# Patient Record
Sex: Male | Born: 1940
Health system: Southern US, Community
[De-identification: ages and names within clinical notes are randomized; demographics above are authoritative.]

## PROBLEM LIST (undated history)

## (undated) ENCOUNTER — Emergency Department (HOSPITAL_BASED_OUTPATIENT_CLINIC_OR_DEPARTMENT_OTHER): Admission: EM | Discharge status: Against Medical Advice | Payer: Self-pay

## (undated) DIAGNOSIS — F32A Depression, unspecified: Secondary | ICD-10-CM

## (undated) DIAGNOSIS — M5030 Other cervical disc degeneration, unspecified cervical region: Secondary | ICD-10-CM

## (undated) DIAGNOSIS — M1990 Unspecified osteoarthritis, unspecified site: Secondary | ICD-10-CM

## (undated) DIAGNOSIS — K219 Gastro-esophageal reflux disease without esophagitis: Secondary | ICD-10-CM

## (undated) DIAGNOSIS — Z9289 Personal history of other medical treatment: Secondary | ICD-10-CM

## (undated) DIAGNOSIS — I4892 Unspecified atrial flutter: Secondary | ICD-10-CM

## (undated) DIAGNOSIS — I10 Essential (primary) hypertension: Secondary | ICD-10-CM

## (undated) DIAGNOSIS — G4700 Insomnia, unspecified: Secondary | ICD-10-CM

## (undated) DIAGNOSIS — R5381 Other malaise: Secondary | ICD-10-CM

## (undated) DIAGNOSIS — J301 Allergic rhinitis due to pollen: Secondary | ICD-10-CM

## (undated) DIAGNOSIS — R9431 Abnormal electrocardiogram [ECG] [EKG]: Secondary | ICD-10-CM

## (undated) DIAGNOSIS — N200 Calculus of kidney: Secondary | ICD-10-CM

## (undated) DIAGNOSIS — K589 Irritable bowel syndrome without diarrhea: Secondary | ICD-10-CM

## (undated) DIAGNOSIS — R21 Rash and other nonspecific skin eruption: Secondary | ICD-10-CM

## (undated) DIAGNOSIS — M62838 Other muscle spasm: Secondary | ICD-10-CM

## (undated) DIAGNOSIS — F329 Major depressive disorder, single episode, unspecified: Secondary | ICD-10-CM

## (undated) DIAGNOSIS — E669 Obesity, unspecified: Secondary | ICD-10-CM

## (undated) DIAGNOSIS — R5383 Other fatigue: Secondary | ICD-10-CM

## (undated) DIAGNOSIS — A0472 Enterocolitis due to Clostridium difficile, not specified as recurrent: Secondary | ICD-10-CM

## (undated) DIAGNOSIS — IMO0002 Reserved for concepts with insufficient information to code with codable children: Secondary | ICD-10-CM

## (undated) DIAGNOSIS — M25579 Pain in unspecified ankle and joints of unspecified foot: Secondary | ICD-10-CM

## (undated) DIAGNOSIS — F411 Generalized anxiety disorder: Secondary | ICD-10-CM

## (undated) DIAGNOSIS — M542 Cervicalgia: Secondary | ICD-10-CM

## (undated) DIAGNOSIS — M25559 Pain in unspecified hip: Secondary | ICD-10-CM

## (undated) DIAGNOSIS — R942 Abnormal results of pulmonary function studies: Secondary | ICD-10-CM

## (undated) DIAGNOSIS — M25519 Pain in unspecified shoulder: Secondary | ICD-10-CM

## (undated) DIAGNOSIS — T7840XA Allergy, unspecified, initial encounter: Secondary | ICD-10-CM

## (undated) DIAGNOSIS — K4090 Unilateral inguinal hernia, without obstruction or gangrene, not specified as recurrent: Secondary | ICD-10-CM

## (undated) DIAGNOSIS — N529 Male erectile dysfunction, unspecified: Secondary | ICD-10-CM

## (undated) DIAGNOSIS — M545 Low back pain: Secondary | ICD-10-CM

## (undated) DIAGNOSIS — M461 Sacroiliitis, not elsewhere classified: Secondary | ICD-10-CM

## (undated) DIAGNOSIS — H9190 Unspecified hearing loss, unspecified ear: Secondary | ICD-10-CM

## (undated) DIAGNOSIS — M25569 Pain in unspecified knee: Secondary | ICD-10-CM

## (undated) DIAGNOSIS — I498 Other specified cardiac arrhythmias: Secondary | ICD-10-CM

## (undated) DIAGNOSIS — K5900 Constipation, unspecified: Secondary | ICD-10-CM

## (undated) DIAGNOSIS — Z125 Encounter for screening for malignant neoplasm of prostate: Secondary | ICD-10-CM

## (undated) DIAGNOSIS — I2510 Atherosclerotic heart disease of native coronary artery without angina pectoris: Secondary | ICD-10-CM

## (undated) DIAGNOSIS — G609 Hereditary and idiopathic neuropathy, unspecified: Secondary | ICD-10-CM

## (undated) HISTORY — DX: Encounter for screening for malignant neoplasm of prostate: Z12.5

## (undated) HISTORY — DX: Major depressive disorder, single episode, unspecified: F32.9

## (undated) HISTORY — DX: Enterocolitis due to Clostridium difficile, not specified as recurrent: A04.72

## (undated) HISTORY — DX: Unspecified osteoarthritis, unspecified site: M19.90

## (undated) HISTORY — DX: Allergy, unspecified, initial encounter: T78.40XA

## (undated) HISTORY — DX: Low back pain: M54.5

## (undated) HISTORY — DX: Pain in unspecified hip: M25.559

## (undated) HISTORY — DX: Depression, unspecified: F32.A

## (undated) HISTORY — DX: Other cervical disc degeneration, unspecified cervical region: M50.30

## (undated) HISTORY — DX: Other fatigue: R53.83

## (undated) HISTORY — DX: Personal history of other medical treatment: Z92.89

## (undated) HISTORY — DX: Atherosclerotic heart disease of native coronary artery without angina pectoris: I25.10

## (undated) HISTORY — DX: Irritable bowel syndrome, unspecified: K58.9

## (undated) HISTORY — DX: Abnormal results of pulmonary function studies: R94.2

## (undated) HISTORY — PX: CARDIAC CATHETERIZATION: SHX172

## (undated) HISTORY — DX: Cervicalgia: M54.2

## (undated) HISTORY — DX: Rash and other nonspecific skin eruption: R21

## (undated) HISTORY — DX: Pain in unspecified knee: M25.569

## (undated) HISTORY — DX: Pain in unspecified shoulder: M25.519

## (undated) HISTORY — PX: TONSILLECTOMY: SUR1361

## (undated) HISTORY — DX: Insomnia, unspecified: G47.00

## (undated) HISTORY — PX: OTHER SURGICAL HISTORY: SHX169

## (undated) HISTORY — DX: Pain in unspecified ankle and joints of unspecified foot: M25.579

## (undated) HISTORY — DX: Reserved for concepts with insufficient information to code with codable children: IMO0002

## (undated) HISTORY — DX: Generalized anxiety disorder: F41.1

## (undated) HISTORY — DX: Sacroiliitis, not elsewhere classified: M46.1

## (undated) HISTORY — DX: Hereditary and idiopathic neuropathy, unspecified: G60.9

## (undated) HISTORY — DX: Essential (primary) hypertension: I10

## (undated) HISTORY — DX: Obesity, unspecified: E66.9

## (undated) HISTORY — DX: Male erectile dysfunction, unspecified: N52.9

## (undated) HISTORY — DX: Calculus of kidney: N20.0

## (undated) HISTORY — DX: Other specified cardiac arrhythmias: I49.8

## (undated) HISTORY — DX: Unspecified atrial flutter: I48.92

## (undated) HISTORY — PX: RENAL CYST EXCISION: SUR506

## (undated) HISTORY — DX: Unspecified hearing loss, unspecified ear: H91.90

## (undated) HISTORY — PX: APPENDECTOMY: SHX54

## (undated) HISTORY — DX: Other muscle spasm: M62.838

## (undated) HISTORY — DX: Unilateral inguinal hernia, without obstruction or gangrene, not specified as recurrent: K40.90

## (undated) HISTORY — DX: Abnormal electrocardiogram (ECG) (EKG): R94.31

## (undated) HISTORY — DX: Constipation, unspecified: K59.00

## (undated) HISTORY — DX: Gastro-esophageal reflux disease without esophagitis: K21.9

## (undated) HISTORY — DX: Allergic rhinitis due to pollen: J30.1

## (undated) HISTORY — DX: Other malaise: R53.81

## (undated) HISTORY — PX: TOTAL HIP ARTHROPLASTY: SHX124

---

## 1967-06-01 HISTORY — PX: SPINE SURGERY: SHX786

## 1987-06-01 HISTORY — PX: POSTERIOR LUMBAR FUSION: SHX6036

## 1997-09-02 ENCOUNTER — Other Ambulatory Visit: Admission: RE | Admit: 1997-09-02 | Discharge: 1997-09-02 | Payer: Self-pay

## 1997-10-01 ENCOUNTER — Other Ambulatory Visit: Admission: RE | Admit: 1997-10-01 | Discharge: 1997-10-01 | Payer: Self-pay

## 1999-08-31 ENCOUNTER — Encounter: Payer: Self-pay | Admitting: Emergency Medicine

## 1999-08-31 ENCOUNTER — Inpatient Hospital Stay (HOSPITAL_COMMUNITY): Admission: EM | Admit: 1999-08-31 | Discharge: 1999-09-01 | Payer: Self-pay | Admitting: Emergency Medicine

## 2002-05-31 HISTORY — PX: CARPAL TUNNEL WITH CUBITAL TUNNEL: SHX5608

## 2004-07-17 ENCOUNTER — Inpatient Hospital Stay (HOSPITAL_COMMUNITY): Admission: EM | Admit: 2004-07-17 | Discharge: 2004-07-22 | Payer: Self-pay | Admitting: *Deleted

## 2005-03-01 ENCOUNTER — Encounter: Admission: RE | Admit: 2005-03-01 | Discharge: 2005-03-01 | Payer: Self-pay | Admitting: Internal Medicine

## 2005-05-26 ENCOUNTER — Encounter: Admission: RE | Admit: 2005-05-26 | Discharge: 2005-05-26 | Payer: Self-pay | Admitting: *Deleted

## 2005-07-24 ENCOUNTER — Emergency Department (HOSPITAL_COMMUNITY): Admission: EM | Admit: 2005-07-24 | Discharge: 2005-07-24 | Payer: Self-pay | Admitting: Emergency Medicine

## 2008-05-17 ENCOUNTER — Emergency Department (HOSPITAL_COMMUNITY): Admission: EM | Admit: 2008-05-17 | Discharge: 2008-05-17 | Payer: Self-pay | Admitting: Emergency Medicine

## 2008-06-06 ENCOUNTER — Ambulatory Visit (HOSPITAL_COMMUNITY): Admission: RE | Admit: 2008-06-06 | Discharge: 2008-06-06 | Payer: Self-pay | Admitting: *Deleted

## 2008-06-06 HISTORY — PX: ESOPHAGOGASTRODUODENOSCOPY: SHX1529

## 2008-06-06 HISTORY — PX: COLONOSCOPY: SHX174

## 2008-07-25 ENCOUNTER — Emergency Department (HOSPITAL_COMMUNITY): Admission: EM | Admit: 2008-07-25 | Discharge: 2008-07-25 | Payer: Self-pay | Admitting: Emergency Medicine

## 2008-11-04 ENCOUNTER — Encounter: Admission: RE | Admit: 2008-11-04 | Discharge: 2008-11-04 | Payer: Self-pay | Admitting: Internal Medicine

## 2008-11-24 ENCOUNTER — Emergency Department (HOSPITAL_BASED_OUTPATIENT_CLINIC_OR_DEPARTMENT_OTHER): Admission: EM | Admit: 2008-11-24 | Discharge: 2008-11-24 | Payer: Self-pay | Admitting: Emergency Medicine

## 2008-11-24 ENCOUNTER — Ambulatory Visit: Payer: Self-pay | Admitting: Diagnostic Radiology

## 2009-05-09 ENCOUNTER — Encounter: Admission: RE | Admit: 2009-05-09 | Discharge: 2009-05-09 | Payer: Self-pay | Admitting: Internal Medicine

## 2009-05-27 ENCOUNTER — Encounter: Admission: RE | Admit: 2009-05-27 | Discharge: 2009-05-27 | Payer: Self-pay | Admitting: Internal Medicine

## 2009-07-25 ENCOUNTER — Ambulatory Visit (HOSPITAL_COMMUNITY): Admission: RE | Admit: 2009-07-25 | Discharge: 2009-07-25 | Payer: Self-pay | Admitting: Gastroenterology

## 2010-05-29 ENCOUNTER — Encounter
Admission: RE | Admit: 2010-05-29 | Discharge: 2010-05-29 | Payer: Self-pay | Source: Home / Self Care | Attending: Internal Medicine | Admitting: Internal Medicine

## 2010-06-05 ENCOUNTER — Encounter
Admission: RE | Admit: 2010-06-05 | Discharge: 2010-06-05 | Payer: Self-pay | Source: Home / Self Care | Attending: Orthopaedic Surgery | Admitting: Orthopaedic Surgery

## 2010-06-21 ENCOUNTER — Encounter: Payer: Self-pay | Admitting: Internal Medicine

## 2010-06-21 ENCOUNTER — Encounter: Payer: Self-pay | Admitting: Gastroenterology

## 2010-07-01 ENCOUNTER — Encounter: Payer: Self-pay | Admitting: Internal Medicine

## 2010-08-09 ENCOUNTER — Emergency Department (HOSPITAL_COMMUNITY): Payer: Medicare Other

## 2010-08-09 ENCOUNTER — Inpatient Hospital Stay (HOSPITAL_COMMUNITY)
Admission: EM | Admit: 2010-08-09 | Discharge: 2010-08-12 | DRG: 373 | Disposition: A | Discharge status: Home / Self Care | Payer: Medicare Other | Attending: Internal Medicine | Admitting: Internal Medicine

## 2010-08-09 ENCOUNTER — Inpatient Hospital Stay (HOSPITAL_COMMUNITY): Payer: Medicare Other

## 2010-08-09 DIAGNOSIS — R9431 Abnormal electrocardiogram [ECG] [EKG]: Secondary | ICD-10-CM | POA: Diagnosis present

## 2010-08-09 DIAGNOSIS — H9190 Unspecified hearing loss, unspecified ear: Secondary | ICD-10-CM | POA: Diagnosis present

## 2010-08-09 DIAGNOSIS — Z79899 Other long term (current) drug therapy: Secondary | ICD-10-CM

## 2010-08-09 DIAGNOSIS — I10 Essential (primary) hypertension: Secondary | ICD-10-CM | POA: Diagnosis present

## 2010-08-09 DIAGNOSIS — K219 Gastro-esophageal reflux disease without esophagitis: Secondary | ICD-10-CM | POA: Diagnosis present

## 2010-08-09 DIAGNOSIS — E860 Dehydration: Secondary | ICD-10-CM | POA: Diagnosis present

## 2010-08-09 DIAGNOSIS — E785 Hyperlipidemia, unspecified: Secondary | ICD-10-CM | POA: Diagnosis present

## 2010-08-09 DIAGNOSIS — A0472 Enterocolitis due to Clostridium difficile, not specified as recurrent: Principal | ICD-10-CM | POA: Diagnosis present

## 2010-08-09 DIAGNOSIS — M1990 Unspecified osteoarthritis, unspecified site: Secondary | ICD-10-CM | POA: Diagnosis present

## 2010-08-09 LAB — URINALYSIS, ROUTINE W REFLEX MICROSCOPIC
Bilirubin Urine: NEGATIVE
Glucose, UA: NEGATIVE mg/dL
Hgb urine dipstick: NEGATIVE
Protein, ur: NEGATIVE mg/dL
Urobilinogen, UA: 0.2 mg/dL (ref 0.0–1.0)

## 2010-08-09 LAB — COMPREHENSIVE METABOLIC PANEL
AST: 31 U/L (ref 0–37)
BUN: 18 mg/dL (ref 6–23)
CO2: 25 mEq/L (ref 19–32)
Calcium: 9.3 mg/dL (ref 8.4–10.5)
Chloride: 107 mEq/L (ref 96–112)
Creatinine, Ser: 1.3 mg/dL (ref 0.4–1.5)
GFR calc Af Amer: >60 mL/min (ref >60)
GFR calc non Af Amer: 55 mL/min — ABNORMAL LOW (ref >60)
Total Bilirubin: 0.8 mg/dL (ref 0.3–1.2)

## 2010-08-09 LAB — DIFFERENTIAL
Basophils Relative: 0 % (ref 0–1)
Eosinophils Absolute: 0.1 10*3/uL (ref 0.0–0.7)
Lymphs Abs: 0.3 10*3/uL — ABNORMAL LOW (ref 0.7–4.0)
Monocytes Absolute: 0.6 10*3/uL (ref 0.1–1.0)
Monocytes Relative: 5 % (ref 3–12)

## 2010-08-09 LAB — CBC
Hemoglobin: 13.9 g/dL (ref 13.0–17.0)
MCH: 29.8 pg (ref 26.0–34.0)
MCHC: 32.5 g/dL (ref 30.0–36.0)
MCV: 91.8 fL (ref 78.0–100.0)
Platelets: 182 10*3/uL (ref 150–400)

## 2010-08-10 ENCOUNTER — Inpatient Hospital Stay (HOSPITAL_COMMUNITY): Payer: Medicare Other

## 2010-08-10 LAB — MAGNESIUM: Magnesium: 2 mg/dL (ref 1.5–2.5)

## 2010-08-10 LAB — COMPREHENSIVE METABOLIC PANEL
Alkaline Phosphatase: 59 U/L (ref 39–117)
BUN: 20 mg/dL (ref 6–23)
GFR calc non Af Amer: 53 mL/min — ABNORMAL LOW (ref >60)
Glucose, Bld: 97 mg/dL (ref 70–99)
Potassium: 3.6 mEq/L (ref 3.5–5.1)
Total Bilirubin: 0.8 mg/dL (ref 0.3–1.2)
Total Protein: 5.6 g/dL — ABNORMAL LOW (ref 6.0–8.3)

## 2010-08-10 LAB — LIPID PANEL
Cholesterol: 123 mg/dL (ref 0–200)
LDL Cholesterol: 66 mg/dL (ref 0–99)
Total CHOL/HDL Ratio: 2.8 RATIO

## 2010-08-10 LAB — CARDIAC PANEL(CRET KIN+CKTOT+MB+TROPI)
Relative Index: INVALID (ref 0.0–2.5)
Total CK: 58 U/L (ref 7–232)

## 2010-08-10 LAB — CBC
HCT: 37.8 % — ABNORMAL LOW (ref 39.0–52.0)
Hemoglobin: 12.3 g/dL — ABNORMAL LOW (ref 13.0–17.0)
MCH: 29.7 pg (ref 26.0–34.0)
MCHC: 32.5 g/dL (ref 30.0–36.0)

## 2010-08-10 LAB — DIFFERENTIAL
Basophils Absolute: 0 10*3/uL (ref 0.0–0.1)
Lymphocytes Relative: 3 % — ABNORMAL LOW (ref 12–46)
Lymphs Abs: 0.2 10*3/uL — ABNORMAL LOW (ref 0.7–4.0)
Monocytes Absolute: 0.4 10*3/uL (ref 0.1–1.0)
Monocytes Relative: 6 % (ref 3–12)
Neutro Abs: 6.4 10*3/uL (ref 1.7–7.7)

## 2010-08-10 LAB — HEMOGLOBIN A1C: Mean Plasma Glucose: 120 mg/dL — ABNORMAL HIGH (ref <117)

## 2010-08-10 LAB — PHOSPHORUS: Phosphorus: 2.8 mg/dL (ref 2.3–4.6)

## 2010-08-10 LAB — GLUCOSE, CAPILLARY: Glucose-Capillary: 101 mg/dL — ABNORMAL HIGH (ref 70–99)

## 2010-08-11 LAB — OVA AND PARASITE EXAMINATION: Ova and parasites: NONE SEEN

## 2010-08-11 LAB — BASIC METABOLIC PANEL
Calcium: 7.6 mg/dL — ABNORMAL LOW (ref 8.4–10.5)
Creatinine, Ser: 1.07 mg/dL (ref 0.4–1.5)
GFR calc Af Amer: >60 mL/min (ref >60)
GFR calc non Af Amer: >60 mL/min (ref >60)

## 2010-08-11 LAB — FECAL LACTOFERRIN, QUANT

## 2010-08-11 LAB — CLOSTRIDIUM DIFFICILE BY PCR

## 2010-08-12 LAB — COMPREHENSIVE METABOLIC PANEL
Albumin: 2.9 g/dL — ABNORMAL LOW (ref 3.5–5.2)
Alkaline Phosphatase: 51 U/L (ref 39–117)
BUN: 8 mg/dL (ref 6–23)
Calcium: 7.9 mg/dL — ABNORMAL LOW (ref 8.4–10.5)
Glucose, Bld: 110 mg/dL — ABNORMAL HIGH (ref 70–99)
Potassium: 3.2 mEq/L — ABNORMAL LOW (ref 3.5–5.1)
Total Protein: 5 g/dL — ABNORMAL LOW (ref 6.0–8.3)

## 2010-08-12 LAB — DIFFERENTIAL
Basophils Absolute: 0 10*3/uL (ref 0.0–0.1)
Eosinophils Absolute: 0 10*3/uL (ref 0.0–0.7)
Eosinophils Relative: 1 % (ref 0–5)
Lymphs Abs: 0.8 10*3/uL (ref 0.7–4.0)
Monocytes Absolute: 0.8 10*3/uL (ref 0.1–1.0)

## 2010-08-12 LAB — CBC
MCHC: 32.5 g/dL (ref 30.0–36.0)
MCV: 90.5 fL (ref 78.0–100.0)
Platelets: 115 10*3/uL — ABNORMAL LOW (ref 150–400)
RDW: 13.6 % (ref 11.5–15.5)
WBC: 6.4 10*3/uL (ref 4.0–10.5)

## 2010-08-12 LAB — MAGNESIUM: Magnesium: 2 mg/dL (ref 1.5–2.5)

## 2010-08-14 LAB — STOOL CULTURE

## 2010-08-18 NOTE — Discharge Summary (Signed)
[name] [name], FERGUSSON                 ACCOUNT NO.:  [id number]  MEDICAL RECORD NO.:  [id number]           PATIENT TYPE:  I  LOCATION:  1509                         FACILITY:  [facility]  PHYSICIAN:  Erick Blinks, MD     DATE OF BIRTH:  December 02, 1940  DATE OF ADMISSION:  08/09/2010 DATE OF DISCHARGE:  08/12/2010                              DISCHARGE SUMMARY   PRIMARY CARE PHYSICIAN:  Lenon Curt. Chilton Si, M.D.  DISCHARGE DIAGNOSES: 1. Clostridium difficile colitis, improved. 2. Diarrhea secondary to Clostridium difficile colitis, improved. 3. Dehydration, improved. 4. Hypertension, stable. 5. Gastroesophageal reflux disease. 6. Osteoarthritis.  DISCHARGE MEDICATIONS: 1. Florastor 250 mg by mouth twice daily. 2. Metronidazole 500 mg by mouth every 8 hours. 3. Amitiza 224 mcg 1 capsule by mouth daily. 4. Amlodipine/valsartan 10/320 mg 1 tablet by mouth daily at bedtime. 5. Multivitamins 1 tablet by mouth daily. 6. Nexium 40 mg 1 tablet by mouth every morning as needed. 7. Tekturna hydrochlorothiazide 150 mg/12.5 mg 1 tablet by mouth     daily. 8. Tylox 5/500 mg 1 capsule by mouth 4 times daily as needed. 9. Vitamin C 500 mg 1 tablet by mouth daily.  ADMISSION HISTORY:  This is a 70 year old gentleman who was admitted to the hospital with nausea and vomiting.  The patient lives at home with his wife, but his wife was recently in the hospital and he spent significant amount of time with her here.  He presents to the hospital with vomiting total of 5 times as well as continuous nausea.  He was also having diarrhea.  The patient was subsequently admitted for further evaluation.  For further details, please refer to the history and physical dictated by Dr. Adela Glimpse on August 09, 2010.  HOSPITAL COURSE: 1. Diarrhea.  The patient's stool tested positive for C diff.  Culture     and ova and parasites were found to be negative.  He was started on     Flagyl.  He was also started on Florastor.   His symptoms have     significantly improved.  He is no longer having any nausea,     vomiting, diarrhea, or abdominal pain.  He is tolerating p.o. diet     and he is not febrile.  He wishes to be discharged home today which     appears to be appropriate, and he will follow up with his primary     care physician. 2. Remainder of the patient's chronic medical issues remained stable.  CONSULTATIONS:  None.  PROCEDURES:  None.  DIAGNOSTIC IMAGING: 1. Acute abdominal series on August 09, 2010, shows no acute     cardiopulmonary disease, nonspecific nonobstructive bowel gas     pattern. 2. CT of the head without contrast on August 10, 2010, shows no acute     or focal brain pathology, chronic ventriculomegaly not changed     since May 09, 2009.  This is probably due to central atrophy.     The possibility of normal pressure hydrocephalus does exist, but is     not strongly suggested.  DISCHARGE INSTRUCTIONS:  The patient  should continue on a heart-healthy diet, conduct his activity as tolerated.  Follow up with his primary care physician within the next 1 week.  CONDITION AT TIME OF DISCHARGE:  Improved.  TIME SPENT:  30 minutes.     Erick Blinks, MD     JM/MEDQ  D:  08/12/2010  T:  08/12/2010  Job:  [id number]  cc:   Lenon Curt. Chilton Si, M.D. Fax: [phone]  Electronically Signed by Erick Blinks  on 08/18/2010 03:15:04 PM

## 2010-08-27 NOTE — H&P (Signed)
NAME:  [name] [name], [name] [name]                 ACCOUNT NO.:  [id number]  MEDICAL RECORD NO.:  [id number]           PATIENT TYPE:  E  LOCATION:  WLED                         FACILITY:  [facility]  PHYSICIAN:  Michiel Cowboy, MDDATE OF BIRTH:  02-18-1941  DATE OF ADMISSION:  08/09/2010 DATE OF DISCHARGE:                             HISTORY & PHYSICAL   PRIMARY CARE PROVIDER:  Murray Hodgkins, MD  CHIEF COMPLAINT:  Nausea and vomiting.  HISTORY OF PRESENT ILLNESS:  The patient is a 70 year old gentleman with a past history of hypertension, hyperlipidemia.  He lives at home with his wife.  He ate pizza this morning, after that started to have nausea and vomiting.  He states he has vomited a total of five times and continues to be nauseous.  He might have had a little bit of mild headache associated with his.  No abdominal pain.  No diarrhea.  No fever, although he feels like he might have had a little bit of chills. No chest pains or shortness of breath.  Vomitus is yellow in color.  He has poor ability to tolerate p.o.'s  When he stands up, he feels lightheaded, no vertigo.  No focal neurological complaints.  Otherwise review of systems is unremarkable.  PAST MEDICAL HISTORY:  Significant for hypertension, hyperlipidemia, history of arthritis.  The patient has partial deafness.  PAST SURGICAL HISTORY:  He has had multiple surgeries including appendectomy, and tonsillectomy, and renal cyst removed.  SOCIAL HISTORY:  The patient denies smoking or alcohol or drug abuse. Lives at home with his wife, who has recently had a gall bladder surgery from which she is recovering, and nobody else is sick in the house.  FAMILY HISTORY:  Noncontributory.  ALLERGIES:  None.  MEDICATIONS:  The patient states he takes [place], unknown dose daily, and Exforge, unknown dose daily.  Oxycodone is needed for pain and prescription dose ibuprofen for his arthritis.  PHYSICAL EXAMINATION:  VITAL SIGNS:   Temperature 98.8, blood pressure 134/67, pulse 74, respirations 19, saturating 100% on room air. GENERAL:  The patient appears to be in no acute distress.  Decreased skin turgor.  HEENT:  Head nontraumatic.  Dry mucous membranes.  LUNGS: Clear to auscultation bilaterally.  HEART:  Regular rate and rhythm.  No murmurs appreciated.  ABDOMEN:  Soft, nontender, nondistended.  LOWER EXTREMITIES: Without clubbing, cyanosis or edema.  NEUROLOGIC:  Strength 5/5 in all four extremities. Cranial nerves II-XII intact. Otherwise no other neurological deficits noted.  PERTINENT LABORATORY AND X-RAY DATA:  White blood cell count 11.3, hemoglobin 13.9.  Sodium 139, potassium 4.0, creatinine 1.3.  Total protein 6.8, lipase 25.  Alcohol level is 65.  UA unremarkable.  Chest x- ray and KUB unremarkable.  EKG is showing sinus rhythm, heart rate of 71, third-degree AV block.  No old EKG available.  There is some generalized T-wave flattening and inversion in lead V1.  ASSESSMENT AND PLAN:  This is a 70 year old gentleman with acute onset of nausea, vomiting, of unclear etiology.  Hopefully, this is a viral gastritis which would be easily treated.  Will admit, give IV fluids, treat  symptomatically with Zofran as needed, and Phenergan if Zofran does not help.  Advance diet slowly.  His KUB was unremarkable.  Given mild headache, for completion we will obtain CT scan of the head to rule out central cause of nausea, especially given the fact that he has no associated diarrhea or abdominal discomfort.  Given abnormal EKG, will cycle cardiac markers.  Mild dehydration:  Given his lightheadedness when he stands up, the patient may be orthostatic.  Will give IV fluids and check orthostatics in the morning.  We will follow his creatinine.  Slightly elevated white blood cell count:  This could be hemoconcentration.  We will continue to monitor for any signs of infection.  Hypertension:  Continue Tekturna at  150, institute a lower dose until we can figure out which medications is he actually on.  Will also continue amlodipine at 5 for right now.  Hopefully, I will be able to his obtain his medical records further to clarify what medicines is he taking.     Michiel Cowboy, MD     AVD/MEDQ  D:  08/09/2010  T:  08/09/2010  Job:  [id number]  cc:   Lenon Curt. Chilton Si, M.D. Fax: [phone]  Electronically Signed by Therisa Doyne MD on 08/27/2010 02:39:07 AM

## 2010-09-15 LAB — URINALYSIS, ROUTINE W REFLEX MICROSCOPIC
Glucose, UA: NEGATIVE mg/dL
Ketones, ur: NEGATIVE mg/dL
Nitrite: NEGATIVE
Specific Gravity, Urine: 1.009 (ref 1.005–1.030)
pH: 7 (ref 5.0–8.0)

## 2010-09-15 LAB — DIFFERENTIAL
Basophils Relative: 1 % (ref 0–1)
Monocytes Absolute: 0.3 10*3/uL (ref 0.1–1.0)
Monocytes Relative: 5 % (ref 3–12)
Neutro Abs: 5.1 10*3/uL (ref 1.7–7.7)

## 2010-09-15 LAB — COMPREHENSIVE METABOLIC PANEL
ALT: 28 U/L (ref 0–53)
AST: 32 U/L (ref 0–37)
Albumin: 4.8 g/dL (ref 3.5–5.2)
Alkaline Phosphatase: 78 U/L (ref 39–117)
BUN: 16 mg/dL (ref 6–23)
GFR calc Af Amer: >60 mL/min (ref >60)
Potassium: 3.8 mEq/L (ref 3.5–5.1)
Sodium: 133 mEq/L — ABNORMAL LOW (ref 135–145)
Total Protein: 6.7 g/dL (ref 6.0–8.3)

## 2010-09-15 LAB — CBC
Platelets: 169 10*3/uL (ref 150–400)
RDW: 13.6 % (ref 11.5–15.5)

## 2010-10-13 NOTE — Op Note (Signed)
[name] [name], [name] [name] NO.:  [id number]   MEDICAL RECORD NO.:  [id number]          PATIENT TYPE:  AMB   LOCATION:  ENDO                         FACILITY:  [facility]   PHYSICIAN:  Georgiana Spinner, M.D.    DATE OF BIRTH:  08-05-1940   DATE OF PROCEDURE:  DATE OF DISCHARGE:                               OPERATIVE REPORT   ADDENDUM:  Carbon copy to Dr. Dorothea Ogle as well.           ______________________________  Georgiana Spinner, M.D.     GMO/MEDQ  D:  06/06/2008  T:  06/06/2008  Job:  [id number]   cc:   Lenon Curt. Chilton Si, M.D.  Fax: [phone]

## 2010-10-13 NOTE — Op Note (Signed)
NAMEDEX, BLAKELY NO.:  [id number]   MEDICAL RECORD NO.:  [id number]          PATIENT TYPE:  AMB   LOCATION:  ENDO                         FACILITY:  [facility]   PHYSICIAN:  Georgiana Spinner, M.D.    DATE OF BIRTH:  08-29-1940   DATE OF PROCEDURE:  06/06/2008  DATE OF DISCHARGE:                               OPERATIVE REPORT   PROCEDURE:  Upper endoscopy.   INDICATIONS:  Gastroesophageal reflux disease, hemoccult positivity.   ANESTHESIA:  Fentanyl 50 mcg, Versed 5 mg.   PROCEDURE:  With the patient mildly sedated in the left lateral  decubitus position the Pentax videoscopic endoscope was inserted in the  mouth, passed under direct vision through the esophagus which appeared  normal into the stomach fundus, body, antrum, duodenal bulb, second  portion duodenum were visualized appeared normal.  From this point the  endoscope was slowly withdrawn taking circumferential views of duodenal  mucosa until the endoscope was pulled back and the stomach placed in  retroflexion to view the stomach from below. The endoscope was  straightened and withdrawn taking circumferential views of the remaining  gastric and esophageal mucosa.  The patient's vital signs, pulse  oximeter remained stable.  The patient tolerated procedure well without  apparent complications.   FINDINGS:  Unremarkable examination.   PLAN:  Proceed to colonoscopy.           ______________________________  Georgiana Spinner, M.D.     GMO/MEDQ  D:  06/06/2008  T:  06/06/2008  Job:  [id number]   cc:   Lenon Curt. Chilton Si, M.D.  Fax: [phone]

## 2010-10-13 NOTE — Op Note (Signed)
[name] [name], [name] [name] NO.:  [id number]   MEDICAL RECORD NO.:  [id number]          PATIENT TYPE:  AMB   LOCATION:  ENDO                         FACILITY:  [facility]   PHYSICIAN:  Georgiana Spinner, M.D.    DATE OF BIRTH:  08-Feb-1941   DATE OF PROCEDURE:  06/06/2008  DATE OF DISCHARGE:                               OPERATIVE REPORT   PROCEDURE:  Colonoscopy.   INDICATIONS:  Colon cancer screening.   ANESTHESIA:  Fentanyl 50 mcg, Versed 5 mg.   PROCEDURE:  With the patient mildly sedated in the left lateral  decubitus position, a rectal exam was performed which was unremarkable  to my exam.  Subsequently the Pentax videoscopic colonoscope was  inserted into the rectum and passed under direct vision. With the  patient turned to his back we were able to get through a very tortuous,  significantly diverticula filled sigmoid colon and subsequently reach  the cecum, identified by ileocecal valve and appendiceal orifice, both  of which were photographed. From this point the colonoscope was slowly  withdrawn taking circumferential views of colonic mucosa, stopping only  down in the rectum which appeared normal on direct and showed  hemorrhoids on retroflexed view. The endoscope was straightened and  withdrawn.  The patient's vital signs, pulse oximeter remained stable.  The patient tolerated procedure well without apparent complications.   FINDINGS:  Significant marked diverticulosis of sigmoid colon with  tortuosity in that area with one sharp turn noted and internal  hemorrhoids were also noted.   PLAN:  Repeat examination in possibly 5-10 years.           ______________________________  Georgiana Spinner, M.D.     GMO/MEDQ  D:  06/06/2008  T:  06/06/2008  Job:  [id number]

## 2010-10-16 NOTE — Cardiovascular Report (Signed)
[place]. [facility]  Patient:    [name] [name], [name] [name]                          MRN: [id number] Proc. Date: 09/01/99 Adm. Date:  [id number] Attending:  Clovis Cao CC:         Jaclyn Prime. Lucas Mallow, M.D.             Cath Lab                        Cardiac Catheterization  PROCEDURE:                    Left heart catheterization, coronary cine angiography, left ventricular cine angiography, abdominal aortogram.  INDICATIONS:                  This 71 year old male was admitted to the hospital after the onset of anterior chest pain and a treadmill exercise tolerance test as early positive with ST segment depression and a severe hypertensive response with blood pressure greater than 250 and stage I Bruce protocol.  He was then admitted for stabilization of his blood pressure and scheduled for cardiac catheterization because of his positive ST segment changes.  DESCRIPTION OF PROCEDURE:     After signing an informed consent, the patient was premedicated with 50 mg of Benadryl intravenously and brought to the cardiac catheterization lab.  His right groin was prepped and draped in a sterile fashion and anesthetized locally with 1% lidocaine.  A 6 French introducer sheath was inserted percutaneously into the right femoral artery.  A 6 French #4 Judkins coronary catheters were used to make injections into the coronary arteries.  A  French pigtail catheter was used to measure pressures in the left ventricle and  aorta and to make midstream injections into the left ventricle and abdominal aorta. The patient tolerated the procedure well and no complications were noted at the end of the procedure.  The catheter and sheath were removed from the right femoral artery and hemostasis was easily obtained with standard pressure technique after a Perclose closure system failed to cross the puncture site.  The patient was then returned to his room in satisfactory  condition.  MEDICATIONS GIVEN:            None.  CATHETERIZATION DATA:         Left ventricular pressure 148/5 to 22, aortic pressure 144/71 with a mean of 95.  Left ventricular ejection fraction was estimated at 60%.  CINE FINDINGS:                Coronary cine angiography;  Left coronary artery;  The ostium and left main appeared normal.  Left anterior descending;  The LAD appears normal.  There is a large bifurcation in the middle segment which supplies a large septal division and a large anterolateral division, both distributions appear normal.  Circumflex coronary artery;  The circumflex appears normal and supplies two large obtuse marginal branches.  Right coronary artery;  The ostium appears normal.  The right coronary artery is a large dominant vessel supplying the posterior descending and posterolateral circulation.  The entire right coronary system appears normal.  Left ventricular cine angiogram;  The left ventricular chamber size is mildly enlarged.  The left ventricular wall thickness appears normal.  The left ventricular contractility pattern is normal without segmental abnormality.  The  mitral and aortic valves appear  normal.  There is no mitral insufficiency or calcification noted.  Abdominal aortogram;  Midstream injection into the abdominal aorta shows a moderately tortuous abdominal aorta which otherwise appears normal.  There is no evidence for significant atherosclerotic plaque or calcification.  The renal arteries appear normal.  The common iliac arteries are also moderately tortuous, but also appear normal otherwise and without plaque noted.  FINAL DIAGNOSES: 1. Normal coronary arteries. 2. Normal left ventricular function. 3. Moderately tortuous abdominal aorta, but otherwise with normal appearance and    without plaque in the aorta, iliac arteries, and renal arteries. 4. Unsuccessful attempt to Perclose with failure to advance the device  through    the puncture site.  DISPOSITION:                  Will return to telemetry for further monitoring, o monitor his heart rate and blood pressure.  His heart rate was noted to be in the 40s during the procedure.  Also notify Jaclyn Prime. Lucas Mallow, M.D. of the results. DD:  09/01/99 TD:  09/01/99 Job: [id number]

## 2010-10-16 NOTE — Discharge Summary (Signed)
[name], [name] NO.:  [id number]   MEDICAL RECORD NO.:  [id number]          PATIENT TYPE:  INP   LOCATION:  5032                         FACILITY:  [facility]   PHYSICIAN:  Lonia Blood, M.D.DATE OF BIRTH:  06/03/1940   DATE OF ADMISSION:  07/17/2004  DATE OF DISCHARGE:  07/22/2004                                 DISCHARGE SUMMARY   DISCHARGE DIAGNOSES:  1.  Diverticulosis with acute diverticulitis.      1.  CT scan revealing no evidence of abscess.      2.  Follow-up CT scan confirming improvement.      3.  Tolerating p.o. Cipro and Flagyl, titrated over from intravenous          dosing.  2.  Obesity.  3.  Hypertension.  4.  Partial deafness.  5.  Known right renal cyst.  6.  Degenerative disk disease.  7.  History of nephrolithiasis.  8.  Status post tonsillectomy.  9.  Status post appendectomy.  10. Right kidney operation, 1960s, of unknown nature.  11. Bilateral hip replacements in 1999.  12. Spinal fusion, 1969-level and indication unknown.   DISCHARGE MEDICATIONS:  1.  Toprol-XL 100 mg p.o. daily.  2.  Cipro 500 mg p.o. b.i.d. for five days then stop.  3.  Flagyl 500 mg p.o. t.i.d. for five days then stop.  4.  Vicodin 5/500 1 tablet q.4 h. p.r.n. pain.   FOLLOW UP:  The patient is instructed to call [name] [name], M.D. for  followup in the outpatient setting.  Recheck of blood pressure is  recommended at that time, and a second blood pressure agent will likely be  needed.   CONSULTATIONS:  None.   IMAGING:  CT scan of the abdomen and pelvis x2 - These scans confirmed the  presence of a left lower quadrant diverticulitis and confirmed the absence  of an abscess or further complication.   HISTORY OF PRESENT ILLNESS:  [name] is a 70 year old gentleman who  was admitted to the hospital on July 17, 2004 when he presented to the  emergency room in the evening hours complaining of severe left lower  quadrant and midabdominal  pain.  This was noted to be quite intense and  associated with nausea and vomiting to the extent that the patient was  unable to tolerate any p.o. intake of fluids of solids.   HOSPITAL COURSE:  [name] was admitted to the hospital for acute abdominal  pain.  Evaluation included a CT scan of the abdomen and pelvis which  revealed a left colon diverticulitis.  There was no evidence of diverticular  abscess.  The patient's pain was very hard to control.  He required IV  medications for both nausea as well as pain.  He was dosed initially with IV  Cipro and Flagyl which he tolerated without difficulty.  As the patient's  symptoms improved, he was titrated to p.o. antibiotics.  He tolerated this  without difficulty.  Diet was advanced, and he tolerated this well.  The  abdomen became much more tender  on exam, and the patient was deemed to be  stable for discharge by July 22, 2004.   Of note, [name] was greatly interested in weight loss as well as exercise  regimens during his hospital stay.  Nutritional consult was carried out, and  they advised the patient on an appropriate balanced diet.  Physical therapy  consult was accomplished, and they advised the patient on an appropriate  exercise regimen for him to begin to lose weight.  The patient was counseled  extensively as to the connection between his obesity and health risks for  the future.  He was told in no uncertain terms that he should become very  active in losing weight and obtaining a healthier size.   The patient does have a long-standing history of hypertension.  He had  previously been dosed with Toprol-XL at 50 mg daily.  Blood pressure was  poorly controlled during this hospitalization.  Clonidine was added in the  short term.  It is felt, however, that the patient's acute elevation in  hypertension was likely secondary to his pain and acute illness.  At the  time of discharge, a decision is made to increase his  Toprol-XL to 100 mg  daily.  It is quite possible that the patient will need a second regimen if  blood pressure continues to be elevated in the outpatient setting.   On July 22, 2004, the patient was deemed to be stable for discharge.  He  was tolerating a full diet.  Antibiotics had been tapered to p.o., and he  was tolerating these without difficulty.  Followup is as detailed above.      JTM/MEDQ  D:  07/22/2004  T:  07/22/2004  Job:  [id number]
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
## 2010-10-16 NOTE — H&P (Signed)
[name] [name] [name], [name] [name] NO.:  [id number]   MEDICAL RECORD NO.:  [id number]          PATIENT TYPE:  INP   LOCATION:  5032                         FACILITY:  [facility]   PHYSICIAN:  Maxwell Caul, M.D.DATE OF BIRTH:  09/13/1940   DATE OF ADMISSION:  07/17/2004  DATE OF DISCHARGE:                                HISTORY & PHYSICAL   ID:  This is a 70 year old outpatient of Dr. Thomasene Lot.   CHIEF COMPLAINT:  Abdominal pain since last night.   HISTORY OF PRESENT ILLNESS:  Patient was in his usual state of health until  last night at 7 p.m.  He noted severe left mid quadrant/flank pain.  This  continued and intensified all night long.  He was unable to sleep.  The pain  this morning has remained and he is nauseated, unable to eat and drinking  very little.  He has had no vomiting, no melena, no hematochezia, and no  change in bowel habits.  He has not had an episode like this before.  There  is records presumably from a barium enema in the 1970s that he has  diverticulosis.  He had a colonoscopy in 1998 by Dr. Virginia Rochester that showed a  polyp.  CT scan of the abdomen done in the ER shows distal descending colon  diverticulitis.  He is very resistant to the notion of outpatient  management, oral antibiotics, etc.   PAST MEDICAL HISTORY:  1.  Partial deafness.  2.  Diverticulosis.  3.  Right renal cysts.  4.  Degenerative disk disease.  5.  Nephrolithiasis.  6.  History of hematemesis in 1998.  7.  Neck pain.  8.  Right ulnar neuropathy.   PAST SURGICAL HISTORY:  1.  Tonsillectomy.  2.  Appendectomy.  3.  Right kidney operation in the 1960s.  4.  Right foot surgery in 1995.  5.  Hip replacement in 1999.  6.  Spinal fusion in 1969.   CURRENT MEDICATIONS:  1.  Toprol XL 50 daily.  2.  Nexium p.r.n. for dyspepsia.  3.  Quinine sulfate p.r.n. leg cramps.   PHYSICAL EXAMINATION:  GENERAL:  The patient looks to be in absolutely no  distress.  VITAL SIGNS:  He is  afebrile.  HEENT:  Moist mucous membranes.  No other findings.  RESPIRATORY:  Clear.  CARDIAC:  Heart sounds are normal.  There is no murmurs, no gallops.  No  increase in jugular venous pressure.  ABDOMEN:  Obese.  Bowel sounds are positive.  He has mildly tender left  lower quadrant, left mid quadrant, but no guarding, no rebound.  RECTAL:  Deferred.  EXTREMITIES:  No edema.   LABORATORIES:  White count 8.6 with a mild left shift at 86%, hemoglobin  13.8, platelets 178.  Basic metabolic panel is essentially normal.  Urinalysis is normal.   IMPRESSION:  1.  Acute diverticulitis.  The patient feels too nauseated to attempt      outpatient management with oral antibiotics and fluids.  Give him      intravenous antibiotics overnight, attempt to change to p.o.  antibiotics      tomorrow and consider discharge then.  2.  Other medical problems seem stable.  He is certainly stable for      admission to a general medical bed.      MGR/MEDQ  D:  07/17/2004  T:  07/17/2004  Job:  [id number]
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
## 2010-10-20 ENCOUNTER — Telehealth: Payer: Self-pay | Admitting: Internal Medicine

## 2010-10-20 NOTE — Telephone Encounter (Signed)
Pt met you in the hospital when his wife, [name] [name], had a stent removed by you. You informed [name] to call for an appt. Yesterday pt stated he had to strain for a BM and when he stood up he had a "lump the size of an orange on the left side of his intestinal area". He reports the site/lump was soft and after a while it went away. Pt has a hx of chronic constipation and gets nausea after he eats. Hx includes CDIFF, HTN, Hyperlipidemia, Arthritis that he takes TYLOX for.  Pt was hospitalized 08/12/10 for Nausea and Dizziness where he had abdominal series that showed non specific non obstructive bowel gas pattern. You're earliest NP appt is 12/07/10. Any suggestions?  Thanks.

## 2010-10-21 NOTE — Telephone Encounter (Signed)
Patient is scheduled for Dr Leone Payor on 10/27/10.  He has seen Dr Bosie Clos in the past not sure how long but within a year.  He will have records sent.  Dr Leone Payor will you see him?

## 2010-10-21 NOTE — Telephone Encounter (Signed)
Yes I will

## 2010-10-27 ENCOUNTER — Ambulatory Visit: Payer: Medicare Other | Admitting: Internal Medicine

## 2010-10-30 ENCOUNTER — Telehealth: Payer: Self-pay | Admitting: Internal Medicine

## 2010-10-30 NOTE — Telephone Encounter (Signed)
Left message for patient to call back  

## 2010-11-02 NOTE — Telephone Encounter (Signed)
Left message for patient to call back  

## 2010-11-03 NOTE — Telephone Encounter (Signed)
I have scheduled him to see Dr Leone Payor 11/13/10 2:15

## 2010-11-13 ENCOUNTER — Encounter: Payer: Self-pay | Admitting: Internal Medicine

## 2010-11-13 ENCOUNTER — Ambulatory Visit (INDEPENDENT_AMBULATORY_CARE_PROVIDER_SITE_OTHER): Payer: Medicare Other | Admitting: Internal Medicine

## 2010-11-13 VITALS — BP 124/68 | HR 72 | Ht 75.0 in | Wt 245.0 lb

## 2010-11-13 DIAGNOSIS — K5909 Other constipation: Secondary | ICD-10-CM

## 2010-11-13 DIAGNOSIS — K5900 Constipation, unspecified: Secondary | ICD-10-CM

## 2010-11-13 DIAGNOSIS — R198 Other specified symptoms and signs involving the digestive system and abdomen: Secondary | ICD-10-CM

## 2010-11-13 DIAGNOSIS — R194 Change in bowel habit: Secondary | ICD-10-CM | POA: Insufficient documentation

## 2010-11-13 MED ORDER — PEG-KCL-NACL-NASULF-NA ASC-C 100 G PO SOLR: 1.0000 | Freq: Once | ORAL | Status: DC

## 2010-11-13 MED ORDER — MAGNESIUM HYDROXIDE 400 MG/5ML PO SUSP: 15.0000 mL | Freq: Every day | ORAL | Status: DC

## 2010-11-13 MED ORDER — MAGNESIUM HYDROXIDE 400 MG/5ML PO SUSP: 15.0000 mL | Freq: Every day | ORAL | Status: DC | PRN

## 2010-11-13 NOTE — Progress Notes (Signed)
  Subjective:    Patient ID: [name] [name], male    DOB: Oct 16, 1940, 70 y.o.   MRN: [id number]  HPI 70 year old white man with chronic constipation and recent, 6-12 months worsening of bowel habits. Constipation is worse. I met him while caring for his wife at the hospital. He has seen other gastroenterologists in the past and was looking for another opinion. Chronic constipation. Known severe diverticulosis (colonoscopy 2010 - Dr. Virginia Rochester). He has tried fiber and prunes as well as other laxatives. Lately he is only able to defecate if he uses milk of magnesia (2 cups) and will move a large and hard stool with straining, then unload with several more progressively loose stools. Then will not defecate for several days. He will have some urge but is concerned about the straining. Associated nausea, was using Metasolv (metaclopramide) with relief. He had a gastric emptying study that was normal. MiraLax qd used to work and then stopped. Amitiza is not helping at qd. Oxycodone is used periodically for neck and shoulder pain. He has had very narrow stools and straining he character at times as well. He has not had any bleeding. He has regular medical follow up with Dr. Chilton Si.  Past Medical History  Diagnosis Date  . HTN (hypertension)   . HLD (hyperlipidemia)   . Arthritis   . Partial deafness   . Clostridium difficile colitis   . GERD (gastroesophageal reflux disease)    Past Surgical History  Procedure Date  . Appendectomy   . Tonsillectomy   . Renal cyst excision   . Colonoscopy 06/06/2008    severe sigmoid diverticulosis and internal hemorrhoids  . Esophagogastroduodenoscopy 06/06/2008    normal    reports that he has never smoked. He has never used smokeless tobacco. He reports that he does not drink alcohol or use illicit drugs. family history is negative for Colon cancer. No Known Allergies      Review of Systems Chronic hearing loss,   otherwise as above or all other review of systems  negative. Objective:   Physical Exam Well-developed elderly white man in no acute distress Eyes anicteric Mouth posterior pharynx clear Neck supple no thyromegaly or mass No supraclavicular or cervical adenopathy Lungs clear Heart S1-S2 no brochures or gallops Abdomen is soft and nontender without organomegaly or mass Rectal exam shows a normal anoderm. There is no mass or stool in the rectal vault. Normal resting tone. Normal squeeze, very strong. There is no abnormal descent when he performs a Valsalva and abdominal wall contraction is appropriate, though I would say initially he tightened his sphincter rather relaxing but overall I think that was normal. Extremities free of edema Neurologic alert x3        Assessment & Plan:

## 2010-11-13 NOTE — Patient Instructions (Addendum)
Go ahead and take your usual 2 cups of milk of magnesia tonight to help restore your bowel movements. Then start a regular dose of milk of magnesia with 1 or 2 tablespoons daily. You can skip a day or 2 if you go too often. I will see you at your colonoscopy and we will determine a long-term plan then.  Stop the Amitiza. This was taken off your medication list, it did not seem to be helping anyway. You have been scheduled for a Colonoscopy with separate instructions given. Your prep kit has been sent to your pharmacy for you to pick up.

## 2010-11-16 ENCOUNTER — Encounter: Payer: Self-pay | Admitting: Internal Medicine

## 2010-12-24 ENCOUNTER — Other Ambulatory Visit: Payer: Medicare Other | Admitting: Internal Medicine

## 2011-01-06 ENCOUNTER — Ambulatory Visit (INDEPENDENT_AMBULATORY_CARE_PROVIDER_SITE_OTHER): Payer: Medicare Other | Admitting: Surgery

## 2011-01-06 ENCOUNTER — Encounter (INDEPENDENT_AMBULATORY_CARE_PROVIDER_SITE_OTHER): Payer: Self-pay | Admitting: Surgery

## 2011-01-06 VITALS — BP 150/76 | HR 60 | Temp 97.6°F | Ht 75.0 in | Wt 253.6 lb

## 2011-01-06 DIAGNOSIS — K4090 Unilateral inguinal hernia, without obstruction or gangrene, not specified as recurrent: Secondary | ICD-10-CM

## 2011-01-06 NOTE — Progress Notes (Signed)
Subjective:     Patient ID: [name] [name], male   DOB: 06-28-1940, 70 y.o.   MRN: [id number]  HPI70 year old white male (husband of [name] [name]) with a symptomatic LIH.  This has curtailed his physical activity and he has gained 20 lbs.  He is frustrated.  He has discomfort with sitting and coughing.     Review of Systems  Constitutional: Negative.   HENT: Negative.   Eyes: Negative.   Respiratory: Negative.   Cardiovascular: Negative.   Gastrointestinal: Positive for abdominal pain.  Genitourinary: Negative.   Musculoskeletal: Negative.   Skin: Negative.   Neurological: Negative.   Hematological: Negative.   Psychiatric/Behavioral: Negative.   Review of Systems  Constitutional: Negative.   HENT: Negative.   Eyes: Negative.   Respiratory: Negative.   Cardiovascular: Negative.   Gastrointestinal: Positive for abdominal pain.  Genitourinary: Negative.   Musculoskeletal: Negative.   Skin: Negative.   Neurological: Negative.   Endo/Heme/Allergies: Negative.   Psychiatric/Behavioral: Negative.    Past Medical History  Diagnosis Date  . Inguinal hernia   . Hypertension   . Allergy   . Hearing loss   Bilateral hip replacements done at [facility] in [place] No history of DVT    Objective:   Physical Exam  Constitutional: He is oriented to person, place, and time. He appears well-developed and well-nourished.  HENT:  Head: Normocephalic and atraumatic.  Eyes: Pupils are equal, round, and reactive to light.  Neck: Neck supple.  Cardiovascular: Normal rate, regular rhythm and normal heart sounds.   Pulmonary/Chest: Effort normal and breath sounds normal.  Genitourinary: Penis normal.     Musculoskeletal: Normal range of motion.  Neurological: He is alert and oriented to person, place, and time.  Skin: Skin is warm and dry.  Psychiatric: He has a normal mood and affect. His behavior is normal. Judgment and thought content normal.   Filed Vitals:   01/06/11 1102  BP:  150/76  Pulse: 60  Temp: 97.6 F (36.4 C)  6'3"  253.6      Assessment:     Left inguinal hernia    Plan:     Left inguinal hernia repair with mesh. Open. At [facility] (patient preference)

## 2011-01-12 ENCOUNTER — Telehealth (INDEPENDENT_AMBULATORY_CARE_PROVIDER_SITE_OTHER): Payer: Self-pay | Admitting: General Surgery

## 2011-01-12 NOTE — Telephone Encounter (Signed)
The patient left a vm stating he had questions regarding an inguinal hernia surgery. The surgery schedulers tried to schedule and he will not until he speaks with Dr Daphine Deutscher. Called the patient and LM for him to call me back.

## 2011-01-22 ENCOUNTER — Ambulatory Visit (INDEPENDENT_AMBULATORY_CARE_PROVIDER_SITE_OTHER): Payer: Medicare Other | Admitting: Surgery

## 2011-01-22 DIAGNOSIS — K4090 Unilateral inguinal hernia, without obstruction or gangrene, not specified as recurrent: Secondary | ICD-10-CM

## 2011-01-22 NOTE — Progress Notes (Signed)
Mr. Hagan came in today and spoke with Jamey Ripa before seeing me. He has been having significant anxiety issues regarding his left inguinal hernia and scheduling his surgery. He is having trouble getting over his wife's complications after laparoscopic cholecystectomy. He has never had a chance to talk with Dr. Donell Beers about how things went and how frustrated he became after she had to go back to the operating room for surgery and remain on the ventilator in the intensive care unit for several days. His inability to communicate his frustration to Dr. Donell Beers has him in a quandary and he would like opportunity in a neutral environment just to talk with her so that he can bring closure.  I spoke with him about this and we are going to set up and meeting with [facility], Dr. Donell Beers, me, and Mr. Linse to attempt to resolve these issues and bring closure. I have asked Maryellen to set up such a meeting.

## 2011-01-25 ENCOUNTER — Encounter: Payer: Self-pay | Admitting: Internal Medicine

## 2011-03-01 ENCOUNTER — Encounter (INDEPENDENT_AMBULATORY_CARE_PROVIDER_SITE_OTHER): Payer: Self-pay | Admitting: General Surgery

## 2011-03-05 LAB — DIFFERENTIAL
Basophils Absolute: 0 10*3/uL (ref 0.0–0.1)
Lymphocytes Relative: 16 % (ref 12–46)
Neutro Abs: 3.5 10*3/uL (ref 1.7–7.7)
Neutrophils Relative %: 74 % (ref 43–77)

## 2011-03-05 LAB — CBC
HCT: 40 % (ref 39.0–52.0)
MCV: 93.2 fL (ref 78.0–100.0)
Platelets: 144 10*3/uL — ABNORMAL LOW (ref 150–400)
RBC: 4.3 MIL/uL (ref 4.22–5.81)
WBC: 4.7 10*3/uL (ref 4.0–10.5)

## 2011-03-05 LAB — COMPREHENSIVE METABOLIC PANEL
BUN: 20 mg/dL (ref 6–23)
CO2: 25 mEq/L (ref 19–32)
Chloride: 105 mEq/L (ref 96–112)
Creatinine, Ser: 0.94 mg/dL (ref 0.4–1.5)
GFR calc non Af Amer: >60 mL/min (ref >60)
Total Bilirubin: 1.3 mg/dL — ABNORMAL HIGH (ref 0.3–1.2)

## 2011-03-05 LAB — URINALYSIS, ROUTINE W REFLEX MICROSCOPIC
Glucose, UA: NEGATIVE mg/dL
Hgb urine dipstick: NEGATIVE
Protein, ur: NEGATIVE mg/dL

## 2011-03-11 ENCOUNTER — Ambulatory Visit (INDEPENDENT_AMBULATORY_CARE_PROVIDER_SITE_OTHER): Payer: Medicare Other | Admitting: Surgery

## 2011-03-11 DIAGNOSIS — K4090 Unilateral inguinal hernia, without obstruction or gangrene, not specified as recurrent: Secondary | ICD-10-CM

## 2011-03-11 NOTE — Progress Notes (Signed)
[name] comes in today to discuss his hernia.  It has caused him some pain with strenuous workout.  I spent several minutes discussing the pros and cons of hernia surgery. He has a lot of anxiety about the surgery. I told him about the potential complications including numbness and nerve pain.  He will meet with Dr. Donell Beers and myself tomorrow and I think he will want to schedule at that time.

## 2011-03-18 ENCOUNTER — Ambulatory Visit
Admission: RE | Admit: 2011-03-18 | Discharge: 2011-03-18 | Disposition: A | Discharge status: Home / Self Care | Payer: Medicare Other | Source: Ambulatory Visit | Attending: Surgery | Admitting: Surgery

## 2011-03-18 ENCOUNTER — Encounter (HOSPITAL_BASED_OUTPATIENT_CLINIC_OR_DEPARTMENT_OTHER)
Admission: RE | Admit: 2011-03-18 | Discharge: 2011-03-18 | Disposition: A | Discharge status: Home / Self Care | Payer: Medicare Other | Source: Ambulatory Visit | Attending: Surgery | Admitting: Surgery

## 2011-03-18 ENCOUNTER — Other Ambulatory Visit (INDEPENDENT_AMBULATORY_CARE_PROVIDER_SITE_OTHER): Payer: Self-pay | Admitting: Surgery

## 2011-03-18 DIAGNOSIS — Z01811 Encounter for preprocedural respiratory examination: Secondary | ICD-10-CM

## 2011-03-18 LAB — BASIC METABOLIC PANEL
CO2: 23 mEq/L (ref 19–32)
Calcium: 9.9 mg/dL (ref 8.4–10.5)
Chloride: 104 mEq/L (ref 96–112)
Sodium: 139 mEq/L (ref 135–145)

## 2011-03-23 ENCOUNTER — Emergency Department (HOSPITAL_COMMUNITY)
Admission: EM | Admit: 2011-03-23 | Discharge: 2011-03-23 | Disposition: A | Discharge status: Home / Self Care | Payer: Medicare Other | Source: Home / Self Care | Attending: Emergency Medicine | Admitting: Emergency Medicine

## 2011-03-23 ENCOUNTER — Ambulatory Visit (HOSPITAL_BASED_OUTPATIENT_CLINIC_OR_DEPARTMENT_OTHER)
Admission: RE | Admit: 2011-03-23 | Discharge: 2011-03-23 | Disposition: A | Discharge status: Home / Self Care | Payer: Medicare Other | Source: Ambulatory Visit | Attending: Surgery | Admitting: Surgery

## 2011-03-23 DIAGNOSIS — I10 Essential (primary) hypertension: Secondary | ICD-10-CM | POA: Insufficient documentation

## 2011-03-23 DIAGNOSIS — I4892 Unspecified atrial flutter: Secondary | ICD-10-CM | POA: Insufficient documentation

## 2011-03-23 DIAGNOSIS — Z01810 Encounter for preprocedural cardiovascular examination: Secondary | ICD-10-CM | POA: Insufficient documentation

## 2011-03-23 DIAGNOSIS — K4090 Unilateral inguinal hernia, without obstruction or gangrene, not specified as recurrent: Secondary | ICD-10-CM | POA: Insufficient documentation

## 2011-03-23 DIAGNOSIS — K219 Gastro-esophageal reflux disease without esophagitis: Secondary | ICD-10-CM | POA: Insufficient documentation

## 2011-03-23 DIAGNOSIS — Z01812 Encounter for preprocedural laboratory examination: Secondary | ICD-10-CM | POA: Insufficient documentation

## 2011-03-23 HISTORY — PX: INGUINAL HERNIA REPAIR: SUR1180

## 2011-03-23 LAB — CBC
Hemoglobin: 14.5 g/dL (ref 13.0–17.0)
MCH: 30.7 pg (ref 26.0–34.0)
RBC: 4.73 MIL/uL (ref 4.22–5.81)
WBC: 7.9 10*3/uL (ref 4.0–10.5)

## 2011-03-23 LAB — COMPREHENSIVE METABOLIC PANEL
ALT: 31 U/L (ref 0–53)
AST: 31 U/L (ref 0–37)
Albumin: 4.2 g/dL (ref 3.5–5.2)
Alkaline Phosphatase: 76 U/L (ref 39–117)
Calcium: 9.6 mg/dL (ref 8.4–10.5)
Glucose, Bld: 100 mg/dL — ABNORMAL HIGH (ref 70–99)
Potassium: 4.7 mEq/L (ref 3.5–5.1)
Sodium: 137 mEq/L (ref 135–145)
Total Protein: 6.7 g/dL (ref 6.0–8.3)

## 2011-03-23 LAB — POCT I-STAT, CHEM 8
BUN: 24 mg/dL — ABNORMAL HIGH (ref 6–23)
Chloride: 103 mEq/L (ref 96–112)
HCT: 44 % (ref 39.0–52.0)
Sodium: 137 mEq/L (ref 135–145)
TCO2: 24 mmol/L (ref 0–100)

## 2011-03-23 LAB — PROTIME-INR
INR: 1.09 (ref 0.00–1.49)
Prothrombin Time: 14.3 seconds (ref 11.6–15.2)

## 2011-03-23 LAB — DIFFERENTIAL
Basophils Relative: 0 % (ref 0–1)
Monocytes Relative: 3 % (ref 3–12)
Neutro Abs: 7.2 10*3/uL (ref 1.7–7.7)
Neutrophils Relative %: 91 % — ABNORMAL HIGH (ref 43–77)

## 2011-03-23 LAB — APTT: aPTT: 28 seconds (ref 24–37)

## 2011-03-23 LAB — POCT HEMOGLOBIN-HEMACUE: Hemoglobin: 14.7 g/dL (ref 13.0–17.0)

## 2011-03-23 LAB — TSH: TSH: 1.219 u[IU]/mL (ref 0.350–4.500)

## 2011-03-24 ENCOUNTER — Other Ambulatory Visit (HOSPITAL_COMMUNITY): Payer: Self-pay | Admitting: Cardiovascular Disease

## 2011-03-24 DIAGNOSIS — I4892 Unspecified atrial flutter: Secondary | ICD-10-CM

## 2011-03-24 NOTE — Op Note (Signed)
  NAMEJARMAINE, EHRLER                 ACCOUNT NO.:  [id number]  MEDICAL RECORD NO.:  [id number]  LOCATION:                                 FACILITY:  PHYSICIAN:  Thornton Park. Daphine Deutscher, MD  DATE OF BIRTH:  [birth year]  DATE OF PROCEDURE:  03/23/2011 DATE OF DISCHARGE:                              OPERATIVE REPORT   PREOPERATIVE DIAGNOSIS:  Left inguinal hernia.  POSTOPERATIVE DIAGNOSIS:  Large indirect left inguinal hernia.  PROCEDURE:  Open repair of left inguinal hernia with Marlex type mesh.  SURGEON:  Thornton Park. Daphine Deutscher, MD.  ASSISTANT:  None.  ANESTHESIA:  General.  DESCRIPTION OF PROCEDURE:  Mr. Mcclimans is a 70 year old white male, was taken to room #5 at [facility] on March 23, 2011 and given general.  The abdomen and his left groin were clipped and then prepped with a PC max and draped sterilely.  I made a small oblique incision in left region.  I went through a very generous about 2 inch fat layer to prominent bulge in his inguinal region, which proved to be his hernia presenting itself out of the external ring.  I went and got a nice smooth dissection plane around that and incised the external oblique down to the external ring and then mobilized the cord.  I put a Penrose drain around it.  The cord itself was about 3-4 cm in diameter.  I went proximally and saw a nice hernia sac and I elected to separate this from the inguinal structures and did extend that there may well be some sliding components up in the neck.  I elected just a kind of get it totally free and then reduced the entire thing into the abdomen.  Had been done left about 3 finger defect.  Medially, I went in and placed a simple horizontal mattress, but elected to cut a piece of mesh about posted sized and placed it medially on either side of the ring to obliterate that defect without an immediate to leave plenty room for the inguinal and spermatic structures.  I had good visualization of anatomy and  then repaired the floor with a piece of Marlex mesh cut to fit.  I sutured it along the inguinal ligament with running 2-0 Prolene, took it around the cord and sutured it to itself.  The external oblique was then closed with running 2-0 Vicryl.  A 4-0 Vicryl was used in the subcutaneous tissue and then the wound was closed with subcuticular 4-0 Vicryl with 5-0 Monocryl and Dermabond on the skin. The patient was taken to recovery room in satisfactory condition.     Thornton Park Daphine Deutscher, MD     MBM/MEDQ  D:  03/23/2011  T:  03/23/2011  Job:  [id number]  cc:   Lenon Curt. Chilton Si, M.D.  Electronically Signed by Luretha Murphy MD on 03/24/2011 05:02:12 PM

## 2011-03-25 ENCOUNTER — Other Ambulatory Visit (HOSPITAL_COMMUNITY): Payer: Medicare Other | Admitting: Radiology

## 2011-03-25 ENCOUNTER — Telehealth (INDEPENDENT_AMBULATORY_CARE_PROVIDER_SITE_OTHER): Payer: Self-pay

## 2011-03-25 NOTE — Telephone Encounter (Signed)
Pt called requesting to decrease for stop his pain med. States percocet causing him to be too sleepy. Pt advised if his pain is controlled with tylenol he can stop his pain med or try 1/2 dose of pain med. Pt states he is doing well with minimal discomfort, just very drowsy with narcotic. Pt will try tylenol and call if he has any concerns.

## 2011-03-31 ENCOUNTER — Encounter: Payer: Self-pay | Admitting: *Deleted

## 2011-04-01 ENCOUNTER — Ambulatory Visit (HOSPITAL_COMMUNITY): Payer: Medicare Other | Attending: Cardiology | Admitting: Radiology

## 2011-04-01 DIAGNOSIS — I059 Rheumatic mitral valve disease, unspecified: Secondary | ICD-10-CM | POA: Insufficient documentation

## 2011-04-01 DIAGNOSIS — I4892 Unspecified atrial flutter: Secondary | ICD-10-CM | POA: Insufficient documentation

## 2011-04-01 DIAGNOSIS — I079 Rheumatic tricuspid valve disease, unspecified: Secondary | ICD-10-CM | POA: Insufficient documentation

## 2011-04-01 DIAGNOSIS — E669 Obesity, unspecified: Secondary | ICD-10-CM | POA: Insufficient documentation

## 2011-04-02 NOTE — Consult Note (Signed)
NAMEGIANKARLO, [name] NO.:  [id number]  MEDICAL RECORD NO.:  [id number]  LOCATION:  [facility]                         FACILITY:  [facility]  PHYSICIAN:  Vesta Mixer, M.D. DATE OF BIRTH:  1940/10/10  DATE OF CONSULTATION:  03/23/2011 DATE OF DISCHARGE:                                CONSULTATION   CHIEF COMPLAINT:  Arrhythmia during inguinal hernia repair.  HISTORY OF PRESENT ILLNESS:  [name] is a pleasant 70 year old Caucasian male with history of hypertension, hyperlipidemia who presented to [facility] Emergency Department after developing an atrial flutter arrhythmia post-op, after an elective left inguinal hernia repair.  Surgery was performed by Dr. Daphine Deutscher at [facility] who informed his wife of the arrhythmia and suggested presenting to the [facility] Emergency Department.  While in the emergency department, EKG revealed atrial flutter and [facility] was consulted.  He denies chest pain, nausea, vomiting, shortness of breath, dizziness, weakness, abdominal pain, fevers, chills, palpitations, or diaphoresis.  Of note, per report by the patient, after undergoing surgery in 2004, he experienced an unknown arrhythmia which reconverted to normal sinus rhythm spontaneously.  PAST MEDICAL HISTORY: 1. Hypertension. 2. Hyperlipidemia. 3. Arthritis. 4. Partial deafness.  PAST SURGICAL HISTORY: 1. Appendectomy - 1956. 2. Tonsillectomy - 1943. 3. Left inguinal hernia repair - March 23, 2011. 4. Right knee surgeries - 1961, 1963. 5. Bilateral hip replacement - 1999. 6. Right arm revascularization - 2004.  SOCIAL HISTORY:  Lives with wife.  Occupation, retired.  He denies tobacco use, alcohol use, and recreational drug use.  He states that he undergoes aerobic exercise for 1 hour each day for 5 days, and anaerobic exercise for 30 minutes each day for 5 days.  FAMILY HISTORY:  He reports that his father had hypertension. Otherwise, no other  significant cardiac family history.  REVIEW OF SYSTEMS:  CONSTITUTIONAL:  Denies fevers, chills, sweats. CARDIOPULMONARY:  Denies chest pain, shortness of breath, dyspnea on exertion, orthopnea, paroxysmal nocturnal dyspnea, edema, and presyncope. GENITOURINARY:  Reports nocturia x1 during the night. NEURO/PSYCH:  Denies weakness. MUSCULOSKELETAL:  Reports chronic neck pain. GI:  Denies nausea, vomiting, diarrhea. All other systems reviewed and negative.  MEDICATIONS: 1. Tekturna 150/12.5 mg p.o. daily. 2. Exforge 10/320 mg p.o. daily. 3. Oxycodone 5/500 mg p.o. p.r.n. 4. Nexium 40 mg capsule p.r.n. for indigestion. 5. Reglan 5 mg p.o. p.r.n. for nausea/constipation.  ALLERGIES:  No known drug allergies.  PHYSICAL EXAMINATION:  VITAL SIGNS:  Temperature 97.6 degrees Fahrenheit, pulse 46 beats per minute, respirations 15, blood pressure 136/94, SAO2 100% on room air. GENERAL:  White male in no acute distress.  A&O x3.  Normal affect. HEENT:  Normocephalic, atraumatic.  Extraocular muscles intact.  Nares without discharge. NECK:  Supple without lymphadenopathy.  No bruits.  No JVD. CARDIOVASCULAR:  Regularly irregular rhythm, S1, S2, without murmurs, rubs, heaves, or gallops.  Normal PMI.  Pulse is 2+ and equal without bruits. LUNGS:  Clear to auscultation without wheezes, rubs, rales, or rhonchi. SKIN:  No rash/lesions. ABDOMINAL:  Soft, incisional tenderness left lower quadrant. Normoactive bowel sounds. EXTREMITIES:  No clubbing, cyanosis, or edema.  EKG rate 39 rhythm, atrial flutter with  variable 3-1, 4-1 conduction. No ST, ischemic changes.  No visible T-waves.  LABORATORY DATA:  Upon the time of dictation, hemoglobin was 14.7.  Chem panel - Sodium 137, potassium 4.6, chloride 103, BUN 24, creatinine 1.3, glucose 101, and CO2 is 24.  CBC, white blood cell count 7.9, hemoglobin 14.5, hematocrit 41.7, and platelets 161.  Coagulation - PT 14.3, INR 1.09, PTT 28.  TSH  is still pending.  ASSESSMENT AND PLAN:  This is a 70 year old Caucasian male, status post left hernia repair found to be an atrial flutter immediately postop. 1. Atrial flutter.  ECG reveals 3:1, 4:1 conduction.  He is     asymptomatic and does not have a coronary artery disease history.     a.     CBC, coags to rule out any acute process.         I. Comprehensive metabolic panel, TSH ordered to rule out             electrolytes/thyroid etiologies.         II.In his recent postop status and history of unknown             arrhythmia was found to be slowly convert to normal sinus             rhythm.  Soon after, most recent surgery in 2004.  While             discharge today on full dose aspirin 325 mg to cover             anticoagulation.  We will follow up in approximately 1 week             with Dr. Elease Hashimoto.  Outpatient echo will be scheduled at this             time.  We will discuss potential for further             anticoagulation and discontinued cardioversion later, still             in atrial flutter. 2. Pain.  The patient is status post left hernia repair.  We will     manage pain with Percocet.  Continue postop orders and fentanyl     replacing morphine 1 mg IV for breakthrough pain.  The patient was seen and examined by Dr. Elease Hashimoto and myself.    ______________________________ Odella Aquas, PA-C   ______________________________ Vesta Mixer, M.D.    RA/MEDQ  D:  03/23/2011  T:  03/24/2011  Job:  [id number]  cc:   Lenon Curt. Chilton Si, M.D.  Electronically Signed by Odella Aquas PA on 04/01/2011 02:52:17 PM Electronically Signed by Kristeen Miss M.D. on 04/02/2011 03:19:26 PM

## 2011-04-05 ENCOUNTER — Ambulatory Visit: Payer: Medicare Other | Admitting: Cardiovascular Disease

## 2011-04-08 ENCOUNTER — Ambulatory Visit (INDEPENDENT_AMBULATORY_CARE_PROVIDER_SITE_OTHER): Payer: Medicare Other | Admitting: Surgery

## 2011-04-08 ENCOUNTER — Encounter (INDEPENDENT_AMBULATORY_CARE_PROVIDER_SITE_OTHER): Payer: Self-pay | Admitting: Surgery

## 2011-04-08 DIAGNOSIS — K4090 Unilateral inguinal hernia, without obstruction or gangrene, not specified as recurrent: Secondary | ICD-10-CM

## 2011-04-08 NOTE — Progress Notes (Signed)
[name] [name] is 2-1/2 weeks from his left inguinal hernia repair with mesh  His incision is healing fine. He. has a tiny grape size low hematoma I can feel up at the upper reaches of his cord. His testicle is fine and. Overall he is doing well. I told him he could away and began cardio training on a treadmill. No heavy lifting until I see him in 6 weeks.  We talked his cardiac event and this was a atrial flutter.  He saw Dr.Phil Nahser in the [facility] ER who cleared him.  I will defer to Dr. Chilton Si about any further cardiac workup.  He was to lose about 30 pounds. I discussed going on Atkins diet with him. We'll see her I does with protein diet. Plan return in 6 weeks

## 2011-04-08 NOTE — Patient Instructions (Signed)
Still no heavy lifting for 6 weeks. May resume walking on the treadmill.

## 2011-05-19 ENCOUNTER — Ambulatory Visit (INDEPENDENT_AMBULATORY_CARE_PROVIDER_SITE_OTHER): Payer: Medicare Other | Admitting: Surgery

## 2011-05-19 ENCOUNTER — Encounter (INDEPENDENT_AMBULATORY_CARE_PROVIDER_SITE_OTHER): Payer: Self-pay | Admitting: Surgery

## 2011-05-19 VITALS — BP 136/80 | HR 60 | Temp 98.4°F | Resp 16 | Ht 75.0 in | Wt 249.0 lb

## 2011-05-19 DIAGNOSIS — Z9889 Other specified postprocedural states: Secondary | ICD-10-CM

## 2011-05-19 DIAGNOSIS — Z8719 Personal history of other diseases of the digestive system: Secondary | ICD-10-CM

## 2011-05-19 NOTE — Progress Notes (Signed)
Postop doing well.  Incision and repair intact.  He is a little anxious on were all he can do. I went over exercise basically encourage him to do whatever he wants to do within reason. I encouraged him to go on a low carb diet to try and help him lose some weight and he voices appreciation at the process that we had with Dr. Donell Beers and that conflict resolution. I told him I be happy to see me can whenever needed in the future. Steward Drone is doing well he thank me for her care.  Status post left inguinal hernia repair return p.r.n.

## 2011-05-19 NOTE — Patient Instructions (Signed)
Exercise ad lib

## 2011-08-08 ENCOUNTER — Emergency Department (HOSPITAL_BASED_OUTPATIENT_CLINIC_OR_DEPARTMENT_OTHER)
Admission: EM | Admit: 2011-08-08 | Discharge: 2011-08-08 | Disposition: A | Discharge status: Home / Self Care | Payer: Medicare Other | Attending: Emergency Medicine | Admitting: Emergency Medicine

## 2011-08-08 ENCOUNTER — Encounter (HOSPITAL_BASED_OUTPATIENT_CLINIC_OR_DEPARTMENT_OTHER): Payer: Self-pay

## 2011-08-08 DIAGNOSIS — Z87891 Personal history of nicotine dependence: Secondary | ICD-10-CM | POA: Insufficient documentation

## 2011-08-08 DIAGNOSIS — K219 Gastro-esophageal reflux disease without esophagitis: Secondary | ICD-10-CM | POA: Insufficient documentation

## 2011-08-08 DIAGNOSIS — R21 Rash and other nonspecific skin eruption: Secondary | ICD-10-CM

## 2011-08-08 DIAGNOSIS — I10 Essential (primary) hypertension: Secondary | ICD-10-CM | POA: Insufficient documentation

## 2011-08-08 DIAGNOSIS — M129 Arthropathy, unspecified: Secondary | ICD-10-CM | POA: Insufficient documentation

## 2011-08-08 DIAGNOSIS — E785 Hyperlipidemia, unspecified: Secondary | ICD-10-CM | POA: Insufficient documentation

## 2011-08-08 MED ORDER — DIPHENHYDRAMINE HCL 25 MG PO CAPS
ORAL_CAPSULE | ORAL | Status: AC
  Administered 2011-08-08: 25 mg via ORAL
  Filled 2011-08-08: qty 2

## 2011-08-08 MED ORDER — PREDNISONE 50 MG PO TABS
60.0000 mg | ORAL_TABLET | Freq: Once | ORAL | Status: AC
  Administered 2011-08-08: 60 mg via ORAL
  Filled 2011-08-08: qty 1

## 2011-08-08 MED ORDER — DIPHENHYDRAMINE HCL 25 MG PO CAPS
25.0000 mg | ORAL_CAPSULE | Freq: Once | ORAL | Status: AC
  Administered 2011-08-08: 25 mg via ORAL
  Filled 2011-08-08: qty 1

## 2011-08-08 MED ORDER — DIPHENHYDRAMINE HCL 50 MG PO CAPS
50.0000 mg | ORAL_CAPSULE | Freq: Once | ORAL | Status: DC
  Filled 2011-08-08: qty 1

## 2011-08-08 MED ORDER — PREDNISONE 20 MG PO TABS: 40.0000 mg | ORAL_TABLET | Freq: Every day | ORAL | Status: AC

## 2011-08-08 NOTE — ED Notes (Signed)
Pt e-prescribed prednisone- alert and ambulatory at time of d/c

## 2011-08-08 NOTE — ED Notes (Signed)
Pt presents with rash all over torso, back, extremities, not on face.  Pt states it doesn't itch, denies changes in medications/foods, no change in detergents. Pt in nad at this time.

## 2011-08-08 NOTE — Discharge Instructions (Signed)

## 2011-08-08 NOTE — ED Provider Notes (Signed)
History  Scribed for Gwyneth Sprout, MD, the patient was seen in room MH06/MH06. This chart was scribed by Candelaria Stagers. The patient's care started at 8:13 PM    CSN: [id number]  Arrival date & time 08/08/11  1740   First MD Initiated Contact with Patient 08/08/11 1943      Chief Complaint  Patient presents with  . Rash     The history is provided by the patient.   [name] [name]. is a 71 y.o. male who presents to the Emergency Department complaining of a rash all over his body, except his face, that started in the last 24 hours.  He states that he has not eaten anything different,changed soaps, or changed medications.  He states that the only thing that might be different is a men's lotion that he rubbed on arms, but not chest, arms, or legs.  He denies itching, swelling of tongue, pain, or fever.   Past Medical History  Diagnosis Date  . HTN (hypertension)   . HLD (hyperlipidemia)   . Arthritis   . Partial deafness   . Clostridium difficile colitis   . GERD (gastroesophageal reflux disease)   . Inguinal hernia   . Hypertension   . Allergy   . Hearing loss     Past Surgical History  Procedure Date  . Appendectomy   . Tonsillectomy   . Renal cyst excision   . Colonoscopy 06/06/2008    severe sigmoid diverticulosis and internal hemorrhoids  . Esophagogastroduodenoscopy 06/06/2008    normal  . Back surgery   . Total hip arthroplasty     bilateral  . Wrist surgery   . Elbow surgery   . Hernia repair 03/23/11    [facility]    Family History  Problem Relation Age of Onset  . Colon cancer Neg Hx   . Hypertension Father     History  Substance Use Topics  . Smoking status: Former Games developer  . Smokeless tobacco: Not on file  . Alcohol Use: No     denies      Review of Systems  Constitutional: Negative for fever.  Skin: Positive for rash.       No itching  All other systems reviewed and are negative.    Allergies  Review of patient's allergies indicates no  known allergies.  Home Medications   Current Outpatient Rx  Name Route Sig Dispense Refill  . ALISKIREN-HYDROCHLOROTHIAZIDE 150-12.5 MG PO TABS Oral Take 1 tablet by mouth daily.      Marland Kitchen DILTIAZEM HCL ER 240 MG PO CP24 Oral Take 240 mg by mouth daily.    Marland Kitchen ESOMEPRAZOLE MAGNESIUM 40 MG PO CPDR Oral Take 40 mg by mouth daily as needed. For acid reflux    . IBUPROFEN PO Oral Take 1 tablet by mouth daily.     Marland Kitchen MAGNESIUM HYDROXIDE 400 MG/5ML PO SUSP Oral Take 30 mLs by mouth 2 (two) times a week. For constipation    . VITAMIN C 500 MG PO TABS Oral Take 2,000 mg by mouth daily.       BP 186/76  Pulse 65  Temp(Src) 98.2 F (36.8 C) (Oral)  Resp 20  Ht 6\' 3"  (1.905 m)  Wt 255 lb (115.667 kg)  BMI 31.87 kg/m2  SpO2 98%  Physical Exam  Nursing note and vitals reviewed. Constitutional: He is oriented to person, place, and time. He appears well-developed and well-nourished. No distress.  HENT:  Head: Normocephalic and atraumatic.  No tongue swelling   Eyes: EOM are normal. Right eye exhibits no discharge. Left eye exhibits no discharge.  Neck: Normal range of motion. Neck supple.  Cardiovascular: Normal rate and regular rhythm.   Pulmonary/Chest: Effort normal. No respiratory distress.  Musculoskeletal: Normal range of motion. He exhibits no edema and no tenderness.  Neurological: He is alert and oriented to person, place, and time.  Skin: Rash (macular rash that's blanching on the trunk and upper extremities; non blanching macular rash on the thighs, no pupura, peticia on lower extremities) noted. He is not diaphoretic.  Psychiatric: He has a normal mood and affect. His behavior is normal.    ED Course  Procedures   DIAGNOSTIC STUDIES: Oxygen Saturation is 98% on room air, normal by my interpretation.    COORDINATION OF CARE:     Labs Reviewed - No data to display No results found.   1. Rash       MDM  Patient with an allergic type rash diffusely over his body  without any involvement in the mouth, tongue or uvula swelling. Patient is in no acute distress is unclear what caused the symptoms. Will start him on prednisone and he has followup with his doctor on Thursday. Patient is to return if he develops any acute swelling or breathing difficulty.  I personally performed the services described in this documentation, which was scribed in my presence.  The recorded information has been reviewed and considered.         Gwyneth Sprout, MD 08/09/11 [phone]

## 2011-08-08 NOTE — ED Notes (Signed)
Pt has small reddened rash generalized over body.  Pt reports no new medications, but has changed soaps and lotions recently.  Pt took no benedryl PTA.

## 2012-03-21 ENCOUNTER — Telehealth: Payer: Self-pay | Admitting: Cardiovascular Disease

## 2012-03-21 NOTE — Telephone Encounter (Signed)
New Problem:    Dr. Thomasene Lot office called in wanting this patient seen asap.  Patient is having arrythmia and bradycardia and wanted to leave his personal cell phone number for Dr. Elease Hashimoto to give his a call back to discuss the patient.  Please have Dr. Elease Hashimoto call back.

## 2012-03-21 NOTE — Telephone Encounter (Signed)
Showed Dr Clifton James the ekg and prior EKG's with notes, pt can be seen this week with DR Nahser or NP/PA.

## 2012-03-21 NOTE — Telephone Encounter (Signed)
App made for morning with PA Alben Spittle

## 2012-03-21 NOTE — Telephone Encounter (Addendum)
Spoke with Dr Chilton Si, pt is asymptomatic but today's EKG is stating first degree AV block but feels he is in afib. Informed him that Dr Elease Hashimoto is out of office this afternoon but will be in office tomorrow. Dr Chilton Si will fax ekg and note.

## 2012-03-22 ENCOUNTER — Encounter: Payer: Self-pay | Admitting: Physician Assistant

## 2012-03-22 ENCOUNTER — Ambulatory Visit (INDEPENDENT_AMBULATORY_CARE_PROVIDER_SITE_OTHER): Payer: Medicare Other | Admitting: Physician Assistant

## 2012-03-22 VITALS — BP 178/80 | HR 40 | Ht 75.0 in | Wt 265.0 lb

## 2012-03-22 DIAGNOSIS — I10 Essential (primary) hypertension: Secondary | ICD-10-CM

## 2012-03-22 DIAGNOSIS — I4892 Unspecified atrial flutter: Secondary | ICD-10-CM

## 2012-03-22 DIAGNOSIS — N189 Chronic kidney disease, unspecified: Secondary | ICD-10-CM

## 2012-03-22 LAB — CBC WITH DIFFERENTIAL/PLATELET
Basophils Absolute: 0.1 10*3/uL (ref 0.0–0.1)
Eosinophils Absolute: 0.1 10*3/uL (ref 0.0–0.7)
Hemoglobin: 14 g/dL (ref 13.0–17.0)
Lymphocytes Relative: 22.6 % (ref 12.0–46.0)
Lymphs Abs: 1.1 10*3/uL (ref 0.7–4.0)
MCHC: 32.7 g/dL (ref 30.0–36.0)
Monocytes Relative: 9.9 % (ref 3.0–12.0)
Neutro Abs: 3.1 10*3/uL (ref 1.4–7.7)
Platelets: 166 10*3/uL (ref 150.0–400.0)
RDW: 14.7 % — ABNORMAL HIGH (ref 11.5–14.6)

## 2012-03-22 MED ORDER — APIXABAN 5 MG PO TABS: 5.0000 mg | ORAL_TABLET | Freq: Two times a day (BID) | ORAL | Status: DC

## 2012-03-22 NOTE — Patient Instructions (Addendum)
You have been referred to WITHIN 1-2 WEEKS PT NEEDS TO BE SEE ONE OF OUR EP DOCTORS; DX A-FLUTTER;   Your physician has recommended you make the following change in your medication: START ELIQUIS 5 MG 1 TABLET EVERY 12 HOURS  Your physician recommends that you return for lab work in: TODAY CBC W/DIFF, TSH

## 2012-03-22 NOTE — Progress Notes (Signed)
[address] [place], [place]  [address] Phone: [phone], Fax:  [phone]  Date:  03/22/2012   Name:  [name] [name].   DOB:  03/06/1941   MRN:  [id number]  PCP:  Kimber Relic, MD  Primary Cardiologist:  Dr. Delane Ginger  Primary Electrophysiologist:  None    History of Present Illness: [name] [name]. is a 71 y.o. male who is added on to my schedule this morning for evaluation of AFlutter.  He has a hx of HTN, HL, atrial flutter. He was evaluated by Dr. Elease Hashimoto in 10/12 after presenting to the emergency room with atrial flutter with slow ventricular response. He was placed on an ASA and set up for an echocardiogram and followup in the office. Echocardiogram demonstrated moderate LVH with normal LV function and biatrial enlargement. We have not seen him since that time. He was at his PCPs office yesterday and noted to be bradycardic. ECG demonstrated atrial flutter with a heart rate of 45. Repeat ECG this morning continues to demonstrate atrial flutter with a ventricular rate of 41. I do have an old ECG from 3/12 that demonstrates NSR, HR 70, normal axis, T-wave inversions in 1, 2, 3, aVF, V2-V6. This appears to be LVH with repolarization abnormality. I do not have any other ECGs to compare with.  He is not aware of this rhythm. He denies syncope, near syncope, fatigue, chest pain, shortness of breath, orthopnea, PND or edema. He did stop exercising about 4 months ago due to stress. Otherwise, his exercise tolerance has not changed.  Labs (3/12):     LDL 66, A1c 5.8 Labs (10/12):   K 4.6, creatinine 1.3, ALT 31, Hgb 15, TSH 1.219 Labs (10/13):   K 4.3, creatinine 1.37  Wt Readings from Last 3 Encounters:  03/22/12 265 lb (120.203 kg)  08/08/11 255 lb (115.667 kg)  05/19/11 249 lb (112.946 kg)     Past Medical History  Diagnosis Date  . HTN (hypertension)   . HLD (hyperlipidemia)   . Arthritis   . Partial deafness   . Clostridium difficile  colitis   . GERD (gastroesophageal reflux disease)   . Inguinal hernia     s/p repair 03/2011  . Allergy   . Hx of echocardiogram     a. Echo 11/12:  Mod LVH, EF 55-60%, MAC, mod LAE, mild RAE  . Hx of cardiac catheterization     a. LHC 4/01:  normal coronary arteries  . Depression   . Atrial flutter     a. dx after inguinal hernia repair in 10/12 => seen by Dr. Elease Hashimoto    Current Outpatient Prescriptions  Medication Sig Dispense Refill  . Aliskiren-Hydrochlorothiazide (TEKTURNA HCT) 150-12.5 MG TABS Take 1 tablet by mouth daily.        Marland Kitchen esomeprazole (NEXIUM) 40 MG capsule Take 40 mg by mouth daily as needed. For acid reflux      . HYDROcodone-ibuprofen (VICOPROFEN) 7.5-200 MG per tablet Take 1 tablet by mouth every 8 (eight) hours as needed.      . IBUPROFEN PO Take 1 tablet by mouth daily.       . magnesium hydroxide (MILK OF MAGNESIA) 400 MG/5ML suspension Take 30 mLs by mouth 2 (two) times a week. For constipation      . vitamin C (ASCORBIC ACID) 500 MG tablet Take 2,000 mg by mouth daily.       Marland Kitchen DISCONTD: Amlodipine Besylate-Valsartan (EXFORGE PO) Take by mouth  daily.          Allergies: No Known Allergies  Social History:   reports that he has quit smoking. He does not have any smokeless tobacco history on file. He reports that he does not drink alcohol or use illicit drugs.   Family History:   family history includes Hypertension in his father.  There is no history of Colon cancer.  ROS:  Please see the history of present illness.    He denies any bleeding problems. He denies falls. No fevers, chills, skin or hair changes.   All other systems reviewed and negative.   PHYSICAL EXAM: VS:  BP 178/80  Pulse 40  Ht 6\' 3"  (1.905 m)  Wt 265 lb (120.203 kg)  BMI 33.12 kg/m2 Well nourished, well developed, in no acute distress HEENT: normal Neck: no JVD Endocrine: No thyromegaly Cardiac:  normal S1, S2; RRR; no murmur Lungs:  clear to auscultation bilaterally, no wheezing,  rhonchi or rales Abd: soft, nontender, no hepatomegaly Ext: no edema Skin: warm and dry Neuro:  CNs 2-12 intact, no focal abnormalities noted  EKG:  Atrial flutter, HR 41      ASSESSMENT AND PLAN:  1. Atrial Flutter:    His atrial flutter looks to be fairly typical. He has a slow ventricular response. He is not symptomatic with this. His CHADS2 score is 1. He would likely be a candidate for atrial flutter ablation. I reviewed his case and ECG with Dr. Excell Seltzer (DOD). We have decided to place him on Eliquis 5 mg twice a day. I will refer him to electrophysiology. I had a long discussion with the patient today regarding his diagnosis. He had many questions and I spent greater than 20 minutes answering these.  2. Hypertension:  BP uncontrolled.  Diltiazem stopped yesterday.  Continue to monitor for now.  Consider adding Amlodipine if BP remains uncontrolled.  He denies hx of snoring or daytime hypersomnolence.  3. Chronic Kidney Disease:    His creatinine is acceptable to use Eliquis 5 mg bid.  Luna Glasgow, PA-C  8:27 AM 03/22/2012

## 2012-03-23 LAB — TSH: TSH: 1.57 u[IU]/mL (ref 0.35–5.50)

## 2012-03-29 ENCOUNTER — Encounter: Payer: Self-pay | Admitting: Physician Assistant

## 2012-03-30 ENCOUNTER — Ambulatory Visit (INDEPENDENT_AMBULATORY_CARE_PROVIDER_SITE_OTHER): Payer: Medicare Other | Admitting: Internal Medicine

## 2012-03-30 ENCOUNTER — Encounter: Payer: Self-pay | Admitting: *Deleted

## 2012-03-30 ENCOUNTER — Encounter: Payer: Self-pay | Admitting: Internal Medicine

## 2012-03-30 VITALS — BP 124/82 | HR 62 | Ht 75.0 in | Wt 249.0 lb

## 2012-03-30 DIAGNOSIS — I4892 Unspecified atrial flutter: Secondary | ICD-10-CM

## 2012-03-30 DIAGNOSIS — I10 Essential (primary) hypertension: Secondary | ICD-10-CM

## 2012-03-30 NOTE — Patient Instructions (Signed)

## 2012-03-30 NOTE — Assessment & Plan Note (Signed)
His blood pressure is well controlled. No change in medical therapy. 

## 2012-03-30 NOTE — Progress Notes (Signed)
HPI [name] is referred today for evaluation and treatment of atrial flutter. He has minimal palpitations. He was found to be out of rhythm on routine followup by Dr. Chilton [name]. He has never had syncope. He denies chest pain or sob.  No Known Allergies   Current Outpatient Prescriptions  Medication Sig Dispense Refill  . Aliskiren-Hydrochlorothiazide (TEKTURNA HCT) 150-12.5 MG TABS Take 1 tablet by mouth daily.        Marland Kitchen apixaban (ELIQUIS) 5 MG TABS tablet Take 1 tablet (5 mg total) by mouth 2 (two) times daily.  60 tablet  11  . esomeprazole (NEXIUM) 40 MG capsule Take 40 mg by mouth daily as needed. For acid reflux      . HYDROcodone-ibuprofen (VICOPROFEN) 7.5-200 MG per tablet Take 1 tablet by mouth every 8 (eight) hours as needed.      . vitamin C (ASCORBIC ACID) 500 MG tablet Take 2,000 mg by mouth daily.       Marland Kitchen DISCONTD: Amlodipine Besylate-Valsartan (EXFORGE PO) Take by mouth daily.           Past Medical History  Diagnosis Date  . HTN (hypertension)   . HLD (hyperlipidemia)   . Arthritis   . Partial deafness   . Clostridium difficile colitis   . GERD (gastroesophageal reflux disease)   . Inguinal hernia     s/p repair 03/2011  . Allergy   . Hx of echocardiogram     a. Echo 11/12:  Mod LVH, EF 55-60%, MAC, mod LAE, mild RAE  . Hx of cardiac catheterization     a. LHC 4/01:  normal coronary arteries  . Depression   . Atrial flutter     a. dx after inguinal hernia repair in 10/12 => seen by Dr. Elease [name]    ROS:   All systems reviewed and negative except as noted in the HPI.   Past Surgical History  Procedure Date  . Appendectomy   . Tonsillectomy   . Renal cyst excision   . Colonoscopy 06/06/2008    severe sigmoid diverticulosis and internal hemorrhoids  . Esophagogastroduodenoscopy 06/06/2008    normal  . Back surgery   . Total hip arthroplasty     bilateral  . Wrist surgery   . Elbow surgery   . Hernia repair 03/23/11    [facility]     Family History  Problem  Relation Age of Onset  . Colon cancer Neg Hx   . Hypertension Father      History   Social History  . Marital Status: Married    Spouse Name: N/A    Number of Children: N/A  . Years of Education: N/A   Occupational History  . Not on file.   Social History Main Topics  . Smoking status: Former Games developer  . Smokeless tobacco: Not on file  . Alcohol Use: No     denies  . Drug Use: No  . Sexually Active: Not on file   Other Topics Concern  . Not on file   Social History Narrative   ** Merged History Encounter **      BP 124/82  Pulse 62  Ht 6\' 3"  (1.905 m)  Wt 249 lb (112.946 kg)  BMI 31.12 kg/m2  SpO2 92%  Physical Exam:  Well appearing NAD HEENT: Unremarkable Neck:  No JVD, no thyromegally Lungs:  Clear with no wheezes, rales, or rhonchi HEART:  IRegular rate rhythm, no murmurs, no rubs, no clicks Abd:  soft, positive bowel sounds, no organomegally,  no rebound, no guarding Ext:  2 plus pulses, no edema, no cyanosis, no clubbing Skin:  No rashes no nodules Neuro:  CN II through XII intact, motor grossly intact  EKG Atrial flutter with a controlled ventricular response  Assess/Plan:

## 2012-03-30 NOTE — Assessment & Plan Note (Signed)
The patient has persistent atrial flutter. He is minimally symptomatic. We discussed the treatment options in detail. Catheter ablation with ultimate discontinuation of anticoagulation has been discussed along with continued medical therapy and rate control. The risk, goals, benefits, and expectations of catheter ablation have been discussed with the patient and he wishes to proceed. Our goal will be to discontinue his systemic anticoagulation one month after catheter ablation, provided he is maintaining sinus rhythm.

## 2012-04-03 ENCOUNTER — Telehealth: Payer: Self-pay | Admitting: Internal Medicine

## 2012-04-03 NOTE — Telephone Encounter (Signed)
New Problem:    Patient called in because he has a procedure on 05/04/12 coming up and he wants to have a face to face conversation with Dr. Ladona Ridgel to address his questions, before going forward with his procedure.  Please call back, patient verbalized that any date and time would be ok.

## 2012-04-03 NOTE — Telephone Encounter (Signed)
Spoke with Toy Cookey She is going to have Nelson call and scheduled apt on 04/17/12

## 2012-04-17 ENCOUNTER — Encounter: Payer: Self-pay | Admitting: Internal Medicine

## 2012-04-17 ENCOUNTER — Ambulatory Visit (INDEPENDENT_AMBULATORY_CARE_PROVIDER_SITE_OTHER): Payer: Medicare Other | Admitting: Internal Medicine

## 2012-04-17 VITALS — BP 134/70 | HR 41 | Ht 75.0 in | Wt 250.4 lb

## 2012-04-17 DIAGNOSIS — I4892 Unspecified atrial flutter: Secondary | ICD-10-CM

## 2012-04-17 DIAGNOSIS — I10 Essential (primary) hypertension: Secondary | ICD-10-CM

## 2012-04-17 NOTE — Assessment & Plan Note (Signed)
Today we discussed the details, risks, goals, benefits, and expectations of catheter ablation of atrial flutter. He wishes to proceed. He will continue his systemic anticoagulation until the procedure.

## 2012-04-17 NOTE — Patient Instructions (Signed)
Your physician recommends that you continue on your current medications as directed. Please refer to the Current Medication list given to you today.   Keep follow as scheduled

## 2012-04-17 NOTE — Progress Notes (Signed)
HPI [name] [name] returns today for followup. He is a 71-year-old man with a history of atrial flutter, who is been maintained on anticoagulation for the last several weeks. He is considering catheter ablation of his atrial flutter. He has a multitude of questions. In the interim, he denies chest pain or shortness of breath. He remains active. No palpitations. No Known Allergies   Current Outpatient Prescriptions  Medication Sig Dispense Refill  . Aliskiren-Hydrochlorothiazide (TEKTURNA HCT) 150-12.5 MG TABS Take 1 tablet by mouth daily.        . apixaban (ELIQUIS) 5 MG TABS tablet Take 1 tablet (5 mg total) by mouth 2 (two) times daily.  60 tablet  11  . esomeprazole (NEXIUM) 40 MG capsule Take 40 mg by mouth daily as needed. For acid reflux      . HYDROcodone-ibuprofen (VICOPROFEN) 7.5-200 MG per tablet Take 1 tablet by mouth every 8 (eight) hours as needed.      . vitamin C (ASCORBIC ACID) 500 MG tablet Take 2,000 mg by mouth daily.       . [DISCONTINUED] Amlodipine Besylate-Valsartan (EXFORGE PO) Take by mouth daily.           Past Medical History  Diagnosis Date  . HTN (hypertension)   . HLD (hyperlipidemia)   . Arthritis   . Partial deafness   . Clostridium difficile colitis   . GERD (gastroesophageal reflux disease)   . Inguinal hernia     s/p repair 03/2011  . Allergy   . Hx of echocardiogram     a. Echo 11/12:  Mod LVH, EF 55-60%, MAC, mod LAE, mild RAE  . Hx of cardiac catheterization     a. LHC 4/01:  normal coronary arteries  . Depression   . Atrial flutter     a. dx after inguinal hernia repair in 10/12 => seen by Dr. Nahser    ROS:   All systems reviewed and negative except as noted in the HPI.   Past Surgical History  Procedure Date  . Appendectomy   . Tonsillectomy   . Renal cyst excision   . Colonoscopy 06/06/2008    severe sigmoid diverticulosis and internal hemorrhoids  . Esophagogastroduodenoscopy 06/06/2008    normal  . Back surgery   . Total hip  arthroplasty     bilateral  . Wrist surgery   . Elbow surgery   . Hernia repair 03/23/11    LIH     Family History  Problem Relation Age of Onset  . Colon cancer Neg Hx   . Hypertension Father      History   Social History  . Marital Status: Married    Spouse Name: N/A    Number of Children: N/A  . Years of Education: N/A   Occupational History  . Not on file.   Social History Main Topics  . Smoking status: Former Smoker  . Smokeless tobacco: Not on file  . Alcohol Use: No     Comment: denies  . Drug Use: No  . Sexually Active: Not on file   Other Topics Concern  . Not on file   Social History Narrative   ** Merged History Encounter **      BP 134/70  Pulse 41  Ht 6' 3" (1.905 m)  Wt 250 lb 6.4 oz (113.581 kg)  BMI 31.30 kg/m2  SpO2 99%  Physical Exam:  Well appearing 71-year-old man, NAD HEENT: Unremarkable Neck:  No JVD, no thyromegally Lungs:  Clear with no wheezes,   rales, or rhonchi. HEART:  Regular rate rhythm, no murmurs, no rubs, no clicks Abd:  soft, positive bowel sounds, no organomegally, no rebound, no guarding Ext:  2 plus pulses, no edema, no cyanosis, no clubbing Skin:  No rashes no nodules Neuro:  CN II through XII intact, motor grossly intact   Assess/Plan:  

## 2012-04-17 NOTE — Assessment & Plan Note (Signed)
Blood pressure today is well controlled. He'll continue his current medical therapy and maintain a low-sodium diet.

## 2012-04-18 ENCOUNTER — Encounter (HOSPITAL_COMMUNITY): Payer: Self-pay | Admitting: Pharmacy Technician

## 2012-04-24 ENCOUNTER — Telehealth: Payer: Self-pay | Admitting: Internal Medicine

## 2012-04-24 ENCOUNTER — Encounter: Payer: Self-pay | Admitting: *Deleted

## 2012-04-24 NOTE — Telephone Encounter (Signed)
New Problem:    Patient called in wanting to know how much risk he will be at to wait to have his procedure done until after Christmas.

## 2012-04-24 NOTE — Telephone Encounter (Signed)
Called pt and he is wanting to wait until Jan but has decided to go ahead and keep the procedure as scheduled

## 2012-04-26 ENCOUNTER — Other Ambulatory Visit (INDEPENDENT_AMBULATORY_CARE_PROVIDER_SITE_OTHER): Payer: Medicare Other

## 2012-04-26 DIAGNOSIS — I4892 Unspecified atrial flutter: Secondary | ICD-10-CM

## 2012-04-26 LAB — CBC WITH DIFFERENTIAL/PLATELET
Basophils Relative: 0.7 % (ref 0.0–3.0)
Eosinophils Relative: 2.5 % (ref 0.0–5.0)
Lymphocytes Relative: 27 % (ref 12.0–46.0)
Monocytes Relative: 10.8 % (ref 3.0–12.0)
Neutrophils Relative %: 59 % (ref 43.0–77.0)
Platelets: 143 10*3/uL — ABNORMAL LOW (ref 150.0–400.0)
RBC: 4.5 Mil/uL (ref 4.22–5.81)
WBC: 3.9 10*3/uL — ABNORMAL LOW (ref 4.5–10.5)

## 2012-04-26 LAB — BASIC METABOLIC PANEL
BUN: 20 mg/dL (ref 6–23)
Creatinine, Ser: 1.3 mg/dL (ref 0.4–1.5)
GFR: 56.14 mL/min — ABNORMAL LOW (ref >60.00)

## 2012-05-01 ENCOUNTER — Other Ambulatory Visit: Payer: Self-pay | Admitting: *Deleted

## 2012-05-01 DIAGNOSIS — I10 Essential (primary) hypertension: Secondary | ICD-10-CM

## 2012-05-01 DIAGNOSIS — I4892 Unspecified atrial flutter: Secondary | ICD-10-CM

## 2012-05-01 MED ORDER — APIXABAN 5 MG PO TABS: 5.0000 mg | ORAL_TABLET | Freq: Two times a day (BID) | ORAL | Status: DC

## 2012-05-03 ENCOUNTER — Encounter (HOSPITAL_COMMUNITY): Payer: Self-pay | Admitting: General Practice

## 2012-05-03 ENCOUNTER — Ambulatory Visit (HOSPITAL_COMMUNITY)
Admission: RE | Admit: 2012-05-03 | Discharge: 2012-05-04 | Disposition: A | Discharge status: Home / Self Care | Payer: Medicare Other | Source: Ambulatory Visit | Attending: Internal Medicine | Admitting: Internal Medicine

## 2012-05-03 ENCOUNTER — Encounter (HOSPITAL_COMMUNITY)
Admission: RE | Disposition: A | Discharge status: Home / Self Care | Payer: Self-pay | Source: Ambulatory Visit | Attending: Internal Medicine

## 2012-05-03 DIAGNOSIS — N189 Chronic kidney disease, unspecified: Secondary | ICD-10-CM

## 2012-05-03 DIAGNOSIS — E785 Hyperlipidemia, unspecified: Secondary | ICD-10-CM | POA: Insufficient documentation

## 2012-05-03 DIAGNOSIS — I4892 Unspecified atrial flutter: Secondary | ICD-10-CM

## 2012-05-03 DIAGNOSIS — Z7901 Long term (current) use of anticoagulants: Secondary | ICD-10-CM | POA: Insufficient documentation

## 2012-05-03 DIAGNOSIS — R194 Change in bowel habit: Secondary | ICD-10-CM

## 2012-05-03 DIAGNOSIS — K5909 Other constipation: Secondary | ICD-10-CM

## 2012-05-03 DIAGNOSIS — Z79899 Other long term (current) drug therapy: Secondary | ICD-10-CM | POA: Insufficient documentation

## 2012-05-03 DIAGNOSIS — I10 Essential (primary) hypertension: Secondary | ICD-10-CM | POA: Insufficient documentation

## 2012-05-03 DIAGNOSIS — Z8719 Personal history of other diseases of the digestive system: Secondary | ICD-10-CM

## 2012-05-03 HISTORY — PX: ATRIAL FLUTTER ABLATION: SHX5733

## 2012-05-03 HISTORY — DX: Calculus of kidney: N20.0

## 2012-05-03 SURGERY — ATRIAL FLUTTER ABLATION
Anesthesia: LOCAL

## 2012-05-03 MED ORDER — SODIUM CHLORIDE 0.9 % IJ SOLN: 3.0000 mL | Freq: Two times a day (BID) | INTRAMUSCULAR | Status: DC

## 2012-05-03 MED ORDER — MIDAZOLAM HCL 5 MG/5ML IJ SOLN
INTRAMUSCULAR | Status: AC
  Filled 2012-05-03: qty 5

## 2012-05-03 MED ORDER — BUPIVACAINE HCL (PF) 0.25 % IJ SOLN
INTRAMUSCULAR | Status: AC
  Filled 2012-05-03: qty 30

## 2012-05-03 MED ORDER — OFF THE BEAT BOOK
Freq: Once | Status: AC
  Administered 2012-05-03: 22:00:00
  Filled 2012-05-03: qty 1

## 2012-05-03 MED ORDER — SODIUM CHLORIDE 0.9 % IJ SOLN: 3.0000 mL | INTRAMUSCULAR | Status: DC | PRN

## 2012-05-03 MED ORDER — ACETAMINOPHEN 325 MG PO TABS: 650.0000 mg | ORAL_TABLET | ORAL | Status: DC | PRN

## 2012-05-03 MED ORDER — HYDROCHLOROTHIAZIDE 12.5 MG PO CAPS
12.5000 mg | ORAL_CAPSULE | Freq: Every day | ORAL | Status: DC
Start: 2012-05-04 — End: 2012-05-04
  Administered 2012-05-04: 10:00:00 12.5 mg via ORAL
  Filled 2012-05-03: qty 1

## 2012-05-03 MED ORDER — ONDANSETRON HCL 4 MG/2ML IJ SOLN: 4.0000 mg | Freq: Four times a day (QID) | INTRAMUSCULAR | Status: DC | PRN

## 2012-05-03 MED ORDER — SODIUM CHLORIDE 0.9 % IV SOLN: 250.0000 mL | INTRAVENOUS | Status: DC | PRN

## 2012-05-03 MED ORDER — FENTANYL CITRATE 0.05 MG/ML IJ SOLN
INTRAMUSCULAR | Status: AC
  Filled 2012-05-03: qty 2

## 2012-05-03 MED ORDER — ALISKIREN-HYDROCHLOROTHIAZIDE 150-12.5 MG PO TABS: 1.0000 | ORAL_TABLET | Freq: Every day | ORAL | Status: DC

## 2012-05-03 MED ORDER — ALISKIREN FUMARATE 150 MG PO TABS
150.0000 mg | ORAL_TABLET | Freq: Every day | ORAL | Status: DC
  Administered 2012-05-04: 150 mg via ORAL
  Filled 2012-05-03: qty 1

## 2012-05-03 MED ORDER — PANTOPRAZOLE SODIUM 40 MG PO TBEC
40.0000 mg | DELAYED_RELEASE_TABLET | Freq: Every day | ORAL | Status: DC
  Administered 2012-05-03: 40 mg via ORAL
  Filled 2012-05-03: qty 1

## 2012-05-03 MED ORDER — APIXABAN 5 MG PO TABS
5.0000 mg | ORAL_TABLET | Freq: Two times a day (BID) | ORAL | Status: DC
Start: 2012-05-03 — End: 2012-05-04
  Administered 2012-05-03 – 2012-05-04 (×2): 5 mg via ORAL
  Filled 2012-05-03 (×3): qty 1

## 2012-05-03 NOTE — Interval H&P Note (Signed)
History and Physical Interval Note:  05/03/2012 10:57 AM  [name] [name].  has presented today for surgery, with the diagnosis of atrial flutter  The various methods of treatment have been discussed with the patient and family. After consideration of risks, benefits and other options for treatment, the patient has consented to  Procedure(s) (LRB) with comments: ATRIAL FLUTTER ABLATION (N/A) as a surgical intervention .  The patient's history has been reviewed, patient examined, no change in status, stable for surgery.  I have reviewed the patient's chart and labs.  Questions were answered to the patient's satisfaction.     Lewayne Bunting

## 2012-05-03 NOTE — Op Note (Signed)
EPS/RFA of atrial flutter without immediate complication. [id number].

## 2012-05-03 NOTE — H&P (View-Only) (Signed)
HPI [name] returns today for followup. He is a 71 year old man with a history of atrial flutter, who is been maintained on anticoagulation for the last several weeks. He is considering catheter ablation of his atrial flutter. He has a multitude of questions. In the interim, he denies chest pain or shortness of breath. He remains active. No palpitations. No Known Allergies   Current Outpatient Prescriptions  Medication Sig Dispense Refill  . Aliskiren-Hydrochlorothiazide (TEKTURNA HCT) 150-12.5 MG TABS Take 1 tablet by mouth daily.        Marland Kitchen apixaban (ELIQUIS) 5 MG TABS tablet Take 1 tablet (5 mg total) by mouth 2 (two) times daily.  60 tablet  11  . esomeprazole (NEXIUM) 40 MG capsule Take 40 mg by mouth daily as needed. For acid reflux      . HYDROcodone-ibuprofen (VICOPROFEN) 7.5-200 MG per tablet Take 1 tablet by mouth every 8 (eight) hours as needed.      . vitamin C (ASCORBIC ACID) 500 MG tablet Take 2,000 mg by mouth daily.       . [DISCONTINUED] Amlodipine Besylate-Valsartan (EXFORGE PO) Take by mouth daily.           Past Medical History  Diagnosis Date  . HTN (hypertension)   . HLD (hyperlipidemia)   . Arthritis   . Partial deafness   . Clostridium difficile colitis   . GERD (gastroesophageal reflux disease)   . Inguinal hernia     s/p repair 03/2011  . Allergy   . Hx of echocardiogram     a. Echo 11/12:  Mod LVH, EF 55-60%, MAC, mod LAE, mild RAE  . Hx of cardiac catheterization     a. LHC 4/01:  normal coronary arteries  . Depression   . Atrial flutter     a. dx after inguinal hernia repair in 10/12 => seen by Dr. Elease Hashimoto    ROS:   All systems reviewed and negative except as noted in the HPI.   Past Surgical History  Procedure Date  . Appendectomy   . Tonsillectomy   . Renal cyst excision   . Colonoscopy 06/06/2008    severe sigmoid diverticulosis and internal hemorrhoids  . Esophagogastroduodenoscopy 06/06/2008    normal  . Back surgery   . Total hip  arthroplasty     bilateral  . Wrist surgery   . Elbow surgery   . Hernia repair 03/23/11    LIH     Family History  Problem Relation Age of Onset  . Colon cancer Neg Hx   . Hypertension Father      History   Social History  . Marital Status: Married    Spouse Name: N/A    Number of Children: N/A  . Years of Education: N/A   Occupational History  . Not on file.   Social History Main Topics  . Smoking status: Former Games developer  . Smokeless tobacco: Not on file  . Alcohol Use: No     Comment: denies  . Drug Use: No  . Sexually Active: Not on file   Other Topics Concern  . Not on file   Social History Narrative   ** Merged History Encounter **      BP 134/70  Pulse 41  Ht 6\' 3"  (1.905 m)  Wt 250 lb 6.4 oz (113.581 kg)  BMI 31.30 kg/m2  SpO2 99%  Physical Exam:  Well appearing 71 year old man, NAD HEENT: Unremarkable Neck:  No JVD, no thyromegally Lungs:  Clear with no wheezes,  rales, or rhonchi. HEART:  Regular rate rhythm, no murmurs, no rubs, no clicks Abd:  soft, positive bowel sounds, no organomegally, no rebound, no guarding Ext:  2 plus pulses, no edema, no cyanosis, no clubbing Skin:  No rashes no nodules Neuro:  CN II through XII intact, motor grossly intact   Assess/Plan:

## 2012-05-04 NOTE — Op Note (Signed)
NAMETAMARIUS, ROSENFIELD NO.:  [id number]  MEDICAL RECORD NO.:  [id number]  LOCATION:  [id number]                         FACILITY:  [facility]  PHYSICIAN:  Doylene Canning. Ladona Ridgel, MD    DATE OF BIRTH:  02/09/1941  DATE OF PROCEDURE:  05/03/2012 DATE OF DISCHARGE:                              OPERATIVE REPORT   PROCEDURE PERFORMED:  Electrophysiologic study and RF catheter ablation of atrial flutter.  INTRODUCTION:  The patient is a 71 year old man with a history of persistent atrial flutter with not much in the way of symptoms.  He has been placed on anticoagulation for over 3 weeks.  He is now referred for catheter ablation of his atrial flutter.  He desires not to be on lifelong anticoagulation.  PROCEDURE:  After informed consent was obtained, the patient was taken to the diagnostic EP lab in a fasting state.  After usual preparation and draping, intravenous fentanyl and midazolam was given for sedation. A 6-French hexapolar catheter was inserted percutaneously in the right jugular vein and advanced to the coronary sinus.  A 6-French quadripolar catheter was inserted percutaneously in the right femoral vein and advanced to the His bundle region.  A 7-French 20-pole Halo catheter was inserted percutaneously in the right femoral vein and advanced to right atrium.  Mapping of the atrial flutter was carried out demonstrating typical counterclockwise tricuspid annular reentry.  A 7-French quadripolar ablation catheter was then maneuvered into the atrial flutter isthmus.  Mapping was carried out.  It was noted that the atrial flutter isthmus was quite large.  They were also very large on electrograms.  This suggested a very thickened atrial myocardium.  A total of 24 RF energy applications were delivered to the atrial flutter isthmus.  During the 10th RF energy application, atrial flutter was terminated and sinus rhythm was restored.  There was initially block but that block  resolved and the patient actually had recurrent atrial flutter.  Subsequent additional RF energy application was then delivered until atrial flutter isthmus block was demonstrated on the 20th catheter ablation.  Four Bonus RF energy applications were then delivered.  The site of successful ablation was approximately 7 o'clock on the tricuspid valve anulus.  The flutter isthmus was quite large and anteriorly displaced, making catheter contact more difficult than usual.  With all of the above and restoration of sinus rhythm, rapid ventricular pacing was then carried out demonstrating VA dissociation at 600 milliseconds. Rapid atrial pacing was carried out demonstrating AV Wenckebach greater than 600 milliseconds.  Programmed atrial stimulation also demonstrated AV Wenckebach greater than 600 milliseconds and programmed ventricular stimulation demonstrated VA Wenckebach at greater than 600 milliseconds. At this point, the catheters were removed.  Hemostasis was assured and the patient was returned to his room in satisfactory condition.  COMPLICATIONS:  There were no immediate procedure complications.  RESULTS:  A.  Baseline ECG.  Baseline ECG demonstrates atrial flutter with slow ventricular response.  Following ablation, sinus rhythm demonstrated sinus bradycardia with one-to-one AV conduction at a sinus rate of 49 to 50 beats per minute. B.  Baseline intervals.  QRS duration was 110 milliseconds.  The atrial flutter cycle length was  260 milliseconds.  HV interval was 65 milliseconds.  Following ablation, the HV interval was unchanged.  Sinus node cycle length was 1230 milliseconds. C.  Rapid ventricular pacing.  Following ablation, rapid ventricular pacing demonstrated VA dissociation at 600 milliseconds. D.  Programmed ventricular stimulation following ablation.  Programmed ventricular stimulation was carried out in the right ventricle demonstrating VA dissociation at 600  milliseconds. E.  Rapid atrial pacing.  Rapid atrial pacing following catheter ablation demonstrated an AV Wenckebach cycle length of greater than 600 milliseconds. F.  Programmed atrial stimulation.  Programmed atrial stimulation following catheter ablation demonstrated an AV Wenckebach cycle length of greater than 600 milliseconds. G.  Arrhythmias observed. 1. Atrial flutter initiation was present at the time of EP study.  The     duration was sustained.  Cycle length was 260 milliseconds.  Method     of termination was with catheter ablation.     a.     Mapping.  Mapping of atrial flutter isthmus demonstrated a      very large atrial flutter isthmus which was anteriorly displaced.     b.     RF energy application.  Total of 24 RF energy applications      were delivered.  During the 10th RF energy application, atrial      flutter was terminated and sinus rhythm restored.  During the 25th      RF energy application, there was atrial flutter isthmus block.      Four Bonus RF energy applications were delivered.  CONCLUSION:  This demonstrated successful electrophysiologic study with catheter ablation of typical atrial flutter with a total of 24 RF energy applications delivered to a very large atrial flutter isthmus. Following the procedure, there was sinus bradycardia, and the patient does have significant underlying AV conduction system disease.  The clinical significance of this is uncertain.     Doylene Canning. Ladona Ridgel, MD     GWT/MEDQ  D:  05/03/2012  T:  05/04/2012  Job:  [id number]  cc:   Lenon Curt. Chilton Si, M.D.

## 2012-05-04 NOTE — Discharge Summary (Signed)
ELECTROPHYSIOLOGY DISCHARGE SUMMARY    Patient ID: [name] [name].,  MRN: [id number], DOB/AGE: November 08, 1940 71 y.o.  Admit date: 05/03/2012 Discharge date: 05/04/2012  Primary Care Physician: Murray Hodgkins, MD Primary Cardiologist: Ladona Ridgel, MD  Primary Discharge Diagnosis:  1. Atrial flutter s/p RF ablation 2. Bradycardia, asymptomatic with intermittent 2nd degree AV block, Wenckebach  Secondary Discharge Diagnoses:  1. HTN 2. Dyslipidemia 3. GERD  Procedures This Admission:  1. EP study +RF ablation of typical atrial flutter RESULTS: A. Baseline ECG. Baseline ECG demonstrates atrial flutter  with slow ventricular response. Following ablation, sinus rhythm  demonstrated sinus bradycardia with one-to-one AV conduction at a sinus  rate of 49 to 50 beats per minute.  B. Baseline intervals. QRS duration was 110 milliseconds. The atrial  flutter cycle length was 260 milliseconds. HV interval was 65  milliseconds. Following ablation, the HV interval was unchanged. Sinus  node cycle length was 1230 milliseconds.  C. Rapid ventricular pacing. Following ablation, rapid ventricular  pacing demonstrated VA dissociation at 600 milliseconds.  D. Programmed ventricular stimulation following ablation. Programmed  ventricular stimulation was carried out in the right ventricle  demonstrating VA dissociation at 600 milliseconds.  E. Rapid atrial pacing. Rapid atrial pacing following catheter  ablation demonstrated an AV Wenckebach cycle length of greater than 600  milliseconds.  F. Programmed atrial stimulation. Programmed atrial stimulation  following catheter ablation demonstrated an AV Wenckebach cycle length  of greater than 600 milliseconds.  G. Arrhythmias observed.  1. Atrial flutter initiation was present at the time of EP study. The  duration was sustained. Cycle length was 260 milliseconds. Method  of termination was with catheter ablation.  a. Mapping. Mapping of atrial flutter  isthmus demonstrated a  very large atrial flutter isthmus which was anteriorly displaced.  b. RF energy application. Total of 24 RF energy applications  were delivered. During the 10th RF energy application, atrial  flutter was terminated and sinus rhythm restored. During the 25th  RF energy application, there was atrial flutter isthmus block.  Four Bonus RF energy applications were delivered.  CONCLUSION: This demonstrated successful electrophysiologic study with  catheter ablation of typical atrial flutter with a total of 24 RF energy  applications delivered to a very large atrial flutter isthmus.  Following the procedure, there was sinus bradycardia, and the patient  does have significant underlying AV conduction system disease. The  clinical significance of this is uncertain.   History and Hospital Course:  [name] [name] is a pleasant 71 year old gentleman with persistent atrial flutter who desires not to be on lifelong anticoagulation and therefore presented yesterday for EP study +RF ablation of his atrial flutter. Please see details as outlined above. [name] [name] tolerated this procedure well without any immediate complication. He has been bradycardic post procedure with intermittent 2nd degree AV block, Wenckebach; however, he is asymptomatic. He remains hemodynamically stable and afebrile. His groin site is intact without significant bleeding or hematoma. He has been given discharge instructions including wound care and activity restrictions. He will follow-up in clinic in 4 weeks. He has been seen, examined and deemed stable for discharge today by Dr. Lewayne Bunting.  Physical Exam: Vitals: Blood pressure 143/67, pulse 50, temperature 97.8 F (36.6 C), temperature source Oral, resp. rate 15, height 6\' 3"  (1.905 m), weight 233 lb 7.5 oz (105.9 kg), SpO2 96.00%.  General: Well developed, well appearing 71 year old male in no acute distress. Heart: RRR. S1, S2 present without murmur, rub or  gallop. Lungs: CTA bilaterally. No wheezes, rales or rhonchi. Abdomen: Soft, nondistended. Extremities: No cyanosis, clubbing or edema. Right groin site intact without bleeding or hematoma. Neuro: Alert and oriented x 3. No focal deficits.  Labs: Lab Results  Component Value Date   WBC 3.9* 04/26/2012   HGB 14.0 04/26/2012   HCT 42.5 04/26/2012   MCV 94.6 04/26/2012   PLT 143.0* 04/26/2012   No results found for this basename: NA,K,CL,CO2,BUN,CREATININE,CALCIUM,LABALBU,PROT,BILITOT,ALKPHOS,ALT,AST,GLUCOSE in the last 168 hours  Basename 05/03/12 1033  INR 1.34    Disposition:  The patient is being discharged in stable condition.  Follow-up:     Follow-up Information    Follow up with Lewayne Bunting, MD. On 06/08/2012. (At 11:15 AM)    Contact information:   [address] [place] [address] [phone]        Discharge Medications:    Medication List     As of 05/04/2012  8:42 AM    TAKE these medications         apixaban 5 MG Tabs tablet   Commonly known as: ELIQUIS   Take 1 tablet (5 mg total) by mouth 2 (two) times daily.      esomeprazole 40 MG capsule   Commonly known as: NEXIUM   Take 40 mg by mouth daily as needed. For acid reflux      HYDROcodone-ibuprofen 7.5-200 MG per tablet   Commonly known as: VICOPROFEN   Take 1 tablet by mouth every 8 (eight) hours as needed. For pain      TEKTURNA HCT 150-12.5 MG Tabs   Generic drug: Aliskiren-Hydrochlorothiazide   Take 1 tablet by mouth daily.      vitamin C 500 MG tablet   Commonly known as: ASCORBIC ACID   Take 2,000 mg by mouth daily.       Duration of Discharge Encounter: Greater than 30 minutes including physician time.  Signed, Rick Duff, PA-C 05/04/2012, 8:42 AM  EP Attending  Patient seen and examined. Agree with above.  Leonia Reeves.D.

## 2012-06-06 ENCOUNTER — Telehealth: Payer: Self-pay | Admitting: Cardiology

## 2012-06-06 NOTE — Telephone Encounter (Signed)
New problem:   Samples of eliquis  5 mg.  Can come by the office on Thursday to pick up.

## 2012-06-06 NOTE — Telephone Encounter (Signed)
Samples provided, pt informed.

## 2012-06-08 ENCOUNTER — Encounter: Payer: Medicare Other | Admitting: Internal Medicine

## 2012-07-04 ENCOUNTER — Ambulatory Visit (INDEPENDENT_AMBULATORY_CARE_PROVIDER_SITE_OTHER): Payer: Medicare Other | Admitting: Internal Medicine

## 2012-07-04 ENCOUNTER — Encounter: Payer: Self-pay | Admitting: Internal Medicine

## 2012-07-04 VITALS — BP 157/76 | HR 83 | Wt 237.0 lb

## 2012-07-04 DIAGNOSIS — I4892 Unspecified atrial flutter: Secondary | ICD-10-CM

## 2012-07-04 DIAGNOSIS — I10 Essential (primary) hypertension: Secondary | ICD-10-CM

## 2012-07-04 DIAGNOSIS — I4891 Unspecified atrial fibrillation: Secondary | ICD-10-CM

## 2012-07-04 MED ORDER — ASPIRIN EC 81 MG PO TBEC: 81.0000 mg | DELAYED_RELEASE_TABLET | Freq: Every day | ORAL | Status: DC

## 2012-07-04 NOTE — Patient Instructions (Addendum)
Your physician recommends that you schedule a follow-up appointment in: 3-4 months with Dr Ladona Ridgel   Your physician has recommended you make the following change in your medication:  1) Stop Eliquis 2) Start ASA 81mg  daily

## 2012-07-04 NOTE — Assessment & Plan Note (Signed)
He is maintaining normal sinus rhythm, status post catheter ablation. I've asked the patient to stop his systemic anticoagulation, and start low-dose aspirin.

## 2012-07-04 NOTE — Progress Notes (Signed)
HPI [name] returns today for followup. He is a very pleasant 72 year old man with atrial flutter who underwent catheter ablation. In the interim, he has been under increased stress. He notes that he has been eating more and has been quite anxious. He has had trouble sleeping. He has gained 7 pounds. He denies syncope or palpitations. No chest pain or shortness of breath. He has not been exercising, but is been anxious about starting back. No Known Allergies   Current Outpatient Prescriptions  Medication Sig Dispense Refill  . Aliskiren-Hydrochlorothiazide (TEKTURNA HCT) 150-12.5 MG TABS Take 1 tablet by mouth daily.        Marland Kitchen esomeprazole (NEXIUM) 40 MG capsule Take 40 mg by mouth daily as needed. For acid reflux      . HYDROcodone-ibuprofen (VICOPROFEN) 7.5-200 MG per tablet Take 1 tablet by mouth every 8 (eight) hours as needed. For pain      . vitamin C (ASCORBIC ACID) 500 MG tablet Take 2,000 mg by mouth daily.       Marland Kitchen aspirin EC 81 MG tablet Take 1 tablet (81 mg total) by mouth daily.  90 tablet  3  . [DISCONTINUED] Amlodipine Besylate-Valsartan (EXFORGE PO) Take by mouth daily.           Past Medical History  Diagnosis Date  . HTN (hypertension)   . Partial deafness   . Clostridium difficile colitis   . GERD (gastroesophageal reflux disease)   . Allergy   . Hx of echocardiogram     a. Echo 11/12:  Mod LVH, EF 55-60%, MAC, mod LAE, mild RAE  . Depression   . Atrial flutter     a. dx after inguinal hernia repair in 10/12 => seen by Dr. Elease Hashimoto  . Arthritis     "right shoulder" (05/03/2012)  . Kidney stone     "they just passed" (05/03/2012)    ROS:   All systems reviewed and negative except as noted in the HPI.   Past Surgical History  Procedure Date  . Appendectomy ~ 1956  . Tonsillectomy ~ 1946  . Renal cyst excision     pt denies this hx on 05/03/2012  . Colonoscopy 06/06/2008    severe sigmoid diverticulosis and internal hemorrhoids  . Esophagogastroduodenoscopy  06/06/2008    normal  . Posterior lumbar fusion 1989  . Total hip arthroplasty 1999-2000    bilateral  . Carpal tunnel with cubital tunnel 2004    "right" (05/03/2012)  . Cardiac catheterization 08/1999; 05/03/2012    normal coronary arteries;   . Inguinal hernia repair 03/23/11    "left" (05/03/2012)     Family History  Problem Relation Age of Onset  . Colon cancer Neg Hx   . Hypertension Father      History   Social History  . Marital Status: Married    Spouse Name: N/A    Number of Children: N/A  . Years of Education: N/A   Occupational History  . Not on file.   Social History Main Topics  . Smoking status: Former Smoker -- 2.0 packs/day for 5 years    Types: Cigarettes  . Smokeless tobacco: Never Used     Comment: 05/03/2012 "smoked from age 36 to 45"  . Alcohol Use: No     Comment: denies  . Drug Use: No  . Sexually Active: Yes   Other Topics Concern  . Not on file   Social History Narrative   ** Merged History Encounter **  BP 157/76  Pulse 83  Wt 237 lb (107.502 kg)  Physical Exam:  Well appearing NAD HEENT: Unremarkable Neck:  No JVD, no thyromegally Lungs:  Clear with no wheezes, rales, or rhonchi. HEART:  Regular rate rhythm, no murmurs, no rubs, no clicks Abd:  soft, positive bowel sounds, no organomegally, no rebound, no guarding Ext:  2 plus pulses, no edema, no cyanosis, no clubbing Skin:  No rashes no nodules Neuro:  CN II through XII intact, motor grossly intact  EKG normal sinus rhythm. First degree avblock.  Assess/Plan:

## 2012-07-04 NOTE — Assessment & Plan Note (Signed)
His blood pressure is somewhat elevated. He missed to sodium indiscretion. I've asked the patient to start exercising daily, reduce his salt in his diet, and lose weight. I instructed him to followup with his primary physician, Dr. Chilton Si for additional blood pressure followup.

## 2012-08-30 ENCOUNTER — Ambulatory Visit: Payer: Self-pay | Admitting: Internal Medicine

## 2012-10-04 ENCOUNTER — Encounter: Payer: Self-pay | Admitting: Internal Medicine

## 2012-10-04 ENCOUNTER — Ambulatory Visit (INDEPENDENT_AMBULATORY_CARE_PROVIDER_SITE_OTHER): Payer: Medicare Other | Admitting: Internal Medicine

## 2012-10-04 VITALS — BP 148/78 | HR 66 | Temp 97.8°F

## 2012-10-04 DIAGNOSIS — I10 Essential (primary) hypertension: Secondary | ICD-10-CM

## 2012-10-04 DIAGNOSIS — S3120XA Unspecified open wound of penis, initial encounter: Secondary | ICD-10-CM | POA: Insufficient documentation

## 2012-10-04 DIAGNOSIS — L97511 Non-pressure chronic ulcer of other part of right foot limited to breakdown of skin: Secondary | ICD-10-CM

## 2012-10-04 DIAGNOSIS — L97519 Non-pressure chronic ulcer of other part of right foot with unspecified severity: Secondary | ICD-10-CM | POA: Insufficient documentation

## 2012-10-04 DIAGNOSIS — M2040 Other hammer toe(s) (acquired), unspecified foot: Secondary | ICD-10-CM

## 2012-10-04 DIAGNOSIS — L97509 Non-pressure chronic ulcer of other part of unspecified foot with unspecified severity: Secondary | ICD-10-CM

## 2012-10-04 DIAGNOSIS — M2041 Other hammer toe(s) (acquired), right foot: Secondary | ICD-10-CM

## 2012-10-04 MED ORDER — MUPIROCIN 2 % EX OINT: TOPICAL_OINTMENT | CUTANEOUS | Status: DC

## 2012-10-04 NOTE — Patient Instructions (Signed)
Try to find a bandage that protects the tip of your toe. Try Micardis / Hct 80/12.5 daily when you use up your aliskiren/ Hct 50/ 125.

## 2012-10-04 NOTE — Progress Notes (Signed)
  Subjective:    Patient ID: [name] Jim., male    DOB: 07-29-1940, 72 y.o.   MRN: [id number]  HPI Injured penis. Occurred during sex with his wife. Has a non healing area behind the frenulum  Painful ulcer at the tip of the right middle toe. Developing a hammer toe.  Hypertension is reasonably well controlled, but the Danie Chandler that he currently uses is expensive. He would like to try a generic drug similar to this if possible.  Review of Systems  Constitutional: Negative.   HENT: Positive for hearing loss. Negative for ear pain.   Eyes: Negative.   Respiratory: Negative.   Cardiovascular: Negative.        Patient has hypertension.   Gastrointestinal: Negative.   Endocrine: Negative.   Genitourinary:       Painful nonhealing area which is penis and is at the frenulum as well as at the corona nearly circumferentially.  Musculoskeletal: Negative.   Skin:       Lesion of penis as noted above.  Neurological: Negative.   Hematological: Negative.   Psychiatric/Behavioral: Negative.        Objective:   Physical Exam  Constitutional: He is oriented to person, place, and time. He appears well-developed and well-nourished. No distress.  Moderately overweight  HENT:  Head: Normocephalic and atraumatic.  Significant partial deafness bilaterally.  Eyes: Conjunctivae and EOM are normal. Pupils are equal, round, and reactive to light.  Neck: Normal range of motion. Neck supple. No JVD present. No tracheal deviation present. No thyromegaly present.  Cardiovascular: Normal rate, regular rhythm, normal heart sounds and intact distal pulses.  Exam reveals no gallop and no friction rub.   No murmur heard. Pulmonary/Chest: Effort normal and breath sounds normal. No respiratory distress. He has no wheezes. He has no rales. He exhibits no tenderness.  Abdominal: Soft. Bowel sounds are normal. He exhibits no distension and no mass. There is no tenderness.  Genitourinary:  Circumferential  nonhealing area at the frenulum penis as well as in the corona.  Musculoskeletal: Normal range of motion. He exhibits no edema and no tenderness.  Hammertoes developing at the right third toe. The tip of his toe has an ulceration and ulcers covered by dense and heavy callus.  Lymphadenopathy:    He has no cervical adenopathy.  Neurological: He is alert and oriented to person, place, and time. No cranial nerve deficit.  Skin: Skin is dry. No rash noted. No erythema. No pallor.  Psychiatric: He has a normal mood and affect. His behavior is normal. Judgment and thought content normal.          Assessment & Plan:  1. Open wound of penis, initial encounter Nonhealing area to hopefully respond to the antibiotic ointment. Bactroban prescribed period  2. Essential hypertension, benign Reasonably good control. Patient was given samples of Micardis HCT 80/12.5 to be taken one daily.  3. Hammertoe, right The device was given in regards to protecting the ulcer to. If it does not respond well, then referral to podiatrist for correction of the hammertoe would be appropriate.  4. Ulcer of toe of right foot, limited to breakdown of skin The area was sharply freed in the clinic today. A significant amount of overlying callous was taken off. The ulcerated area was exposed. No bleeding occurred.

## 2012-10-09 ENCOUNTER — Telehealth: Payer: Self-pay | Admitting: Internal Medicine

## 2012-10-09 NOTE — Telephone Encounter (Signed)
Returned call to patient he stated he has gained weight and wants to know if okay with Dr.Taylor to start a exercise program that will be supervised by a personal trainer.Message sent to Dr.Taylor.

## 2012-10-09 NOTE — Telephone Encounter (Signed)
New problem     Any thoughts or concern from MD    Patient starting a cardiac pulmonary exercise - personal trainer.

## 2012-10-10 NOTE — Telephone Encounter (Signed)
Ok to exercise. GT

## 2012-10-11 NOTE — Telephone Encounter (Signed)
Returned call to patient Dr.Taylor advised ok to exercise.

## 2012-11-07 ENCOUNTER — Other Ambulatory Visit: Payer: Self-pay | Admitting: Internal Medicine

## 2012-11-12 ENCOUNTER — Other Ambulatory Visit: Payer: Self-pay | Admitting: Internal Medicine

## 2012-11-21 ENCOUNTER — Ambulatory Visit: Payer: Medicare Other | Admitting: Internal Medicine

## 2012-11-22 ENCOUNTER — Other Ambulatory Visit: Payer: Self-pay | Admitting: Orthopaedic Surgery

## 2012-11-22 DIAGNOSIS — M25511 Pain in right shoulder: Secondary | ICD-10-CM

## 2012-11-26 ENCOUNTER — Other Ambulatory Visit: Payer: Medicare Other

## 2012-12-04 ENCOUNTER — Ambulatory Visit
Admission: RE | Admit: 2012-12-04 | Discharge: 2012-12-04 | Disposition: A | Discharge status: Home / Self Care | Payer: Medicare Other | Source: Ambulatory Visit | Attending: Orthopaedic Surgery | Admitting: Orthopaedic Surgery

## 2012-12-04 DIAGNOSIS — M25511 Pain in right shoulder: Secondary | ICD-10-CM

## 2013-01-01 ENCOUNTER — Encounter: Payer: Self-pay | Admitting: Internal Medicine

## 2013-01-01 DIAGNOSIS — I10 Essential (primary) hypertension: Secondary | ICD-10-CM | POA: Insufficient documentation

## 2013-01-01 DIAGNOSIS — H9190 Unspecified hearing loss, unspecified ear: Secondary | ICD-10-CM | POA: Insufficient documentation

## 2013-01-01 DIAGNOSIS — E669 Obesity, unspecified: Secondary | ICD-10-CM | POA: Insufficient documentation

## 2013-01-02 ENCOUNTER — Encounter: Payer: Self-pay | Admitting: Internal Medicine

## 2013-01-02 ENCOUNTER — Ambulatory Visit (INDEPENDENT_AMBULATORY_CARE_PROVIDER_SITE_OTHER): Payer: Medicare Other | Admitting: Internal Medicine

## 2013-01-02 VITALS — BP 150/82 | HR 56 | Temp 99.6°F | Resp 16 | Ht 75.0 in | Wt 225.2 lb

## 2013-01-02 DIAGNOSIS — E669 Obesity, unspecified: Secondary | ICD-10-CM

## 2013-01-02 DIAGNOSIS — I10 Essential (primary) hypertension: Secondary | ICD-10-CM

## 2013-01-02 DIAGNOSIS — H9190 Unspecified hearing loss, unspecified ear: Secondary | ICD-10-CM

## 2013-01-02 DIAGNOSIS — H9193 Unspecified hearing loss, bilateral: Secondary | ICD-10-CM

## 2013-01-02 DIAGNOSIS — M25511 Pain in right shoulder: Secondary | ICD-10-CM

## 2013-01-02 DIAGNOSIS — M25519 Pain in unspecified shoulder: Secondary | ICD-10-CM

## 2013-01-02 MED ORDER — METHADONE HCL 5 MG PO TABS: ORAL_TABLET | ORAL | Status: DC

## 2013-01-02 NOTE — Patient Instructions (Signed)
Do not drive when you are using methadone. Plan to see Dr. Dion Saucier about your shoulder.

## 2013-01-02 NOTE — Progress Notes (Signed)
Subjective:    Patient ID: Unknown [name] [name]., male    DOB: 10-Nov-1940, 72 y.o.   MRN: [id number]  HPI  Right shoulder pain. Has seen 3 orthopedists: [facility], and [facility]. Dr Ave Filter said he would not attempt surgery. Patient is seeking another opinion and pain relief.  12/04/12 MRI right shoulder: 1. Complete supraspinatus tendon tear with 1.5-2.5 cm of  retraction and mild atrophy.  2. Large partial tear of the subscapularis with 1-2 cm of  retraction and marked subscapularis atrophy.  3. Complete tear of the biceps tendon from the superior labrum.  4. Bulky acromioclavicular osteoarthritis.  5. Subacromial/subdeltoid bursitis.     He is exercising, dieting and losing weight.  Current Outpatient Prescriptions on File Prior to Visit  Medication Sig Dispense Refill  . aspirin EC 81 MG tablet Take 1 tablet (81 mg total) by mouth daily.  90 tablet  3  . esomeprazole (NEXIUM) 40 MG capsule Take 40 mg by mouth daily as needed. For acid reflux      . HYDROcodone-ibuprofen (VICOPROFEN) 7.5-200 MG per tablet TAKE 1 TABLET BY MOUTH UP TO 4 TIMES DAILY FOR PAIN  120 tablet  1  . mupirocin ointment (BACTROBAN) 2 % Apply twice daily to the lesion on the penis  22 g  0  . TEKTURNA HCT 150-12.5 MG TABS TAKE 1 TABLET EVERY DAY  30 each  6  . vitamin C (ASCORBIC ACID) 500 MG tablet Take 2,000 mg by mouth daily.       . [DISCONTINUED] Amlodipine Besylate-Valsartan (EXFORGE PO) Take by mouth daily.         No current facility-administered medications on file prior to visit.     Review of Systems  Constitutional: Negative.   HENT: Positive for hearing loss. Negative for ear pain.   Eyes: Negative.   Respiratory: Negative.   Cardiovascular: Negative.        Patient has hypertension.   Gastrointestinal: Negative.   Endocrine: Negative.   Musculoskeletal:       Shoulder pains, worse on the right.  Neurological: Negative.   Hematological: Negative.   Psychiatric/Behavioral:  Negative.        Objective:BP 150/82  Pulse 56  Temp(Src) 99.6 F (37.6 C) (Oral)  Resp 16  Ht 6\' 3"  (1.905 m)  Wt 225 lb 3.2 oz (102.15 kg)  BMI 28.15 kg/m2  SpO2 97%    Physical Exam  Constitutional: He is oriented to person, place, and time. He appears well-developed and well-nourished. No distress.  Moderately overweight  HENT:  Head: Normocephalic and atraumatic.  Significant partial deafness bilaterally.  Eyes: Conjunctivae and EOM are normal. Pupils are equal, round, and reactive to light.  Neck: Normal range of motion. Neck supple. No JVD present. No tracheal deviation present. No thyromegaly present.  Cardiovascular: Normal rate, regular rhythm, normal heart sounds and intact distal pulses.  Exam reveals no gallop and no friction rub.   No murmur heard. Pulmonary/Chest: Effort normal and breath sounds normal. No respiratory distress. He has no wheezes. He has no rales. He exhibits no tenderness.  Abdominal: Soft. Bowel sounds are normal. He exhibits no distension and no mass. There is no tenderness.  Musculoskeletal: He exhibits no edema and no tenderness.  Shoulder pain at rest and with movement Raising the right arm or rotational movement through the shoulder are very painful.  Lymphadenopathy:    He has no cervical adenopathy.  Neurological: He is alert and oriented to person, place, and time. No  cranial nerve deficit.  Skin: Skin is dry. No rash noted. No erythema. No pallor.  Psychiatric: He has a normal mood and affect. His behavior is normal. Judgment and thought content normal.          Assessment & Plan:  Pain in joint, shoulder region, right - Plan: Ambulatory referral to Orthopedic Surgery, methadone (DOLOPHINE) 5 MG tablet  HTN (hypertension); controlled  Partial deafness, bilateral: unchanged  Obesity, unspecified: losing weight

## 2013-01-10 ENCOUNTER — Ambulatory Visit: Payer: Self-pay | Admitting: Internal Medicine

## 2013-01-17 ENCOUNTER — Ambulatory Visit: Payer: Medicare Other | Admitting: Internal Medicine

## 2013-01-18 ENCOUNTER — Other Ambulatory Visit: Payer: Self-pay | Admitting: Internal Medicine

## 2013-01-22 ENCOUNTER — Other Ambulatory Visit: Payer: Self-pay | Admitting: Internal Medicine

## 2013-02-27 ENCOUNTER — Ambulatory Visit: Payer: Self-pay | Admitting: Internal Medicine

## 2013-02-27 ENCOUNTER — Ambulatory Visit: Payer: Medicare Other | Admitting: Internal Medicine

## 2013-03-03 ENCOUNTER — Other Ambulatory Visit: Payer: Self-pay | Admitting: Internal Medicine

## 2013-03-16 ENCOUNTER — Encounter: Payer: Self-pay | Admitting: Internal Medicine

## 2013-03-16 ENCOUNTER — Ambulatory Visit (INDEPENDENT_AMBULATORY_CARE_PROVIDER_SITE_OTHER): Payer: Medicare Other | Admitting: Internal Medicine

## 2013-03-16 VITALS — BP 153/78 | HR 64 | Ht 75.0 in | Wt 226.4 lb

## 2013-03-16 DIAGNOSIS — I4892 Unspecified atrial flutter: Secondary | ICD-10-CM

## 2013-03-16 DIAGNOSIS — I10 Essential (primary) hypertension: Secondary | ICD-10-CM

## 2013-03-16 MED ORDER — HYDROCHLOROTHIAZIDE 25 MG PO TABS: 12.5000 mg | ORAL_TABLET | Freq: Two times a day (BID) | ORAL | Status: DC

## 2013-03-16 MED ORDER — LOSARTAN POTASSIUM 50 MG PO TABS: 50.0000 mg | ORAL_TABLET | Freq: Every day | ORAL | Status: DC

## 2013-03-16 MED ORDER — HYDROCHLOROTHIAZIDE 25 MG PO TABS: 12.5000 mg | ORAL_TABLET | Freq: Every day | ORAL | Status: DC

## 2013-03-16 NOTE — Patient Instructions (Addendum)
Your physician wants you to follow-up in: 12 months with Dr Court Joy will receive a reminder letter in the mail two months in advance. If you don't receive a letter, please call our office to schedule the follow-up appointment.   Your physician has recommended you make the following change in your medication:  1) Stop Tekterna 2) Start Losartan 50mg  daily 3) Start  HCTZ 25mg  1/2 tablet daily

## 2013-03-16 NOTE — Assessment & Plan Note (Signed)
His blood pressure is elevated today. He has complained about the cost of his blood pressure medications. I've asked the patient to start losartan 50 mg daily and hydrochlorothiazide 12.5 mg daily. He'll discontinue Tekturna/HCTZ.

## 2013-03-16 NOTE — Progress Notes (Signed)
HPI [name] [name] returns today for followup. He is a very pleasant 72 year old man with atrial flutter who underwent catheter ablation. In the interim, he has been stable. He denies syncope or palpitations. No chest pain or shortness of breath. He has begun walking several miles 5-6 times a week. He denies chest pain or peripheral edema. No shortness of breath with exertion. Allergies  Allergen Reactions  . Amitriptyline   . Azithromycin     Stomach pain  . Flexeril [Cyclobenzaprine]     Causes dizziness     Current Outpatient Prescriptions  Medication Sig Dispense Refill  . aspirin EC 81 MG tablet Take 1 tablet (81 mg total) by mouth daily.  90 tablet  3  . HYDROcodone-ibuprofen (VICOPROFEN) 7.5-200 MG per tablet TAKE 1 TABLET BY MOUTH UP TO 4 TIMES A DAY AS NEEDED FOR PAIN  120 tablet  1  . oxyCODONE-acetaminophen (PERCOCET/ROXICET) 5-325 MG per tablet       . TEKTURNA HCT 150-12.5 MG TABS TAKE 1 TABLET EVERY DAY  30 each  6  . vitamin C (ASCORBIC ACID) 500 MG tablet Take 2,000 mg by mouth daily.       . [DISCONTINUED] Amlodipine Besylate-Valsartan (EXFORGE PO) Take by mouth daily.         No current facility-administered medications for this visit.     Past Medical History  Diagnosis Date  . HTN (hypertension)   . Partial deafness   . Clostridium difficile colitis   . GERD (gastroesophageal reflux disease)   . Allergy   . Hx of echocardiogram     a. Echo 11/12:  Mod LVH, EF 55-60%, MAC, mod LAE, mild RAE  . Depression   . Atrial flutter     a. dx after inguinal hernia repair in 10/12 => seen by Dr. Elease Hashimoto  . Arthritis     "right shoulder" (05/03/2012)  . Kidney stone     "they just passed" (05/03/2012)  . Allergic rhinitis due to pollen   . Pain in joint, pelvic region and thigh   . Complications affecting other specified body systems, hypertension   . Other specified cardiac dysrhythmias(427.89)   . Rash and other nonspecific skin eruption   . Sacroiliitis, not elsewhere  classified   . Obesity, unspecified   . Inguinal hernia without mention of obstruction or gangrene, unilateral or unspecified, (not specified as recurrent)   . Intestinal infection due to Clostridium difficile   . Irritable bowel syndrome   . Special screening for malignant neoplasm of prostate   . Unspecified hereditary and idiopathic peripheral neuropathy   . Sacroiliitis, not elsewhere classified   . Spasm of muscle   . Cervicalgia   . Degeneration of cervical intervertebral disc   . Pain in joint, ankle and foot   . Cervicalgia   . Pain in joint, shoulder region   . Nonspecific abnormal electrocardiogram (ECG) (EKG)   . Calculus of kidney   . Depressive disorder, not elsewhere classified   . Unspecified constipation   . Pain in joint, lower leg   . Anxiety state, unspecified   . Other malaise and fatigue   . Insomnia, unspecified   . Lumbago   . Coronary atherosclerosis of unspecified type of vessel, native or graft   . Nonspecific abnormal results of pulmonary system function study   . Impotence of organic origin   . Osteoarthrosis, unspecified whether generalized or localized, unspecified site     ROS:   All systems reviewed and negative except as  noted in the HPI.   Past Surgical History  Procedure Laterality Date  . Appendectomy  ~ 1956  . Tonsillectomy  ~ 1946  . Renal cyst excision      pt denies this hx on 05/03/2012  . Colonoscopy  06/06/2008    severe sigmoid diverticulosis and internal hemorrhoids  . Esophagogastroduodenoscopy  06/06/2008    normal  . Posterior lumbar fusion  1989  . Total hip arthroplasty  1999-2000    bilateral  . Carpal tunnel with cubital tunnel  2004    "right" (05/03/2012)  . Cardiac catheterization  08/1999; 05/03/2012    normal coronary arteries;   . Inguinal hernia repair  03/23/11    "left" (05/03/2012)  . Spine surgery  1969    Pantopaque Myelography and spinal infusion- Dr. Eligha Bridegroom  . Reconstruction (r) foot surgery      DR SUE    . Right knee  1961 & 1963     Family History  Problem Relation Age of Onset  . Colon cancer Neg Hx   . Hypertension Father      History   Social History  . Marital Status: Married    Spouse Name: N/A    Number of Children: N/A  . Years of Education: N/A   Occupational History  . Not on file.   Social History Main Topics  . Smoking status: Former Smoker -- 2.00 packs/day for 5 years    Types: Cigarettes  . Smokeless tobacco: Never Used     Comment: 05/03/2012 "smoked from age 42 to 74"  . Alcohol Use: No     Comment: denies  . Drug Use: No  . Sexual Activity: Yes    Partners: Female   Other Topics Concern  . Not on file   Social History Narrative   ** Merged History Encounter **         BP 153/78  Pulse 64  Ht 6\' 3"  (1.905 m)  Wt 226 lb 6.4 oz (102.694 kg)  BMI 28.3 kg/m2  Physical Exam:  Well appearing  72 year old man,NAD HEENT: Unremarkable Neck:  No JVD, no thyromegally Lungs:  Clear with no wheezes, rales, or rhonchi. HEART:  Regular rate rhythm, no murmurs, no rubs, no clicks Abd:  soft, positive bowel sounds, no organomegally, no rebound, no guarding Ext:  2 plus pulses, no edema, no cyanosis, no clubbing Skin:  No rashes no nodules Neuro:  CN II through XII intact, motor grossly intact  Assess/Plan:

## 2013-03-16 NOTE — Assessment & Plan Note (Signed)
He has had no recurrent atrial arrhythmias. He will undergo watchful waiting. 

## 2013-03-21 ENCOUNTER — Ambulatory Visit: Payer: Self-pay | Admitting: Internal Medicine

## 2013-03-23 ENCOUNTER — Other Ambulatory Visit: Payer: Self-pay | Admitting: *Deleted

## 2013-03-23 MED ORDER — HYDROCODONE-IBUPROFEN 7.5-200 MG PO TABS: ORAL_TABLET | ORAL | Status: DC

## 2013-04-09 ENCOUNTER — Ambulatory Visit: Payer: Self-pay | Admitting: Internal Medicine

## 2013-04-10 ENCOUNTER — Encounter: Payer: Self-pay | Admitting: Internal Medicine

## 2013-04-10 ENCOUNTER — Ambulatory Visit (INDEPENDENT_AMBULATORY_CARE_PROVIDER_SITE_OTHER): Payer: Medicare Other | Admitting: Internal Medicine

## 2013-04-10 VITALS — BP 150/86 | HR 72 | Resp 12

## 2013-04-10 DIAGNOSIS — M25519 Pain in unspecified shoulder: Secondary | ICD-10-CM

## 2013-04-10 DIAGNOSIS — I10 Essential (primary) hypertension: Secondary | ICD-10-CM

## 2013-04-10 DIAGNOSIS — J069 Acute upper respiratory infection, unspecified: Secondary | ICD-10-CM

## 2013-04-10 MED ORDER — OXYCODONE HCL 30 MG PO TABS: 30.0000 mg | ORAL_TABLET | ORAL | Status: DC | PRN

## 2013-04-10 NOTE — Patient Instructions (Signed)
Stop hydrocodone. Use oxycodone.

## 2013-04-10 NOTE — Progress Notes (Signed)
Subjective:    Patient ID: [name] Jim., male    DOB: 01/02/1941, 72 y.o.   MRN: [id number]  Chief Complaint  Patient presents with  . Medical Managment of Chronic Issues    2 month follow-up   . URI    Cough and sore throat  . Medication Management    Pain medication is not helping, discuss alternative     HPI Has seen 4 orthopedists ([facility], [facility], and another) about his shoulders, but he says none want to do surgery. Current pain medication is not giving sufficient relief. Worse at night when he lies down.  Sick since 04/07/13 with respiratory infection. No fever. Started with sore throat. Coughing, but nonproductive.  BP is controlled  Current Outpatient Prescriptions on File Prior to Visit  Medication Sig Dispense Refill  . aspirin EC 81 MG tablet Take 1 tablet (81 mg total) by mouth daily.  90 tablet  3  . HYDROcodone-ibuprofen (VICOPROFEN) 7.5-200 MG per tablet Take one tablet by mouth up to 4 times daily as needed for pain  120 tablet  0  . losartan (COZAAR) 50 MG tablet Take 1 tablet (50 mg total) by mouth daily.  90 tablet  3  . vitamin C (ASCORBIC ACID) 500 MG tablet Take 2,000 mg by mouth daily.       . [DISCONTINUED] Amlodipine Besylate-Valsartan (EXFORGE PO) Take by mouth daily.         No current facility-administered medications on file prior to visit.    Review of Systems  Constitutional: Negative.   HENT: Positive for hearing loss. Negative for ear pain.   Eyes: Negative.   Respiratory: Negative.   Cardiovascular: Negative.        Patient has hypertension.   Gastrointestinal: Negative.   Endocrine: Negative.   Musculoskeletal:       Shoulder pains, worse on the right.  Neurological: Negative.   Hematological: Negative.   Psychiatric/Behavioral: Negative.        Objective:BP 150/86  Pulse 72  Resp 12  SpO2 98%    Physical Exam  Constitutional: He is oriented to person, place, and time. He appears well-developed and well-nourished.  No distress.  Moderately overweight  HENT:  Head: Normocephalic and atraumatic.  Significant partial deafness bilaterally.  Eyes: Conjunctivae and EOM are normal. Pupils are equal, round, and reactive to light.  Neck: Normal range of motion. Neck supple. No JVD present. No tracheal deviation present. No thyromegaly present.  Cardiovascular: Normal rate, regular rhythm, normal heart sounds and intact distal pulses.  Exam reveals no gallop and no friction rub.   No murmur heard. Pulmonary/Chest: Effort normal and breath sounds normal. No respiratory distress. He has no wheezes. He has no rales. He exhibits no tenderness.  Abdominal: Soft. Bowel sounds are normal. He exhibits no distension and no mass. There is no tenderness.  Musculoskeletal: He exhibits no edema and no tenderness.  Shoulder pain at rest and with movement Raising the right arm or rotational movement through the shoulder are very painful.  Lymphadenopathy:    He has no cervical adenopathy.  Neurological: He is alert and oriented to person, place, and time. No cranial nerve deficit.  Skin: Skin is dry. No rash noted. No erythema. No pallor.  Psychiatric: He has a normal mood and affect. His behavior is normal. Judgment and thought content normal.          Assessment & Plan:  Pain in joint, shoulder region, unspecified laterality - Plan: oxycodone (ROXICODONE)  30 MG immediate release tablet  HTN (hypertension): controlled  Acute upper respiratory infections of unspecified site: symptomatic relief.

## 2013-05-22 ENCOUNTER — Ambulatory Visit (INDEPENDENT_AMBULATORY_CARE_PROVIDER_SITE_OTHER): Payer: Medicare Other | Admitting: Internal Medicine

## 2013-05-22 ENCOUNTER — Encounter: Payer: Self-pay | Admitting: Internal Medicine

## 2013-05-22 VITALS — BP 146/80 | HR 61 | Temp 97.4°F | Resp 10

## 2013-05-22 DIAGNOSIS — S61459A Open bite of unspecified hand, initial encounter: Secondary | ICD-10-CM | POA: Insufficient documentation

## 2013-05-22 DIAGNOSIS — S61452S Open bite of left hand, sequela: Secondary | ICD-10-CM

## 2013-05-22 DIAGNOSIS — M25519 Pain in unspecified shoulder: Secondary | ICD-10-CM

## 2013-05-22 DIAGNOSIS — IMO0002 Reserved for concepts with insufficient information to code with codable children: Secondary | ICD-10-CM

## 2013-05-22 MED ORDER — OXYCODONE HCL 30 MG PO TABS: 30.0000 mg | ORAL_TABLET | ORAL | Status: DC | PRN

## 2013-05-22 MED ORDER — AMOXICILLIN-POT CLAVULANATE 875-125 MG PO TABS: 1.0000 | ORAL_TABLET | Freq: Two times a day (BID) | ORAL | Status: DC

## 2013-05-22 NOTE — Progress Notes (Signed)
Patient ID: [name] Jim., male   DOB: [birth year], 72 y.o.   MRN: [id number]    Chief Complaint  Patient presents with  . Animal Bite    Cat bite(patient's cat) 3 weeks ago on left wirst. Patient was seen at Urgent Care (rx'ed Amox 875-125 x 10 days)   . Medication Refill    Renew RX for Oxycodone (last filled 04/10/2013)     Allergies  Allergen Reactions  . Amitriptyline   . Azithromycin     Stomach pain  . Flexeril [Cyclobenzaprine]     Causes dizziness    HPI 72 y/o male patient is here for follow up post cat bite to his right wrist area. He has completed 10 days course of augmentin with some improvement but is concerned since the wound has not resolved and feels the area to be red and slightly tender. Remains afebrile Does not remember when his last tetanus shot was No other complaints  ROS Negative except for the ones in HPI  Past Medical History  Diagnosis Date  . HTN (hypertension)   . Partial deafness   . Clostridium difficile colitis   . GERD (gastroesophageal reflux disease)   . Allergy   . Hx of echocardiogram     a. Echo 11/12:  Mod LVH, EF 55-60%, MAC, mod LAE, mild RAE  . Depression   . Atrial flutter     a. dx after inguinal hernia repair in 10/12 => seen by Dr. Elease Hashimoto  . Arthritis     "right shoulder" (05/03/2012)  . Kidney stone     "they just passed" (05/03/2012)  . Allergic rhinitis due to pollen   . Pain in joint, pelvic region and thigh   . Complications affecting other specified body systems, hypertension   . Other specified cardiac dysrhythmias(427.89)   . Rash and other nonspecific skin eruption   . Sacroiliitis, not elsewhere classified   . Obesity, unspecified   . Inguinal hernia without mention of obstruction or gangrene, unilateral or unspecified, (not specified as recurrent)   . Intestinal infection due to Clostridium difficile   . Irritable bowel syndrome   . Special screening for malignant neoplasm of prostate   . Unspecified  hereditary and idiopathic peripheral neuropathy   . Sacroiliitis, not elsewhere classified   . Spasm of muscle   . Cervicalgia   . Degeneration of cervical intervertebral disc   . Pain in joint, ankle and foot   . Cervicalgia   . Pain in joint, shoulder region   . Nonspecific abnormal electrocardiogram (ECG) (EKG)   . Calculus of kidney   . Depressive disorder, not elsewhere classified   . Unspecified constipation   . Pain in joint, lower leg   . Anxiety state, unspecified   . Other malaise and fatigue   . Insomnia, unspecified   . Lumbago   . Coronary atherosclerosis of unspecified type of vessel, native or graft   . Nonspecific abnormal results of pulmonary system function study   . Impotence of organic origin   . Osteoarthrosis, unspecified whether generalized or localized, unspecified site    Medication reviewed. See MAR  BP 146/80  Pulse 61  Temp(Src) 97.4 F (36.3 C) (Oral)  Resp 10  SpO2 98%  gen- elderly male in NAD Extremities- has redness on right wrist with a small puncture site, no drainage, good radial pulse, non tender, no limitation in ROM on right wrist, no crepitus Neck - no cervical lymphadenopathy Axilla- no axillary lymphadenopathy  Assessment/plan  1. Pain in joint, shoulder region, unspecified laterality Refill on his pain med provided - oxycodone (ROXICODONE) 30 MG immediate release tablet; Take 1 tablet (30 mg total) by mouth every 4 (four) hours as needed for pain.  Dispense: 180 tablet; Refill: 0  2. Cat bite of hand, left, sequela With erythema and tenderness, will provide 1 additional week of augmentin. Keep the area clean and dry. No enlarged lymph node, no signs of deeper tissue or bone involvement. uptodate with tdap in 06/2011. Monitor for drainage from the wound site or fever. Keep wound area open and dry to air for now

## 2013-06-06 ENCOUNTER — Encounter: Payer: Medicare Other | Admitting: Internal Medicine

## 2013-06-11 ENCOUNTER — Ambulatory Visit (INDEPENDENT_AMBULATORY_CARE_PROVIDER_SITE_OTHER): Payer: Medicare Other | Admitting: Internal Medicine

## 2013-06-11 ENCOUNTER — Ambulatory Visit: Payer: Self-pay | Admitting: Internal Medicine

## 2013-06-11 ENCOUNTER — Encounter: Payer: Self-pay | Admitting: Internal Medicine

## 2013-06-11 VITALS — BP 142/80 | HR 58 | Temp 97.6°F | Resp 10

## 2013-06-11 DIAGNOSIS — M12519 Traumatic arthropathy, unspecified shoulder: Secondary | ICD-10-CM

## 2013-06-11 DIAGNOSIS — J069 Acute upper respiratory infection, unspecified: Secondary | ICD-10-CM

## 2013-06-11 DIAGNOSIS — M75101 Unspecified rotator cuff tear or rupture of right shoulder, not specified as traumatic: Secondary | ICD-10-CM

## 2013-06-11 DIAGNOSIS — M12811 Other specific arthropathies, not elsewhere classified, right shoulder: Secondary | ICD-10-CM

## 2013-06-11 MED ORDER — DOXYCYCLINE HYCLATE 100 MG PO TABS: 100.0000 mg | ORAL_TABLET | Freq: Two times a day (BID) | ORAL | Status: DC

## 2013-06-11 MED ORDER — HYDROCODONE-IBUPROFEN 7.5-200 MG PO TABS: 1.0000 | ORAL_TABLET | Freq: Four times a day (QID) | ORAL | Status: DC | PRN

## 2013-06-11 NOTE — Patient Instructions (Signed)
Warm humidity to help with congestion Nasal saline to rinse sinuses  Plain Mucinex to help with congestion also

## 2013-06-11 NOTE — Progress Notes (Signed)
Patient ID: [name] Camp., male   DOB: Feb 04, 1941, 73 y.o.   MRN: [id number]   Location:  [facility]   Allergies  Allergen Reactions  . Amitriptyline   . Azithromycin     Stomach pain  . Flexeril [Cyclobenzaprine]     Causes dizziness    Chief Complaint  Patient presents with  . Acute Visit    Patient c/o congestion and throat tightness. Patient denies fever   . FYI Only    Patient refused to weigh today     HPI: Patient is a 73 y.o. white male seen in the office today for acute visit. Cold and flu symptoms except fever.  Has so much congestion around his eyes and nose Was so tight in his neck and he couldn't expel the phlegm Improved since this morning Sneezing and coughing Eyes are blurry Cannot get nose clear to breathe through his nose Wanted to avoid working outdoors today Octa all over No nausea, vomiting, diarrhea. No sick contacts known  Treated for cat bite on left hand--completed two courses of abx but still red and sore. Review of Systems:  Review of Systems  Constitutional: Positive for malaise/fatigue. Negative for fever and chills.  HENT: Positive for congestion and hearing loss. Negative for ear discharge, ear pain and sore throat.   Respiratory: Negative for shortness of breath.   Cardiovascular: Negative for chest pain.  Musculoskeletal: Positive for joint pain.       Right shoulder  Skin: Negative for rash.  Neurological: Positive for headaches.    Past Medical History  Diagnosis Date  . HTN (hypertension)   . Partial deafness   . Clostridium difficile colitis   . GERD (gastroesophageal reflux disease)   . Allergy   . Hx of echocardiogram     a. Echo 11/12:  Mod LVH, EF 55-60%, MAC, mod LAE, mild RAE  . Depression   . Atrial flutter     a. dx after inguinal hernia repair in 10/12 => seen by Dr. Acie Fredrickson  . Arthritis     "right shoulder" (05/03/2012)  . Kidney stone     "they just passed"  (05/03/2012)  . Allergic rhinitis due to pollen   . Pain in joint, pelvic region and thigh   . Complications affecting other specified body systems, hypertension   . Other specified cardiac dysrhythmias(427.89)   . Rash and other nonspecific skin eruption   . Sacroiliitis, not elsewhere classified   . Obesity, unspecified   . Inguinal hernia without mention of obstruction or gangrene, unilateral or unspecified, (not specified as recurrent)   . Intestinal infection due to Clostridium difficile   . Irritable bowel syndrome   . Special screening for malignant neoplasm of prostate   . Unspecified hereditary and idiopathic peripheral neuropathy   . Sacroiliitis, not elsewhere classified   . Spasm of muscle   . Cervicalgia   . Degeneration of cervical intervertebral disc   . Pain in joint, ankle and foot   . Cervicalgia   . Pain in joint, shoulder region   . Nonspecific abnormal electrocardiogram (ECG) (EKG)   . Calculus of kidney   . Depressive disorder, not elsewhere classified   . Unspecified constipation   . Pain in joint, lower leg   . Anxiety state, unspecified   . Other malaise and fatigue   . Insomnia, unspecified   . Lumbago   . Coronary atherosclerosis of unspecified type of vessel, native or graft   .  Nonspecific abnormal results of pulmonary system function study   . Impotence of organic origin   . Osteoarthrosis, unspecified whether generalized or localized, unspecified site     Past Surgical History  Procedure Laterality Date  . Appendectomy  ~ 1956  . Tonsillectomy  ~ 1946  . Renal cyst excision      pt denies this hx on 05/03/2012  . Colonoscopy  06/06/2008    severe sigmoid diverticulosis and internal hemorrhoids  . Esophagogastroduodenoscopy  06/06/2008    normal  . Posterior lumbar fusion  1989  . Total hip arthroplasty  1999-2000    bilateral  . Carpal tunnel with cubital tunnel  2004    "right" (05/03/2012)  . Cardiac catheterization  08/1999; 05/03/2012     normal coronary arteries;   . Inguinal hernia repair  03/23/11    "left" (05/03/2012)  . Spine surgery  1969    Pantopaque Myelography and spinal infusion- Dr. Lyman Speller  . Reconstruction (r) foot surgery      DR SUE   . Right knee  1961 & 1963    Social History:   reports that he has quit smoking. His smoking use included Cigarettes. He has a 10 pack-year smoking history. He has never used smokeless tobacco. He reports that he does not drink alcohol or use illicit drugs.  Family History  Problem Relation Age of Onset  . Colon cancer Neg Hx   . Hypertension Father     Medications: Patient's Medications  New Prescriptions   No medications on file  Previous Medications   ASPIRIN EC 81 MG TABLET    Take 1 tablet (81 mg total) by mouth daily.   HYDROCHLOROTHIAZIDE (HYDRODIURIL) 25 MG TABLET    Take 12.5 mg by mouth 2 (two) times daily.   LOSARTAN (COZAAR) 50 MG TABLET    Take 1 tablet (50 mg total) by mouth daily.   NEXIUM 40 MG CAPSULE       OXYCODONE (ROXICODONE) 30 MG IMMEDIATE RELEASE TABLET    Take 1 tablet (30 mg total) by mouth every 4 (four) hours as needed for pain.   TRAZODONE (DESYREL) 50 MG TABLET       VITAMIN C (ASCORBIC ACID) 500 MG TABLET    Take 2,000 mg by mouth daily.   Modified Medications   No medications on file  Discontinued Medications   AMOXICILLIN-CLAVULANATE (AUGMENTIN) 875-125 MG PER TABLET    Take 1 tablet by mouth 2 (two) times daily.     Physical Exam: Filed Vitals:   06/11/13 1502  BP: 142/80  Pulse: 58  Temp: 97.6 F (36.4 C)  TempSrc: Oral  Resp: 10  SpO2: 98%  Physical Exam  Constitutional: He is oriented to person, place, and time. He appears well-developed and well-nourished. No distress.  HENT:  Head: Normocephalic and atraumatic.  Right Ear: External ear normal.  Left Ear: External ear normal.  Mouth/Throat: Oropharyngeal exudate present.  Postnasal drip  Eyes: Conjunctivae and EOM are normal. Pupils are equal, round, and reactive  to light.  Neck: Normal range of motion. Neck supple.  Cardiovascular: Normal rate, regular rhythm, normal heart sounds and intact distal pulses.   Pulmonary/Chest: Effort normal and breath sounds normal. No respiratory distress.  Abdominal: Soft. Bowel sounds are normal. He exhibits no distension. There is no tenderness.  Musculoskeletal: He exhibits tenderness.  Right shoulder  Lymphadenopathy:    He has no cervical adenopathy.  Neurological: He is alert and oriented to person, place, and time.  Skin:  Left hand just distal to wrist with erythematous place present, but no warmth, drainage or swelling  Psychiatric: He has a normal mood and affect.     Lab Results  Component Value Date   HGBA1C  Value: 5.8 (NOTE)                                                                       According to the ADA Clinical Practice Recommendations for 2011, when HbA1c is used as a screening test:   >=6.5%   Diagnostic of Diabetes Mellitus           (if abnormal result  is confirmed)  5.7-6.4%   Increased risk of developing Diabetes Mellitus  References:Diagnosis and Classification of Diabetes Mellitus,Diabetes JDYN,1833,58(IPPGF 1):S62-S69 and Standards of Medical Care in         Diabetes - 2011,Diabetes Care,2011,34  (Suppl 1):S11-S61.* 08/10/2010   Assessment/Plan 1. URI, acute -has not responded to augmentin therapy he used for his cat bite -encouraged warm humidity, given nasal saline rinse kit and advised adequate fluids and rest off work for a few days  - doxycycline (VIBRA-TABS) 100 MG tablet; Take 1 tablet (100 mg total) by mouth 2 (two) times daily.  Dispense: 20 tablet; Refill: 0  2. Rotator cuff tear arthropathy of right shoulder - says the oxycodone was not helping him at all and wants to go back to vicoprofen which at least had some effect on his pain - HYDROcodone-ibuprofen (VICOPROFEN) 7.5-200 MG per tablet; Take 1 tablet by mouth 4 (four) times daily as needed for moderate pain.   Dispense: 120 tablet; Refill: 0  Next appt:  Keep as scheduled with Dr. Nyoka Cowden

## 2013-07-12 ENCOUNTER — Other Ambulatory Visit: Payer: Self-pay | Admitting: *Deleted

## 2013-07-12 DIAGNOSIS — M75101 Unspecified rotator cuff tear or rupture of right shoulder, not specified as traumatic: Principal | ICD-10-CM

## 2013-07-12 DIAGNOSIS — M12811 Other specific arthropathies, not elsewhere classified, right shoulder: Secondary | ICD-10-CM

## 2013-07-12 MED ORDER — HYDROCODONE-IBUPROFEN 7.5-200 MG PO TABS: 1.0000 | ORAL_TABLET | Freq: Four times a day (QID) | ORAL | Status: DC | PRN

## 2013-07-25 ENCOUNTER — Ambulatory Visit (INDEPENDENT_AMBULATORY_CARE_PROVIDER_SITE_OTHER): Payer: Medicare Other | Admitting: Internal Medicine

## 2013-07-25 ENCOUNTER — Encounter: Payer: Self-pay | Admitting: Internal Medicine

## 2013-07-25 VITALS — BP 168/86 | HR 52 | Temp 98.1°F | Resp 18

## 2013-07-25 DIAGNOSIS — I10 Essential (primary) hypertension: Secondary | ICD-10-CM

## 2013-07-25 DIAGNOSIS — K5289 Other specified noninfective gastroenteritis and colitis: Secondary | ICD-10-CM

## 2013-07-25 DIAGNOSIS — M25519 Pain in unspecified shoulder: Secondary | ICD-10-CM

## 2013-07-25 DIAGNOSIS — K529 Noninfective gastroenteritis and colitis, unspecified: Secondary | ICD-10-CM

## 2013-07-25 DIAGNOSIS — R112 Nausea with vomiting, unspecified: Secondary | ICD-10-CM | POA: Insufficient documentation

## 2013-07-25 MED ORDER — ONDANSETRON HCL 4 MG PO TABS: ORAL_TABLET | ORAL | Status: DC

## 2013-07-25 NOTE — Patient Instructions (Signed)
Use medications as listed 

## 2013-07-26 LAB — CBC WITH DIFFERENTIAL
BASOS ABS: 0 10*3/uL (ref 0.0–0.2)
Basos: 1 %
Eos: 2 %
Eosinophils Absolute: 0.1 10*3/uL (ref 0.0–0.4)
HCT: 44.7 % (ref 37.5–51.0)
Hemoglobin: 15.5 g/dL (ref 12.6–17.7)
IMMATURE GRANULOCYTES: 0 %
Immature Grans (Abs): 0 10*3/uL (ref 0.0–0.1)
LYMPHS ABS: 1.4 10*3/uL (ref 0.7–3.1)
LYMPHS: 26 %
MCH: 31.8 pg (ref 26.6–33.0)
MCHC: 34.7 g/dL (ref 31.5–35.7)
MCV: 92 fL (ref 79–97)
MONOCYTES: 11 %
Monocytes Absolute: 0.6 10*3/uL (ref 0.1–0.9)
NEUTROS PCT: 60 %
Neutrophils Absolute: 3.2 10*3/uL (ref 1.4–7.0)
PLATELETS: 180 10*3/uL (ref 150–379)
RBC: 4.88 x10E6/uL (ref 4.14–5.80)
RDW: 13.3 % (ref 12.3–15.4)
WBC: 5.3 10*3/uL (ref 3.4–10.8)

## 2013-07-26 LAB — COMPREHENSIVE METABOLIC PANEL
ALBUMIN: 4.6 g/dL (ref 3.5–4.8)
ALK PHOS: 76 IU/L (ref 39–117)
ALT: 17 IU/L (ref 0–44)
AST: 20 IU/L (ref 0–40)
Albumin/Globulin Ratio: 2.4 (ref 1.1–2.5)
BUN / CREAT RATIO: 18 (ref 10–22)
BUN: 22 mg/dL (ref 8–27)
CHLORIDE: 102 mmol/L (ref 97–108)
CO2: 26 mmol/L (ref 18–29)
Calcium: 10.2 mg/dL (ref 8.6–10.2)
Creatinine, Ser: 1.24 mg/dL (ref 0.76–1.27)
GFR calc Af Amer: 66 mL/min/{1.73_m2} (ref >59)
GFR calc non Af Amer: 57 mL/min/{1.73_m2} — ABNORMAL LOW (ref >59)
Globulin, Total: 1.9 g/dL (ref 1.5–4.5)
Glucose: 104 mg/dL — ABNORMAL HIGH (ref 65–99)
Potassium: 5.8 mmol/L — ABNORMAL HIGH (ref 3.5–5.2)
SODIUM: 144 mmol/L (ref 134–144)
Total Bilirubin: 1.4 mg/dL — ABNORMAL HIGH (ref 0.0–1.2)
Total Protein: 6.5 g/dL (ref 6.0–8.5)

## 2013-08-09 DIAGNOSIS — K529 Noninfective gastroenteritis and colitis, unspecified: Secondary | ICD-10-CM | POA: Insufficient documentation

## 2013-08-09 NOTE — Progress Notes (Signed)
Patient ID: [name] [name]., male   DOB: Jan 27, 1941, 73 y.o.   MRN: [id number]    Location:    [facility]  Place of Service:  OFFICE   Allergies  Allergen Reactions  . Amitriptyline   . Azithromycin     Stomach pain  . Flexeril [Cyclobenzaprine]     Causes dizziness    Chief Complaint  Patient presents with  . Follow-up    HPI:  Nausea with vomiting - acute problem related to acute gastroenteritis. Wife sick at home with the same symptoms. No fever. No blood in stool.  HTN (hypertension): elevated today. Home pressures were better until the acute illness with N&V  Acute gastroenteritis: sick for about 3 days. Less vomiting now as compared to at onset.   Pain in joint, shoulder region: persistent. Has seen several orthopedists. Nobody wants to do surgery.    Medications: Patient's Medications  New Prescriptions   ONDANSETRON (ZOFRAN) 4 MG TABLET    One every 8 hous if needed for nausea.  Previous Medications   ASPIRIN EC 81 MG TABLET    Take 1 tablet (81 mg total) by mouth daily.   DOXYCYCLINE (VIBRA-TABS) 100 MG TABLET    Take 1 tablet (100 mg total) by mouth 2 (two) times daily.   HYDROCHLOROTHIAZIDE (HYDRODIURIL) 25 MG TABLET    Take 12.5 mg by mouth 2 (two) times daily.   HYDROCODONE-IBUPROFEN (VICOPROFEN) 7.5-200 MG PER TABLET    Take 1 tablet by mouth 4 (four) times daily as needed for moderate pain.   LOSARTAN (COZAAR) 50 MG TABLET    Take 1 tablet (50 mg total) by mouth daily.   NEXIUM 40 MG CAPSULE       TRAZODONE (DESYREL) 50 MG TABLET       VITAMIN C (ASCORBIC ACID) 500 MG TABLET    Take 2,000 mg by mouth daily.   Modified Medications   No medications on file  Discontinued Medications   No medications on file     Review of Systems  Constitutional: Positive for fatigue. Negative for fever and chills.  HENT: Positive for hearing loss. Negative for ear pain.   Eyes: Negative.   Respiratory: Negative.   Cardiovascular: Negative.        Patient has  hypertension.   Gastrointestinal: Positive for nausea and vomiting. Negative for diarrhea, constipation, blood in stool and abdominal distention.  Endocrine: Negative.   Musculoskeletal:       Shoulder pains, worse on the right.  Neurological: Negative.   Hematological: Negative.   Psychiatric/Behavioral: Negative.     Filed Vitals:   07/25/13 1524 07/25/13 1610  BP: 172/100 168/86  Pulse: 52   Temp: 98.1 F (36.7 C)   TempSrc: Oral   Resp: 18   SpO2: 99%    Physical Exam  Constitutional: He is oriented to person, place, and time. He appears well-developed and well-nourished. No distress.  Moderately overweight  HENT:  Head: Normocephalic and atraumatic.  Significant partial deafness bilaterally.  Eyes: Conjunctivae and EOM are normal. Pupils are equal, round, and reactive to light.  Neck: Normal range of motion. Neck supple. No JVD present. No tracheal deviation present. No thyromegaly present.  Cardiovascular: Normal rate, regular rhythm, normal heart sounds and intact distal pulses.  Exam reveals no gallop and no friction rub.   No murmur heard. Pulmonary/Chest: Effort normal and breath sounds normal. No respiratory distress. He has no wheezes. He has no rales. He exhibits no tenderness.  Abdominal: Soft. Bowel sounds  are normal. He exhibits no distension and no mass. There is no tenderness.  Musculoskeletal: He exhibits no edema and no tenderness.  Shoulder pain at rest and with movement Raising the right arm or rotational movement through the shoulder are very painful.  Lymphadenopathy:    He has no cervical adenopathy.  Neurological: He is alert and oriented to person, place, and time. No cranial nerve deficit.  Skin: Skin is dry. No rash noted. No erythema. There is pallor.  Psychiatric: He has a normal mood and affect. His behavior is normal. Judgment and thought content normal.     Labs reviewed: Office Visit on 07/25/2013  Component Date Value Ref Range Status    . WBC 07/25/2013 5.3  3.4 - 10.8 x10E3/uL Final  . RBC 07/25/2013 4.88  4.14 - 5.80 x10E6/uL Final  . Hemoglobin 07/25/2013 15.5  12.6 - 17.7 g/dL Final  . HCT 07/25/2013 44.7  37.5 - 51.0 % Final  . MCV 07/25/2013 92  79 - 97 fL Final  . MCH 07/25/2013 31.8  26.6 - 33.0 pg Final  . MCHC 07/25/2013 34.7  31.5 - 35.7 g/dL Final  . RDW 07/25/2013 13.3  12.3 - 15.4 % Final  . Platelets 07/25/2013 180  150 - 379 x10E3/uL Final  . Neutrophils Relative % 07/25/2013 60   Final  . Lymphs 07/25/2013 26   Final  . Monocytes 07/25/2013 11   Final  . Eos 07/25/2013 2   Final  . Basos 07/25/2013 1   Final  . Neutrophils Absolute 07/25/2013 3.2  1.4 - 7.0 x10E3/uL Final  . Lymphocytes Absolute 07/25/2013 1.4  0.7 - 3.1 x10E3/uL Final  . Monocytes Absolute 07/25/2013 0.6  0.1 - 0.9 x10E3/uL Final  . Eosinophils Absolute 07/25/2013 0.1  0.0 - 0.4 x10E3/uL Final  . Basophils Absolute 07/25/2013 0.0  0.0 - 0.2 x10E3/uL Final  . Immature Granulocytes 07/25/2013 0   Final  . Immature Grans (Abs) 07/25/2013 0.0  0.0 - 0.1 x10E3/uL Final  . Glucose 07/25/2013 104* 65 - 99 mg/dL Final  . BUN 07/25/2013 22  8 - 27 mg/dL Final  . Creatinine, Ser 07/25/2013 1.24  0.76 - 1.27 mg/dL Final  . GFR calc non Af Amer 07/25/2013 57* >59 mL/min/1.73 Final  . GFR calc Af Amer 07/25/2013 66  >59 mL/min/1.73 Final  . BUN/Creatinine Ratio 07/25/2013 18  10 - 22 Final  . Sodium 07/25/2013 144  134 - 144 mmol/L Final  . Potassium 07/25/2013 5.8* 3.5 - 5.2 mmol/L Final  . Chloride 07/25/2013 102  97 - 108 mmol/L Final  . CO2 07/25/2013 26  18 - 29 mmol/L Final  . Calcium 07/25/2013 10.2  8.6 - 10.2 mg/dL Final  . Total Protein 07/25/2013 6.5  6.0 - 8.5 g/dL Final  . Albumin 07/25/2013 4.6  3.5 - 4.8 g/dL Final  . Globulin, Total 07/25/2013 1.9  1.5 - 4.5 g/dL Final  . Albumin/Globulin Ratio 07/25/2013 2.4  1.1 - 2.5 Final  . Total Bilirubin 07/25/2013 1.4* 0.0 - 1.2 mg/dL Final  . Alkaline Phosphatase 07/25/2013 76  39  - 117 IU/L Final  . AST 07/25/2013 20  0 - 40 IU/L Final  . ALT 07/25/2013 17  0 - 44 IU/L Final      Assessment/Plan  1. Nausea with vomiting reassured - CBC With differential/Platelet; Future - Comprehensive metabolic panel; Future - ondansetron (ZOFRAN) 4 MG tablet; One every 8 hous if needed for nausea.  Dispense: 24 tablet; Refill: 0 - CBC  With differential/Platelet - Comprehensive metabolic panel  2. HTN (hypertension) Continue current med  3. Acute gastroenteritis Should end soon  4. Pain in joint, shoulder region Continue current med

## 2013-10-01 ENCOUNTER — Ambulatory Visit (INDEPENDENT_AMBULATORY_CARE_PROVIDER_SITE_OTHER): Payer: Medicare Other | Admitting: Internal Medicine

## 2013-10-01 ENCOUNTER — Encounter: Payer: Self-pay | Admitting: Internal Medicine

## 2013-10-01 VITALS — BP 138/76 | HR 60 | Temp 98.0°F | Wt 238.6 lb

## 2013-10-01 DIAGNOSIS — I10 Essential (primary) hypertension: Secondary | ICD-10-CM

## 2013-10-01 DIAGNOSIS — K529 Noninfective gastroenteritis and colitis, unspecified: Secondary | ICD-10-CM

## 2013-10-01 DIAGNOSIS — M12811 Other specific arthropathies, not elsewhere classified, right shoulder: Secondary | ICD-10-CM

## 2013-10-01 DIAGNOSIS — M19019 Primary osteoarthritis, unspecified shoulder: Secondary | ICD-10-CM

## 2013-10-01 DIAGNOSIS — K5289 Other specified noninfective gastroenteritis and colitis: Secondary | ICD-10-CM

## 2013-10-01 DIAGNOSIS — J309 Allergic rhinitis, unspecified: Secondary | ICD-10-CM

## 2013-10-01 DIAGNOSIS — M75101 Unspecified rotator cuff tear or rupture of right shoulder, not specified as traumatic: Secondary | ICD-10-CM

## 2013-10-01 MED ORDER — HYDROCODONE-IBUPROFEN 7.5-200 MG PO TABS: 2.0000 | ORAL_TABLET | Freq: Every evening | ORAL | Status: DC | PRN

## 2013-10-01 NOTE — Progress Notes (Signed)
Patient ID: [name] [name]., male   DOB: 1941/05/15, 73 y.o.   MRN: [id number]   Location:  [facility]   Allergies  Allergen Reactions  . Amitriptyline   . Azithromycin     Stomach pain  . Flexeril [Cyclobenzaprine]     Causes dizziness    Chief Complaint  Patient presents with  . Acute Visit    heavy chest congestion, cough and diarrhea x 6-7 days, symptoms have gotten better  . other    wants to change BP meds due to not controling it     HPI: Patient is a 73 y.o. white male seen in the office today for acute visit due to "gi bug."  Also notes his bp has been poorly controlled.    1.5 wks ago was having GI bug--everything went straight through him for a whole week--even water went straight through.  Sun am, it finally stayed down through the afternoon.  Then drank two ensures.  Last night, drank a lot of water and it stayed down.  Gatorade stayed down. No longer nauseous or having pain around his midabdomen.  Also was having a lot of congestion and sore throat.  Was taking mucinex for 5 days and that cleared up.  Allergy season does typically do a number on him.  Was really weak, staggering as walking.   Now he's fine.    Is really trying to drink enough water now.    Marisa Severin was too expensive so was switched and bp higher since.  On losartan and hctz.  No longer on cold medicine.  Was 190/85 last time he took it--was stressed at that time.  Discussed he will recheck his bp more regularly outside of here.  Today's is fine.    Takes his hydrocodone/ibuprofen for his rotator cuff--for uncontrolled pain.  Has been told it cannot be repaired.  Needs it refilled.    Review of Systems:  Review of Systems  Constitutional: Negative for fever, chills and malaise/fatigue.  HENT: Negative for congestion.   Eyes: Negative for blurred vision.  Respiratory: Negative for shortness of breath.   Cardiovascular: Negative for chest pain.    Gastrointestinal: Negative for abdominal pain, diarrhea and constipation.  Genitourinary: Negative for dysuria, urgency and frequency.  Musculoskeletal: Positive for joint pain.  Skin: Negative for rash.  Neurological: Negative for dizziness and weakness.  Endo/Heme/Allergies: Does not bruise/bleed easily.    Past Medical History  Diagnosis Date  . HTN (hypertension)   . Partial deafness   . Clostridium difficile colitis   . GERD (gastroesophageal reflux disease)   . Allergy   . Hx of echocardiogram     a. Echo 11/12:  Mod LVH, EF 55-60%, MAC, mod LAE, mild RAE  . Depression   . Atrial flutter     a. dx after inguinal hernia repair in 10/12 => seen by Dr. Acie Fredrickson  . Arthritis     "right shoulder" (05/03/2012)  . Kidney stone     "they just passed" (05/03/2012)  . Allergic rhinitis due to pollen   . Pain in joint, pelvic region and thigh   . Complications affecting other specified body systems, hypertension   . Other specified cardiac dysrhythmias(427.89)   . Rash and other nonspecific skin eruption   . Sacroiliitis, not elsewhere classified   . Obesity, unspecified   . Inguinal hernia without mention of obstruction or gangrene, unilateral or unspecified, (not specified as recurrent)   . Intestinal infection  due to Clostridium difficile   . Irritable bowel syndrome   . Special screening for malignant neoplasm of prostate   . Unspecified hereditary and idiopathic peripheral neuropathy   . Sacroiliitis, not elsewhere classified   . Spasm of muscle   . Cervicalgia   . Degeneration of cervical intervertebral disc   . Pain in joint, ankle and foot   . Cervicalgia   . Pain in joint, shoulder region   . Nonspecific abnormal electrocardiogram (ECG) (EKG)   . Calculus of kidney   . Depressive disorder, not elsewhere classified   . Unspecified constipation   . Pain in joint, lower leg   . Anxiety state, unspecified   . Other malaise and fatigue   . Insomnia, unspecified   .  Lumbago   . Coronary atherosclerosis of unspecified type of vessel, native or graft   . Nonspecific abnormal results of pulmonary system function study   . Impotence of organic origin   . Osteoarthrosis, unspecified whether generalized or localized, unspecified site     Past Surgical History  Procedure Laterality Date  . Appendectomy  ~ 1956  . Tonsillectomy  ~ 1946  . Renal cyst excision      pt denies this hx on 05/03/2012  . Colonoscopy  06/06/2008    severe sigmoid diverticulosis and internal hemorrhoids  . Esophagogastroduodenoscopy  06/06/2008    normal  . Posterior lumbar fusion  1989  . Total hip arthroplasty  1999-2000    bilateral  . Carpal tunnel with cubital tunnel  2004    "right" (05/03/2012)  . Cardiac catheterization  08/1999; 05/03/2012    normal coronary arteries;   . Inguinal hernia repair  03/23/11    "left" (05/03/2012)  . Spine surgery  1969    Pantopaque Myelography and spinal infusion- Dr. Lyman Speller  . Reconstruction (r) foot surgery      DR SUE   . Right knee  1961 & 1963    Social History:   reports that he has quit smoking. His smoking use included Cigarettes. He has a 10 pack-year smoking history. He has never used smokeless tobacco. He reports that he does not drink alcohol or use illicit drugs.  Family History  Problem Relation Age of Onset  . Colon cancer Neg Hx   . Hypertension Father     Medications: Patient's Medications  New Prescriptions   No medications on file  Previous Medications   ASPIRIN EC 81 MG TABLET    Take 1 tablet (81 mg total) by mouth daily.   DOXYCYCLINE (VIBRA-TABS) 100 MG TABLET    Take 1 tablet (100 mg total) by mouth 2 (two) times daily.   HYDROCHLOROTHIAZIDE (HYDRODIURIL) 25 MG TABLET    Take 12.5 mg by mouth 2 (two) times daily.   HYDROCODONE-IBUPROFEN (VICOPROFEN) 7.5-200 MG PER TABLET    Take 1 tablet by mouth 4 (four) times daily as needed for moderate pain.   LOSARTAN (COZAAR) 50 MG TABLET    Take 1 tablet (50 mg total)  by mouth daily.   NEXIUM 40 MG CAPSULE       ONDANSETRON (ZOFRAN) 4 MG TABLET    One every 8 hous if needed for nausea.   TRAZODONE (DESYREL) 50 MG TABLET       VITAMIN C (ASCORBIC ACID) 500 MG TABLET    Take 2,000 mg by mouth daily.   Modified Medications   No medications on file  Discontinued Medications   No medications on file  Physical Exam: Filed Vitals:   10/01/13 1453  BP: 138/76  Pulse: 60  Temp: 98 F (36.7 C)  TempSrc: Oral  Weight: 238 lb 9.6 oz (108.228 kg)  SpO2: 99%  Physical Exam  Constitutional: He is oriented to person, place, and time. He appears well-developed and well-nourished.  Cardiovascular: Normal rate, regular rhythm and intact distal pulses.   Pulmonary/Chest: Effort normal and breath sounds normal.  Abdominal: Soft. Bowel sounds are normal. He exhibits no distension and no mass. There is no tenderness.  Musculoskeletal: Normal range of motion.  Neurological: He is alert and oriented to person, place, and time.  Skin: Skin is warm and dry.  Psychiatric: He has a normal mood and affect.   Labs reviewed: Basic Metabolic Panel:  Recent Labs  07/25/13 1613  NA 144  K 5.8*  CL 102  CO2 26  GLUCOSE 104*  BUN 22  CREATININE 1.24  CALCIUM 10.2   Liver Function Tests:  Recent Labs  07/25/13 1613  AST 20  ALT 17  ALKPHOS 76  BILITOT 1.4*  PROT 6.5  CBC:  Recent Labs  07/25/13 1613  WBC 5.3  NEUTROABS 3.2  HGB 15.5  HCT 44.7  MCV 92  PLT 180   Lab Results  Component Value Date   HGBA1C  Value: 5.8 (NOTE)                                                                       According to the ADA Clinical Practice Recommendations for 2011, when HbA1c is used as a screening test:   >=6.5%   Diagnostic of Diabetes Mellitus           (if abnormal result  is confirmed)  5.7-6.4%   Increased risk of developing Diabetes Mellitus  References:Diagnosis and Classification of Diabetes Mellitus,Diabetes OVZC,5885,02(DXAJO 1):S62-S69 and  Standards of Medical Care in         Diabetes - 2011,Diabetes INOM,7672,09  (Suppl 1):S11-S61.* 08/10/2010   Assessment/Plan 1. Rotator cuff tear arthropathy of right shoulder -stable, but inoperable--bothers him at night only and uses 1-2 hydrocodone at bedtime sometimes--tries not to take - HYDROcodone-ibuprofen (VICOPROFEN) 7.5-200 MG per tablet; Take 2 tablets by mouth at bedtime as needed for moderate pain. In shoulder  Dispense: 60 tablet; Refill: 0  2. Acute gastroenteritis - has now resolved since yesterday -encouraged fluids--seems better  3. Allergic rhinitis -always bothers him around this time of year -also improved at this point  4. Essential hypertension, benign -bp at goal here today--says he had one episode where very high, but he was highly stressed at that time -if bp remains up, I would add amlodipine to his regimen   Labs/tests ordered:  none Next appt:  Keep with Dr. Nyoka Cowden later this month

## 2013-10-01 NOTE — Patient Instructions (Signed)
Let us know if your blood pressure readings at home are running over 150/90.    Drink plenty of fluids.

## 2013-10-10 ENCOUNTER — Other Ambulatory Visit: Payer: Self-pay | Admitting: Internal Medicine

## 2013-10-10 DIAGNOSIS — R197 Diarrhea, unspecified: Secondary | ICD-10-CM

## 2013-10-10 MED ORDER — METRONIDAZOLE 500 MG PO TABS: ORAL_TABLET | ORAL | Status: DC

## 2013-10-11 ENCOUNTER — Other Ambulatory Visit: Payer: Medicare Other

## 2013-10-11 ENCOUNTER — Telehealth: Payer: Self-pay

## 2013-10-11 DIAGNOSIS — R197 Diarrhea, unspecified: Secondary | ICD-10-CM

## 2013-10-11 NOTE — Telephone Encounter (Signed)
Patient filled rx for metronidazole 500 mg, patient not sure if Dr.Green told him to start medication now or to wait until stool results to start. Patient was here with his wife Laural Roes had an appointment on 10/10/13). Dr.Green please advise

## 2013-10-11 NOTE — Telephone Encounter (Signed)
He does not need to wait until the results return, he just needs to turn in the stool sample and then he can start the medication.

## 2013-10-11 NOTE — Telephone Encounter (Signed)
Patient informed of Dr.Green's response

## 2013-10-12 LAB — CLOSTRIDIUM DIFFICILE BY PCR: CDIFFPCR: POSITIVE — AB

## 2013-10-18 ENCOUNTER — Other Ambulatory Visit: Payer: Self-pay | Admitting: *Deleted

## 2013-10-18 ENCOUNTER — Other Ambulatory Visit: Payer: Medicare Other

## 2013-10-18 DIAGNOSIS — R112 Nausea with vomiting, unspecified: Secondary | ICD-10-CM

## 2013-10-18 DIAGNOSIS — I10 Essential (primary) hypertension: Secondary | ICD-10-CM

## 2013-10-19 LAB — COMPREHENSIVE METABOLIC PANEL
ALK PHOS: 69 IU/L (ref 39–117)
ALT: 35 IU/L (ref 0–44)
AST: 44 IU/L — AB (ref 0–40)
Albumin/Globulin Ratio: 2.6 — ABNORMAL HIGH (ref 1.1–2.5)
Albumin: 4.2 g/dL (ref 3.5–4.8)
BUN/Creatinine Ratio: 15 (ref 10–22)
BUN: 18 mg/dL (ref 8–27)
CO2: 27 mmol/L (ref 18–29)
Calcium: 9.6 mg/dL (ref 8.6–10.2)
Chloride: 103 mmol/L (ref 97–108)
Creatinine, Ser: 1.23 mg/dL (ref 0.76–1.27)
GFR calc non Af Amer: 58 mL/min/{1.73_m2} — ABNORMAL LOW (ref >59)
GFR, EST AFRICAN AMERICAN: 67 mL/min/{1.73_m2} (ref >59)
Globulin, Total: 1.6 g/dL (ref 1.5–4.5)
Glucose: 94 mg/dL (ref 65–99)
Potassium: 4.9 mmol/L (ref 3.5–5.2)
SODIUM: 142 mmol/L (ref 134–144)
Total Bilirubin: 0.7 mg/dL (ref 0.0–1.2)
Total Protein: 5.8 g/dL — ABNORMAL LOW (ref 6.0–8.5)

## 2013-10-19 LAB — CBC WITH DIFFERENTIAL/PLATELET
BASOS ABS: 0.1 10*3/uL (ref 0.0–0.2)
Basos: 1 %
Eos: 3 %
Eosinophils Absolute: 0.1 10*3/uL (ref 0.0–0.4)
HEMATOCRIT: 38.3 % (ref 37.5–51.0)
HEMOGLOBIN: 13.3 g/dL (ref 12.6–17.7)
IMMATURE GRANULOCYTES: 0 %
Immature Grans (Abs): 0 10*3/uL (ref 0.0–0.1)
LYMPHS ABS: 1 10*3/uL (ref 0.7–3.1)
Lymphs: 25 %
MCH: 31.5 pg (ref 26.6–33.0)
MCHC: 34.7 g/dL (ref 31.5–35.7)
MCV: 91 fL (ref 79–97)
MONOS ABS: 0.4 10*3/uL (ref 0.1–0.9)
Monocytes: 11 %
NEUTROS ABS: 2.4 10*3/uL (ref 1.4–7.0)
Neutrophils Relative %: 60 %
RBC: 4.22 x10E6/uL (ref 4.14–5.80)
RDW: 14.1 % (ref 12.3–15.4)
WBC: 4.1 10*3/uL (ref 3.4–10.8)

## 2013-10-24 ENCOUNTER — Encounter: Payer: Self-pay | Admitting: Internal Medicine

## 2013-10-24 ENCOUNTER — Ambulatory Visit (INDEPENDENT_AMBULATORY_CARE_PROVIDER_SITE_OTHER): Payer: Medicare Other | Admitting: Internal Medicine

## 2013-10-24 VITALS — BP 170/90 | HR 62 | Temp 98.0°F | Resp 20 | Ht 75.0 in

## 2013-10-24 DIAGNOSIS — M19019 Primary osteoarthritis, unspecified shoulder: Secondary | ICD-10-CM

## 2013-10-24 DIAGNOSIS — I4892 Unspecified atrial flutter: Secondary | ICD-10-CM

## 2013-10-24 DIAGNOSIS — M12811 Other specific arthropathies, not elsewhere classified, right shoulder: Secondary | ICD-10-CM

## 2013-10-24 DIAGNOSIS — M75101 Unspecified rotator cuff tear or rupture of right shoulder, not specified as traumatic: Secondary | ICD-10-CM

## 2013-10-24 DIAGNOSIS — A0472 Enterocolitis due to Clostridium difficile, not specified as recurrent: Secondary | ICD-10-CM | POA: Insufficient documentation

## 2013-10-24 DIAGNOSIS — N189 Chronic kidney disease, unspecified: Secondary | ICD-10-CM

## 2013-10-24 DIAGNOSIS — H9190 Unspecified hearing loss, unspecified ear: Secondary | ICD-10-CM

## 2013-10-24 DIAGNOSIS — K5909 Other constipation: Secondary | ICD-10-CM

## 2013-10-24 DIAGNOSIS — I10 Essential (primary) hypertension: Secondary | ICD-10-CM

## 2013-10-24 DIAGNOSIS — K5900 Constipation, unspecified: Secondary | ICD-10-CM

## 2013-10-24 DIAGNOSIS — E669 Obesity, unspecified: Secondary | ICD-10-CM

## 2013-10-24 NOTE — Patient Instructions (Signed)
Continue medications as listed. Hydrocodone constipates.

## 2013-10-24 NOTE — Progress Notes (Signed)
Patient ID: [name] Camp., male   DOB: 21-May-1941, 73 y.o.   MRN: [id number]    Location:    [facility]  Place of Service:  OFFICE  PCP: Estill Dooms, MD  Code Status: LIVING WILL  Extended Emergency Contact Information Primary Emergency Contact: Kisiel,Brenda Address: [address], [place] [address] [place] Phone: [phone] Mobile Phone: [phone] Relation: Spouse  Allergies  Allergen Reactions  . Amitriptyline   . Azithromycin     Stomach pain  . Flexeril [Cyclobenzaprine]     Causes dizziness    Chief Complaint  Patient presents with  . Annual Exam    HPI:  Patient presents today for a complete exam and review of chronic medical issues.   He had a recent problem with relapse of ulcerative difficile. He was treated with metronidazole and appears to be fully recovered from that illness. In fact, he is constipated at this time.  Chronic constipation: Using milk of magnesia as needed  Atrial flutter: Currently in normal sinus rhythm  Rotator cuff tear arthropathy of right shoulder: Continues to have significant pains in the shoulders which limits his ability to lift or do exercises to increase upper body strength.  HTN (hypertension): Continues to have elevations of systolic blood pressure. He would like to go back on Tekturna.  CKD (chronic kidney disease): Recent lab shows normal BUN and creatinine  Obesity, unspecified: Continues to cycle up and down with his weight. His problems with support of his wife and her emotional state created difficulties for the patient in regards to his own weight control and exercise habits.  Partial deafness: Significant and may be worsening. Interferes with communication.      Past Medical History  Diagnosis Date  . HTN (hypertension)   . Partial deafness   . Clostridium difficile colitis   . GERD (gastroesophageal reflux disease)   . Allergy   . Hx of echocardiogram     a.  Echo 11/12:  Mod LVH, EF 55-60%, MAC, mod LAE, mild RAE  . Depression   . Atrial flutter     a. dx after inguinal hernia repair in 10/12 => seen by Dr. Acie Fredrickson  . Arthritis     "right shoulder" (05/03/2012)  . Kidney stone     "they just passed" (05/03/2012)  . Allergic rhinitis due to pollen   . Pain in joint, pelvic region and thigh   . Complications affecting other specified body systems, hypertension   . Other specified cardiac dysrhythmias(427.89)   . Rash and other nonspecific skin eruption   . Sacroiliitis, not elsewhere classified   . Obesity, unspecified   . Inguinal hernia without mention of obstruction or gangrene, unilateral or unspecified, (not specified as recurrent)   . Intestinal infection due to Clostridium difficile   . Irritable bowel syndrome   . Special screening for malignant neoplasm of prostate   . Unspecified hereditary and idiopathic peripheral neuropathy   . Sacroiliitis, not elsewhere classified   . Spasm of muscle   . Cervicalgia   . Degeneration of cervical intervertebral disc   . Pain in joint, ankle and foot   . Cervicalgia   . Pain in joint, shoulder region   . Nonspecific abnormal electrocardiogram (ECG) (EKG)   . Calculus of kidney   . Depressive disorder, not elsewhere classified   . Unspecified constipation   . Pain in joint, lower leg   .  Anxiety state, unspecified   . Other malaise and fatigue   . Insomnia, unspecified   . Lumbago   . Coronary atherosclerosis of unspecified type of vessel, native or graft   . Nonspecific abnormal results of pulmonary system function study   . Impotence of organic origin   . Osteoarthrosis, unspecified whether generalized or localized, unspecified site     Past Surgical History  Procedure Laterality Date  . Appendectomy  ~ 1956  . Tonsillectomy  ~ 1946  . Renal cyst excision      pt denies this hx on 05/03/2012  . Colonoscopy  06/06/2008    severe sigmoid diverticulosis and internal hemorrhoids  .  Esophagogastroduodenoscopy  06/06/2008    normal  . Posterior lumbar fusion  1989  . Total hip arthroplasty  1999-2000    bilateral  . Carpal tunnel with cubital tunnel  2004    "right" (05/03/2012)  . Cardiac catheterization  08/1999; 05/03/2012    normal coronary arteries;   . Inguinal hernia repair  03/23/11    "left" (05/03/2012)  . Spine surgery  1969    Pantopaque Myelography and spinal infusion- Dr. Lyman Speller  . Reconstruction (r) foot surgery      DR SUE   . Right knee  1961 & 1963    CONSULTANT[facility] Dr. Quentin Cornwall- Neurosurgeon  Dr. Collie Siad- Orthopedic  Dr. Amalia Hailey -Urology  Dr.Orr-GI Dr. Johnnye Sima  Dr. Eulas Post- Orthopedic  Gwyneth Sprout -([facility])  [place]- OCG/UGI normal  [place] Diverticulosis  02/1989- Abdomen Ultrasound Right Lower pole cyst  05/1996- IVP Right Ureterolithiasis  1998 -Colonoscopy Dr. Lajoyce Corners; one colon polyp 10/08/2004- CT of the Head: Mild Ventricular dilation  07/20/2004- CT of the Abdomen: Diverticulitis, Renal cyst and Calculi, Mild right lower lobe atelectasis 07/20/2004- Ct of the Pelvis: Sigmoid diverticulosis  05/17/2008- X-Ray of the Abdomen  06/06/2008 Endoscopy Un remarkable  Dr Jim Desanctis  07/25/2008 Abdomen  Nonspecific air fluid levels in nondisplaced bowel as directed  11/04/2008 X-Ray Left Foot Soft tissue swelling and degenerative changes without acute fracture 05/09/2009 CT of the Head No acute intracranial abnormalities Small vessel ischemic disease and brain atrophy  05/27/2009 MRI of Cervical Spine Advance degenerative cervical spondylosis with disc disease and facet disease. Multilevel discussed above at the individual levels  05/29/2010 X-Ray of the Right Knee Mild to moderate osteoarthritis Cannot exclude tiny joint effusion  05/06/2011 MRI of the Right Knee Tricompartmental degenerative disease most advanced medially. Extensive degenerative tearing of the posterior horn and body of medial meniscus with associated meniscal  chondrocalcinosis and possible meniscal ossification  No acute osteochondral or ligamentous findings Small to moderate mildly  complex Bakers.s cyst  08/09/2010 Abdomen x-ray negative 08/10/2010 CT Head no changed since 05/09/09, the possibility of normal pressure hydrocephalus does exist but does not strongly suggested. 1972- OCG/UGI normal  04/12/11 2D Echo: - Left ventricle: The cavity size was normal. There was   moderate concentric hypertrophy. Systolic function was   normal. The estimated ejection fraction was in the range   of 55% to 60%. Wall motion was normal; there were no   regional wall motion abnormalities. Doppler parameters are   consistent with restrictive physiology, indicative of   decreased left ventricular diastolic compliance and/or   increased left atrial pressure. - Mitral valve: Calcified annulus. - Left atrium: The atrium was moderately dilated. - Right atrium: The atrium was mildly dilated. Transthoracic echocardiography.  M-mode, complete 2D, spectral Doppler, and color Doppler.  Height:  Height: 190.5cm. Height: 75in.  Weight:  Weight: 114.3kg. Weight: 251.5lb.  Body mass index:  BMI: 31.5kg/m^2.  Body surface area:    BSA: 2.30m^2.  Blood pressure:     154/92.  Patient status:  Outpatient.  Location:  [facility]  ------------------------------------------------------------  ------------------------------------------------------------ Left ventricle:  The cavity size was normal. There was moderate concentric hypertrophy. Systolic function was normal. The estimated ejection fraction was in the range of 55% to 60%. Wall motion was normal; there were no regional wall motion abnormalities. Doppler parameters are consistent with restrictive physiology, indicative of decreased left ventricular diastolic compliance and/or increased left atrial pressure.  ------------------------------------------------------------ Aortic valve:   Trileaflet; mildly  thickened leaflets. Cusp separation was normal.  Doppler:  Transvalvular velocity was within the normal range. There was no stenosis.  No regurgitation.  ------------------------------------------------------------ Aorta:  Aortic root: The aortic root was normal in size.  ------------------------------------------------------------ Mitral valve:   Calcified annulus. Leaflet separation was normal.  Doppler:  Transvalvular velocity was within the normal range. There was no evidence for stenosis.  Trivial regurgitation.    Peak gradient: 56mm Hg (D).  ------------------------------------------------------------ Left atrium:  The atrium was moderately dilated.  ------------------------------------------------------------ Right ventricle:  The cavity size was normal. Systolic function was normal.  ------------------------------------------------------------ Pulmonic valve:    The valve appears to be grossly normal.   ------------------------------------------------------------ Tricuspid valve:   Structurally normal valve.   Leaflet separation was normal.  Doppler:  Transvalvular velocity was within the normal range.  Trivial regurgitation.  ------------------------------------------------------------ Right atrium:  The atrium was mildly dilated.  ------------------------------------------------------------ Pericardium:  There was no pericardial effusion.  ------------------------------------------------------------ Systemic veins: Inferior vena cava: The vessel was normal in size; the respirophasic diameter changes were in the normal range (= 50%); findings are consistent with normal central venous pressure.   11/22/12 MRI shoulders: 1.  Complete supraspinatus tendon tear with 1.5-2.5 cm of retraction and mild atrophy. 2.  Large partial tear of the subscapularis with 1-2 cm of retraction and marked subscapularis atrophy. 3.  Complete tear of the biceps tendon from the superior  labrum. 4.  Bulky acromioclavicular osteoarthritis. 5.  Subacromial/subdeltoid bursitis.   Social History: History   Social History  . Marital Status: Married    Spouse Name: N/A    Number of Children: N/A  . Years of Education: N/A   Social History Main Topics  . Smoking status: Former Smoker -- 2.00 packs/day for 5 years    Types: Cigarettes  . Smokeless tobacco: Never Used     Comment: 05/03/2012 "smoked from age 36 to 2"  . Alcohol Use: No     Comment: denies  . Drug Use: No  . Sexual Activity: Yes    Partners: Female   Other Topics Concern  . None   Social History Narrative   ** Merged History Encounter **        Family History Family Status  Relation Status Death Age  . Father Deceased 38    Cause of Death: Pneumonia  . Mother Deceased 36    Cause of Death: Hepatitis  . Sister Alive   . Daughter Alive   . Son Alive   . Son Alive    Family History  Problem Relation Age of Onset  . Colon cancer Neg Hx   . Hypertension Father      Medications: Patient's Medications  New Prescriptions   No medications on file  Previous Medications   ASPIRIN EC 81 MG TABLET    Take 1 tablet (81  mg total) by mouth daily.   HYDROCHLOROTHIAZIDE (HYDRODIURIL) 25 MG TABLET    Take 12.5 mg by mouth 2 (two) times daily.   HYDROCODONE-IBUPROFEN (VICOPROFEN) 7.5-200 MG PER TABLET    Take 2 tablets by mouth at bedtime as needed for moderate pain. In shoulder   LOSARTAN (COZAAR) 50 MG TABLET    Take 1 tablet (50 mg total) by mouth daily.   METRONIDAZOLE (FLAGYL) 500 MG TABLET    One three times daily to treat infection   NEXIUM 40 MG CAPSULE       ONDANSETRON (ZOFRAN) 4 MG TABLET    One every 8 hous if needed for nausea.   VITAMIN C (ASCORBIC ACID) 500 MG TABLET    Take 2,000 mg by mouth daily.   Modified Medications   No medications on file  Discontinued Medications   No medications on file    Immunization History  Administered Date(s) Administered  .  Influenza-Unspecified 02/24/2010, 04/02/2011, 02/02/2012, 01/29/2013  . Pneumococcal Polysaccharide-23 05/27/2009  . Tdap 06/02/2011  . Zoster 06/01/2011     Review of Systems  Constitutional: Positive for fatigue. Negative for fever and chills.  HENT: Positive for hearing loss. Negative for ear pain.   Eyes: Negative.   Respiratory: Negative.   Cardiovascular: Negative.        Patient has hypertension.   Gastrointestinal: Positive for nausea and vomiting. Negative for diarrhea, constipation, blood in stool and abdominal distention.  Endocrine: Negative.   Genitourinary: Negative.   Musculoskeletal:       Shoulder pains, worse on the right.  Skin: Negative.   Neurological: Negative.   Hematological: Negative.   Psychiatric/Behavioral: Negative.       Filed Vitals:   10/24/13 1215  BP: 170/90  Pulse: 62  Temp: 98 F (36.7 C)  TempSrc: Oral  Resp: 20  Height: 6\' 3"  (1.905 m)  SpO2: 98%   There is no weight on file to calculate BMI.  Physical Exam  Constitutional: He is oriented to person, place, and time. He appears well-developed and well-nourished. No distress.  Moderately overweight  HENT:  Head: Normocephalic and atraumatic.  Significant partial deafness bilaterally.  Eyes: Conjunctivae and EOM are normal. Pupils are equal, round, and reactive to light.  Neck: Normal range of motion. Neck supple. No JVD present. No tracheal deviation present. No thyromegaly present.  Cardiovascular: Normal rate, regular rhythm, normal heart sounds and intact distal pulses.  Exam reveals no gallop and no friction rub.   No murmur heard. Pulmonary/Chest: Effort normal and breath sounds normal. No respiratory distress. He has no wheezes. He has no rales. He exhibits no tenderness.  Abdominal: Soft. Bowel sounds are normal. He exhibits no distension and no mass. There is no tenderness.  Musculoskeletal: He exhibits no edema and no tenderness.  Shoulder pain at rest and with movement  Raising the right arm or rotational movement through the shoulder are very painful.  Lymphadenopathy:    He has no cervical adenopathy.  Neurological: He is alert and oriented to person, place, and time. No cranial nerve deficit.  Skin: Skin is dry. No rash noted. No erythema. No pallor.  Psychiatric: He has a normal mood and affect. His behavior is normal. Judgment and thought content normal.        Labs reviewed: Appointment on 10/18/2013  Component Date Value Ref Range Status  . WBC 10/18/2013 4.1  3.4 - 10.8 x10E3/uL Final  . RBC 10/18/2013 4.22  4.14 - 5.80 x10E6/uL Final  . Hemoglobin  10/18/2013 13.3  12.6 - 17.7 g/dL Final  . HCT 10/18/2013 38.3  37.5 - 51.0 % Final  . MCV 10/18/2013 91  79 - 97 fL Final  . MCH 10/18/2013 31.5  26.6 - 33.0 pg Final  . MCHC 10/18/2013 34.7  31.5 - 35.7 g/dL Final  . RDW 10/18/2013 14.1  12.3 - 15.4 % Final  . Neutrophils Relative % 10/18/2013 60   Final  . Lymphs 10/18/2013 25   Final  . Monocytes 10/18/2013 11   Final  . Eos 10/18/2013 3   Final  . Basos 10/18/2013 1   Final  . Neutrophils Absolute 10/18/2013 2.4  1.4 - 7.0 x10E3/uL Final  . Lymphocytes Absolute 10/18/2013 1.0  0.7 - 3.1 x10E3/uL Final  . Monocytes Absolute 10/18/2013 0.4  0.1 - 0.9 x10E3/uL Final  . Eosinophils Absolute 10/18/2013 0.1  0.0 - 0.4 x10E3/uL Final  . Basophils Absolute 10/18/2013 0.1  0.0 - 0.2 x10E3/uL Final  . Immature Granulocytes 10/18/2013 0   Final  . Immature Grans (Abs) 10/18/2013 0.0  0.0 - 0.1 x10E3/uL Final  . Glucose 10/18/2013 94  65 - 99 mg/dL Final  . BUN 10/18/2013 18  8 - 27 mg/dL Final  . Creatinine, Ser 10/18/2013 1.23  0.76 - 1.27 mg/dL Final  . GFR calc non Af Amer 10/18/2013 58* >59 mL/min/1.73 Final  . GFR calc Af Amer 10/18/2013 67  >59 mL/min/1.73 Final  . BUN/Creatinine Ratio 10/18/2013 15  10 - 22 Final  . Sodium 10/18/2013 142  134 - 144 mmol/L Final  . Potassium 10/18/2013 4.9  3.5 - 5.2 mmol/L Final  . Chloride 10/18/2013  103  97 - 108 mmol/L Final  . CO2 10/18/2013 27  18 - 29 mmol/L Final  . Calcium 10/18/2013 9.6  8.6 - 10.2 mg/dL Final  . Total Protein 10/18/2013 5.8* 6.0 - 8.5 g/dL Final  . Albumin 10/18/2013 4.2  3.5 - 4.8 g/dL Final  . Globulin, Total 10/18/2013 1.6  1.5 - 4.5 g/dL Final  . Albumin/Globulin Ratio 10/18/2013 2.6* 1.1 - 2.5 Final  . Total Bilirubin 10/18/2013 0.7  0.0 - 1.2 mg/dL Final  . Alkaline Phosphatase 10/18/2013 69  39 - 117 IU/L Final  . AST 10/18/2013 44* 0 - 40 IU/L Final  . ALT 10/18/2013 35  0 - 44 IU/L Final  Appointment on 10/11/2013  Component Date Value Ref Range Status  . C difficile by pcr 10/11/2013 Positive* Negative Final   Comment: Toxigenic C difficile: Positive                          Epidemic Strain Bl/NAP1/027: Presumptive Negative     Assessment/Plan  1. Chronic constipation Given samples of Movantik 25 mg to use if needed for constipation. He was advised to try to cut back on hydrocodone if possible because this is likely aggravating the constipation. He uses the hydrocodone for his very painful shoulders.  2. Atrial flutter Currently in NSR  3. Rotator cuff tear arthropathy of right shoulder Limits motion. chronically painful.  4. HTN (hypertension) Continue current medications and add aliskiren (TEKTURNA) 300 MG tablet; One daily to control blood pressure  Dispense: 30 tablet; Refill: 5  5. CKD (chronic kidney disease) Normal BUN  and creatinine  6. Obesity, unspecified Weight slides up and down.   7. Partial deafness Limits coimmunication

## 2013-10-27 MED ORDER — ALISKIREN FUMARATE 300 MG PO TABS: ORAL_TABLET | ORAL | Status: DC

## 2013-11-14 ENCOUNTER — Encounter: Payer: Self-pay | Admitting: Internal Medicine

## 2013-11-20 ENCOUNTER — Other Ambulatory Visit: Payer: Self-pay | Admitting: *Deleted

## 2013-11-20 DIAGNOSIS — I10 Essential (primary) hypertension: Secondary | ICD-10-CM

## 2013-11-20 MED ORDER — ALISKIREN FUMARATE 300 MG PO TABS: ORAL_TABLET | ORAL | Status: DC

## 2013-11-20 NOTE — Telephone Encounter (Signed)
Patient called wanting a refill

## 2013-11-29 ENCOUNTER — Other Ambulatory Visit: Payer: Self-pay

## 2013-11-29 DIAGNOSIS — M75101 Unspecified rotator cuff tear or rupture of right shoulder, not specified as traumatic: Principal | ICD-10-CM

## 2013-11-29 DIAGNOSIS — M12811 Other specific arthropathies, not elsewhere classified, right shoulder: Secondary | ICD-10-CM

## 2013-11-29 MED ORDER — HYDROCODONE-IBUPROFEN 7.5-200 MG PO TABS: 2.0000 | ORAL_TABLET | Freq: Every evening | ORAL | Status: DC | PRN

## 2013-12-17 ENCOUNTER — Telehealth: Payer: Self-pay | Admitting: *Deleted

## 2013-12-17 MED ORDER — LOSARTAN POTASSIUM-HCTZ 100-25 MG PO TABS: ORAL_TABLET | ORAL | Status: DC

## 2013-12-17 NOTE — Telephone Encounter (Signed)
Patient Notified and agreed. Faxed Rx to pharmacy

## 2013-12-17 NOTE — Telephone Encounter (Signed)
Patient called and stated that there has been a change in his BP and you told him to call you if there was. Patient stated that his BP has been increased for the last 2-3 days. Patient has been taken off of Texturna and he doesn't think the new BP medication is working as well.  BP ran 195 to 200 top number. Patient not sure of bottom number reading. Patient stated that it has been a stressful week but doesn't think it is doing as well as the Texturna. But patient came off of it because he could not afford it. Is there something comparable Please Advise.

## 2013-12-17 NOTE — Telephone Encounter (Signed)
Change losartan to Losartan/ HCT 100/25 1 daily to control BP

## 2014-01-04 ENCOUNTER — Other Ambulatory Visit: Payer: Self-pay | Admitting: *Deleted

## 2014-01-04 DIAGNOSIS — M12811 Other specific arthropathies, not elsewhere classified, right shoulder: Secondary | ICD-10-CM

## 2014-01-04 DIAGNOSIS — M75101 Unspecified rotator cuff tear or rupture of right shoulder, not specified as traumatic: Principal | ICD-10-CM

## 2014-01-04 MED ORDER — HYDROCODONE-IBUPROFEN 7.5-200 MG PO TABS: 2.0000 | ORAL_TABLET | Freq: Every evening | ORAL | Status: DC | PRN

## 2014-01-09 ENCOUNTER — Ambulatory Visit (INDEPENDENT_AMBULATORY_CARE_PROVIDER_SITE_OTHER): Payer: Medicare Other | Admitting: Internal Medicine

## 2014-01-09 ENCOUNTER — Encounter: Payer: Self-pay | Admitting: Internal Medicine

## 2014-01-09 VITALS — BP 142/84 | HR 60 | Temp 98.1°F | Ht 74.5 in | Wt 247.0 lb

## 2014-01-09 DIAGNOSIS — M12811 Other specific arthropathies, not elsewhere classified, right shoulder: Secondary | ICD-10-CM

## 2014-01-09 DIAGNOSIS — M19019 Primary osteoarthritis, unspecified shoulder: Secondary | ICD-10-CM

## 2014-01-09 DIAGNOSIS — M75101 Unspecified rotator cuff tear or rupture of right shoulder, not specified as traumatic: Secondary | ICD-10-CM

## 2014-01-09 DIAGNOSIS — I10 Essential (primary) hypertension: Secondary | ICD-10-CM

## 2014-01-09 DIAGNOSIS — L84 Corns and callosities: Secondary | ICD-10-CM

## 2014-01-09 MED ORDER — OXYCODONE-ACETAMINOPHEN 10-325 MG PO TABS: ORAL_TABLET | ORAL | Status: DC

## 2014-01-09 NOTE — Progress Notes (Signed)
Patient ID: [name] Camp., male   DOB: 1941/05/27, 73 y.o.   MRN: [id number]    Location:    [facility]  Place of Service:  OFFICE    Allergies  Allergen Reactions  . Amitriptyline   . Azithromycin     Stomach pain  . Flexeril [Cyclobenzaprine]     Causes dizziness    Chief Complaint  Patient presents with  . Medication Management    Discuss pain medication   . Foot Problem    Examine right foot - 4th toe     HPI:  Corn of toe: left 3rd toe. Painful.  Essential hypertension: controlled  Rotator cuff tear arthropathy of right shoulder; severe pain. Has seen 4 ortho; locally for advice about repair, but they all said it cannot be done.   Medications: Patient's Medications  New Prescriptions   No medications on file  Previous Medications   ASPIRIN EC 81 MG TABLET    Take 1 tablet (81 mg total) by mouth daily.   HYDROCHLOROTHIAZIDE (HYDRODIURIL) 25 MG TABLET    Take 12.5 mg by mouth 2 (two) times daily.   HYDROCODONE-IBUPROFEN (VICOPROFEN) 7.5-200 MG PER TABLET    Take 2 tablets by mouth at bedtime as needed for moderate pain. In shoulder   LOSARTAN-HYDROCHLOROTHIAZIDE (HYZAAR) 100-25 MG PER TABLET    Take one tablet by mouth once daily to control blood pressure   NEXIUM 40 MG CAPSULE       ONDANSETRON (ZOFRAN) 4 MG TABLET    One every 8 hous if needed for nausea.   VITAMIN C (ASCORBIC ACID) 500 MG TABLET    Take 2,000 mg by mouth daily.   Modified Medications   No medications on file  Discontinued Medications   ALISKIREN (TEKTURNA) 300 MG TABLET    Take One tablet once daily to control blood pressure     Review of Systems  Constitutional: Positive for fatigue. Negative for fever and chills.  HENT: Positive for hearing loss. Negative for ear pain.   Eyes: Negative.   Respiratory: Negative.   Cardiovascular: Negative.        Patient has hypertension.   Gastrointestinal: Positive for nausea and vomiting. Negative for diarrhea, constipation, blood in stool and  abdominal distention.  Endocrine: Negative.   Genitourinary: Negative.   Musculoskeletal:       Shoulder pains, worse on the right.  Skin: Negative.   Neurological: Negative.   Hematological: Negative.   Psychiatric/Behavioral: Negative.     Filed Vitals:   01/09/14 1306  BP: 142/84  Pulse: 60  Temp: 98.1 F (36.7 C)  TempSrc: Oral  Height: 6' 2.5" (1.892 m)  Weight: 247 lb (112.038 kg)  SpO2: 97%   Body mass index is 31.3 kg/(m^2).  Physical Exam  Constitutional: He is oriented to person, place, and time. He appears well-developed and well-nourished. No distress.  Moderately overweight  HENT:  Head: Normocephalic and atraumatic.  Significant partial deafness bilaterally.  Eyes: Conjunctivae and EOM are normal. Pupils are equal, round, and reactive to light.  Neck: Normal range of motion. Neck supple. No JVD present. No tracheal deviation present. No thyromegaly present.  Cardiovascular: Normal rate, regular rhythm, normal heart sounds and intact distal pulses.  Exam reveals no gallop and no friction rub.   No murmur heard. Pulmonary/Chest: Effort normal and breath sounds normal. No respiratory distress. He has no wheezes. He has no rales. He exhibits no tenderness.  Abdominal: Soft. Bowel sounds are normal. He exhibits no distension and  no mass. There is no tenderness.  Musculoskeletal: He exhibits no edema and no tenderness.  Shoulder pain at rest and with movement Raising the right arm or rotational movement through the shoulder are very painful.  Lymphadenopathy:    He has no cervical adenopathy.  Neurological: He is alert and oriented to person, place, and time. No cranial nerve deficit.  Skin: Skin is dry. No rash noted. No erythema. No pallor.  Corn left 3rd toe  Psychiatric: He has a normal mood and affect. His behavior is normal. Judgment and thought content normal.     Labs reviewed: Appointment on 10/18/2013  Component Date Value Ref Range Status  . WBC  10/18/2013 4.1  3.4 - 10.8 x10E3/uL Final  . RBC 10/18/2013 4.22  4.14 - 5.80 x10E6/uL Final  . Hemoglobin 10/18/2013 13.3  12.6 - 17.7 g/dL Final  . HCT 10/18/2013 38.3  37.5 - 51.0 % Final  . MCV 10/18/2013 91  79 - 97 fL Final  . MCH 10/18/2013 31.5  26.6 - 33.0 pg Final  . MCHC 10/18/2013 34.7  31.5 - 35.7 g/dL Final  . RDW 10/18/2013 14.1  12.3 - 15.4 % Final  . Neutrophils Relative % 10/18/2013 60   Final  . Lymphs 10/18/2013 25   Final  . Monocytes 10/18/2013 11   Final  . Eos 10/18/2013 3   Final  . Basos 10/18/2013 1   Final  . Neutrophils Absolute 10/18/2013 2.4  1.4 - 7.0 x10E3/uL Final  . Lymphocytes Absolute 10/18/2013 1.0  0.7 - 3.1 x10E3/uL Final  . Monocytes Absolute 10/18/2013 0.4  0.1 - 0.9 x10E3/uL Final  . Eosinophils Absolute 10/18/2013 0.1  0.0 - 0.4 x10E3/uL Final  . Basophils Absolute 10/18/2013 0.1  0.0 - 0.2 x10E3/uL Final  . Immature Granulocytes 10/18/2013 0   Final  . Immature Grans (Abs) 10/18/2013 0.0  0.0 - 0.1 x10E3/uL Final  . Glucose 10/18/2013 94  65 - 99 mg/dL Final  . BUN 10/18/2013 18  8 - 27 mg/dL Final  . Creatinine, Ser 10/18/2013 1.23  0.76 - 1.27 mg/dL Final  . GFR calc non Af Amer 10/18/2013 58* >59 mL/min/1.73 Final  . GFR calc Af Amer 10/18/2013 67  >59 mL/min/1.73 Final  . BUN/Creatinine Ratio 10/18/2013 15  10 - 22 Final  . Sodium 10/18/2013 142  134 - 144 mmol/L Final  . Potassium 10/18/2013 4.9  3.5 - 5.2 mmol/L Final  . Chloride 10/18/2013 103  97 - 108 mmol/L Final  . CO2 10/18/2013 27  18 - 29 mmol/L Final  . Calcium 10/18/2013 9.6  8.6 - 10.2 mg/dL Final  . Total Protein 10/18/2013 5.8* 6.0 - 8.5 g/dL Final  . Albumin 10/18/2013 4.2  3.5 - 4.8 g/dL Final  . Globulin, Total 10/18/2013 1.6  1.5 - 4.5 g/dL Final  . Albumin/Globulin Ratio 10/18/2013 2.6* 1.1 - 2.5 Final  . Total Bilirubin 10/18/2013 0.7  0.0 - 1.2 mg/dL Final  . Alkaline Phosphatase 10/18/2013 69  39 - 117 IU/L Final  . AST 10/18/2013 44* 0 - 40 IU/L Final  .  ALT 10/18/2013 35  0 - 44 IU/L Final  Appointment on 10/11/2013  Component Date Value Ref Range Status  . C difficile by pcr 10/11/2013 Positive* Negative Final   Comment: Toxigenic C difficile: Positive                          Epidemic Strain Bl/NAP1/027: Presumptive Negative  Assessment/Plan 1. Corn of toe sharply debrided. Underlying ulcer present. Small amount of bleeding.  2. Essential hypertension controlled  3. Rotator cuff tear arthropathy of [facility] -Stop hydrocodone - oxyCODONE-acetaminophen (PERCOCET) 10-325 MG per tablet; One every 6 hours if needed for pain  Dispense: 120 tablet; Refill: 0

## 2014-02-20 ENCOUNTER — Ambulatory Visit (INDEPENDENT_AMBULATORY_CARE_PROVIDER_SITE_OTHER): Payer: Medicare Other | Admitting: Internal Medicine

## 2014-02-20 DIAGNOSIS — Z23 Encounter for immunization: Secondary | ICD-10-CM

## 2014-02-20 MED ORDER — OXYCODONE HCL 30 MG PO TABS: ORAL_TABLET | ORAL | Status: DC

## 2014-02-21 ENCOUNTER — Ambulatory Visit: Payer: Self-pay

## 2014-03-05 ENCOUNTER — Telehealth: Payer: Self-pay | Admitting: *Deleted

## 2014-03-05 NOTE — Telephone Encounter (Signed)
Per Dr. Nyoka Cowden called patient to come in tomorrow at 11:15. Patient will be here.

## 2014-03-05 NOTE — Telephone Encounter (Signed)
Patient called and stated that his foot has not healed and is very sore to walk on. Wants to know what to do to help it heal. Over the years been cutting out a plantars wart and this time cut deeper and it hasn't healed. Not bleeding or infected but very sore. Please Advise.

## 2014-03-06 ENCOUNTER — Ambulatory Visit (INDEPENDENT_AMBULATORY_CARE_PROVIDER_SITE_OTHER): Payer: Medicare Other | Admitting: Internal Medicine

## 2014-03-06 VITALS — Temp 98.7°F

## 2014-03-06 DIAGNOSIS — M79674 Pain in right toe(s): Secondary | ICD-10-CM

## 2014-03-06 DIAGNOSIS — M79676 Pain in unspecified toe(s): Secondary | ICD-10-CM | POA: Insufficient documentation

## 2014-03-06 NOTE — Progress Notes (Signed)
Patient ID: [name] Camp., male   DOB: 12/17/1940, 73 y.o.   MRN: [id number]    Facility  [facility]    Place of Service:   OFFICE   Allergies  Allergen Reactions  . Amitriptyline   . Azithromycin     Stomach pain  . Flexeril [Cyclobenzaprine]     Causes dizziness    Chief Complaint  Patient presents with  . Foot Pain    HPI:  Pain in the right 3rd toe tip. He is developing a hammer toe and gets a callus at the tip of the toe that becomes painful.  Medications: Patient's Medications  New Prescriptions   No medications on file  Previous Medications   ASPIRIN EC 81 MG TABLET    Take 1 tablet (81 mg total) by mouth daily.   HYDROCHLOROTHIAZIDE (HYDRODIURIL) 25 MG TABLET    Take 12.5 mg by mouth 2 (two) times daily.   HYDROCODONE-IBUPROFEN (VICOPROFEN) 7.5-200 MG PER TABLET       LOSARTAN-HYDROCHLOROTHIAZIDE (HYZAAR) 100-25 MG PER TABLET    Take one tablet by mouth once daily to control blood pressure   NEXIUM 40 MG CAPSULE       ONDANSETRON (ZOFRAN) 4 MG TABLET    One every 8 hous if needed for nausea.   OXYCODONE (ROXICODONE) 30 MG IMMEDIATE RELEASE TABLET    One every 3 hours if needed to control pain   OXYCODONE-ACETAMINOPHEN (PERCOCET) 10-325 MG PER TABLET    One every 6 hours if needed for pain   VITAMIN C (ASCORBIC ACID) 500 MG TABLET    Take 2,000 mg by mouth daily.   Modified Medications   No medications on file  Discontinued Medications   No medications on file     Review of Systems  Constitutional: Positive for fatigue. Negative for fever and chills.  HENT: Positive for hearing loss. Negative for ear pain.   Eyes: Negative.   Respiratory: Negative.   Cardiovascular: Negative.        Patient has hypertension.   Gastrointestinal: Positive for nausea and vomiting. Negative for diarrhea, constipation, blood in stool and abdominal distention.  Endocrine: Negative.   Genitourinary: Negative.   Musculoskeletal:       Shoulder pains, worse on the right.  Skin:      Painful callus at the tip of the right 3rd toe.  Neurological: Negative.   Hematological: Negative.   Psychiatric/Behavioral: Negative.     Filed Vitals:   03/06/14 1120  Temp: 98.7 F (37.1 C)   There is no weight on file to calculate BMI.  Physical Exam  Constitutional: He is oriented to person, place, and time. He appears well-developed and well-nourished. No distress.  Moderately overweight  HENT:  Head: Normocephalic and atraumatic.  Significant partial deafness bilaterally.  Eyes: Conjunctivae and EOM are normal. Pupils are equal, round, and reactive to light.  Neck: Normal range of motion. Neck supple. No JVD present. No tracheal deviation present. No thyromegaly present.  Cardiovascular: Normal rate, regular rhythm, normal heart sounds and intact distal pulses.  Exam reveals no gallop and no friction rub.   No murmur heard. Pulmonary/Chest: Effort normal and breath sounds normal. No respiratory distress. He has no wheezes. He has no rales. He exhibits no tenderness.  Abdominal: Soft. Bowel sounds are normal. He exhibits no distension and no mass. There is no tenderness.  Musculoskeletal: He exhibits no edema and no tenderness.  Shoulder pain at rest and with movement Raising the right arm or rotational  movement through the shoulder are very painful. Hammer toe right 3rd.  Lymphadenopathy:    He has no cervical adenopathy.  Neurological: He is alert and oriented to person, place, and time. No cranial nerve deficit.  Skin: Skin is dry. No rash noted. No erythema. No pallor.  Painful callus tip of left 3rd toe  Psychiatric: He has a normal mood and affect. His behavior is normal. Judgment and thought content normal.     Labs reviewed: No visits with results within 3 Month(s) from this visit. Latest known visit with results is:  Appointment on 10/18/2013  Component Date Value Ref Range Status  . WBC 10/18/2013 4.1  3.4 - 10.8 x10E3/uL Final  . RBC 10/18/2013 4.22   4.14 - 5.80 x10E6/uL Final  . Hemoglobin 10/18/2013 13.3  12.6 - 17.7 g/dL Final  . HCT 10/18/2013 38.3  37.5 - 51.0 % Final  . MCV 10/18/2013 91  79 - 97 fL Final  . MCH 10/18/2013 31.5  26.6 - 33.0 pg Final  . MCHC 10/18/2013 34.7  31.5 - 35.7 g/dL Final  . RDW 10/18/2013 14.1  12.3 - 15.4 % Final  . Neutrophils Relative % 10/18/2013 60   Final  . Lymphs 10/18/2013 25   Final  . Monocytes 10/18/2013 11   Final  . Eos 10/18/2013 3   Final  . Basos 10/18/2013 1   Final  . Neutrophils Absolute 10/18/2013 2.4  1.4 - 7.0 x10E3/uL Final  . Lymphocytes Absolute 10/18/2013 1.0  0.7 - 3.1 x10E3/uL Final  . Monocytes Absolute 10/18/2013 0.4  0.1 - 0.9 x10E3/uL Final  . Eosinophils Absolute 10/18/2013 0.1  0.0 - 0.4 x10E3/uL Final  . Basophils Absolute 10/18/2013 0.1  0.0 - 0.2 x10E3/uL Final  . Immature Granulocytes 10/18/2013 0   Final  . Immature Grans (Abs) 10/18/2013 0.0  0.0 - 0.1 x10E3/uL Final  . Glucose 10/18/2013 94  65 - 99 mg/dL Final  . BUN 10/18/2013 18  8 - 27 mg/dL Final  . Creatinine, Ser 10/18/2013 1.23  0.76 - 1.27 mg/dL Final  . GFR calc non Af Amer 10/18/2013 58* >59 mL/min/1.73 Final  . GFR calc Af Amer 10/18/2013 67  >59 mL/min/1.73 Final  . BUN/Creatinine Ratio 10/18/2013 15  10 - 22 Final  . Sodium 10/18/2013 142  134 - 144 mmol/L Final  . Potassium 10/18/2013 4.9  3.5 - 5.2 mmol/L Final  . Chloride 10/18/2013 103  97 - 108 mmol/L Final  . CO2 10/18/2013 27  18 - 29 mmol/L Final  . Calcium 10/18/2013 9.6  8.6 - 10.2 mg/dL Final  . Total Protein 10/18/2013 5.8* 6.0 - 8.5 g/dL Final  . Albumin 10/18/2013 4.2  3.5 - 4.8 g/dL Final  . Globulin, Total 10/18/2013 1.6  1.5 - 4.5 g/dL Final  . Albumin/Globulin Ratio 10/18/2013 2.6* 1.1 - 2.5 Final  . Total Bilirubin 10/18/2013 0.7  0.0 - 1.2 mg/dL Final  . Alkaline Phosphatase 10/18/2013 69  39 - 117 IU/L Final  . AST 10/18/2013 44* 0 - 40 IU/L Final  . ALT 10/18/2013 35  0 - 44 IU/L Final     Assessment/Plan  Pain  of toe of right foot Sharply debrided the callus from the right 3rd toe. Pain relieved and patient walking better at end of procedure. Discussed possible referral to foot surgeon or podiatrist in the future. For now, periodic debridement seems reasonable. He will try tubular toe gel bandages.

## 2014-03-21 ENCOUNTER — Other Ambulatory Visit: Payer: Self-pay | Admitting: *Deleted

## 2014-03-21 DIAGNOSIS — M12811 Other specific arthropathies, not elsewhere classified, right shoulder: Secondary | ICD-10-CM

## 2014-03-21 DIAGNOSIS — M75101 Unspecified rotator cuff tear or rupture of right shoulder, not specified as traumatic: Principal | ICD-10-CM

## 2014-03-21 MED ORDER — OXYCODONE HCL 30 MG PO TABS: ORAL_TABLET | ORAL | Status: DC

## 2014-03-21 NOTE — Telephone Encounter (Signed)
Patient wife requested and will pick up 

## 2014-04-01 ENCOUNTER — Telehealth: Payer: Self-pay | Admitting: *Deleted

## 2014-04-01 NOTE — Telephone Encounter (Signed)
Patient called and stated that the Rx he received was a Hydrocodone Rx instead of a Oxycodone Rx. Reviewed patient's chart and the Oxycodone Rx was printed 03/21/14 and it is still up front in drawer. Patient made aware and I told him to bring back the other Rx given to him and he can pick up this one. He agreed.

## 2014-04-11 ENCOUNTER — Telehealth: Payer: Self-pay | Admitting: *Deleted

## 2014-04-11 DIAGNOSIS — M25572 Pain in left ankle and joints of left foot: Secondary | ICD-10-CM

## 2014-04-11 NOTE — Telephone Encounter (Signed)
Schedule X-ray of the ankle and foot. Which one is it?

## 2014-04-11 NOTE — Telephone Encounter (Signed)
Patient called and left message that he fell out of his bed about a week ago and hit his Ankle/Heel on the bed and it is very sore. Patient wants to know if you think he needs to go and get an X-Ray of the foot to make sure nothing is fractured. Patient states that he can live with it but something just don't feel right. Please Advise.

## 2014-04-11 NOTE — Telephone Encounter (Signed)
Left Foot. Order placed and patient notified.

## 2014-04-15 ENCOUNTER — Ambulatory Visit
Admission: RE | Admit: 2014-04-15 | Discharge: 2014-04-15 | Disposition: A | Discharge status: Home / Self Care | Payer: Medicare Other | Source: Ambulatory Visit | Attending: Internal Medicine | Admitting: Internal Medicine

## 2014-04-15 DIAGNOSIS — M25572 Pain in left ankle and joints of left foot: Secondary | ICD-10-CM

## 2014-05-01 ENCOUNTER — Ambulatory Visit: Payer: Self-pay | Admitting: Internal Medicine

## 2014-05-03 ENCOUNTER — Other Ambulatory Visit: Payer: Self-pay | Admitting: *Deleted

## 2014-05-03 DIAGNOSIS — M12811 Other specific arthropathies, not elsewhere classified, right shoulder: Secondary | ICD-10-CM

## 2014-05-03 DIAGNOSIS — M75101 Unspecified rotator cuff tear or rupture of right shoulder, not specified as traumatic: Principal | ICD-10-CM

## 2014-05-03 MED ORDER — OXYCODONE HCL 30 MG PO TABS: ORAL_TABLET | ORAL | Status: DC

## 2014-05-03 NOTE — Telephone Encounter (Signed)
Patient called and requested and will pick up 

## 2014-05-09 ENCOUNTER — Encounter (HOSPITAL_COMMUNITY): Payer: Self-pay | Admitting: Internal Medicine

## 2014-05-28 ENCOUNTER — Other Ambulatory Visit: Payer: Self-pay | Admitting: *Deleted

## 2014-05-28 MED ORDER — HYDROCHLOROTHIAZIDE 25 MG PO TABS: 12.5000 mg | ORAL_TABLET | Freq: Two times a day (BID) | ORAL | Status: DC

## 2014-05-28 NOTE — Telephone Encounter (Signed)
Patient requested refill to be faxed to pharmacy.  

## 2014-06-06 ENCOUNTER — Other Ambulatory Visit: Payer: Self-pay | Admitting: *Deleted

## 2014-06-06 DIAGNOSIS — M12811 Other specific arthropathies, not elsewhere classified, right shoulder: Secondary | ICD-10-CM

## 2014-06-06 DIAGNOSIS — M75101 Unspecified rotator cuff tear or rupture of right shoulder, not specified as traumatic: Principal | ICD-10-CM

## 2014-06-06 MED ORDER — OXYCODONE HCL 30 MG PO TABS: ORAL_TABLET | ORAL | Status: DC

## 2014-06-06 NOTE — Telephone Encounter (Signed)
Patient requested and will pick up 

## 2014-06-07 ENCOUNTER — Ambulatory Visit: Payer: Self-pay | Admitting: Internal Medicine

## 2014-06-26 ENCOUNTER — Encounter: Payer: Self-pay | Admitting: Internal Medicine

## 2014-06-26 ENCOUNTER — Ambulatory Visit (INDEPENDENT_AMBULATORY_CARE_PROVIDER_SITE_OTHER): Payer: Medicare Other | Admitting: Internal Medicine

## 2014-06-26 VITALS — BP 142/70 | HR 84 | Temp 97.3°F | Ht 75.0 in

## 2014-06-26 DIAGNOSIS — I10 Essential (primary) hypertension: Secondary | ICD-10-CM

## 2014-06-26 DIAGNOSIS — J069 Acute upper respiratory infection, unspecified: Secondary | ICD-10-CM | POA: Insufficient documentation

## 2014-06-26 DIAGNOSIS — M25519 Pain in unspecified shoulder: Secondary | ICD-10-CM | POA: Diagnosis not present

## 2014-06-26 DIAGNOSIS — M79672 Pain in left foot: Secondary | ICD-10-CM | POA: Diagnosis not present

## 2014-06-26 MED ORDER — CHLORPHEN-PHENYLEPH-IBUPROFEN 4-10-200 MG PO TABS: ORAL_TABLET | ORAL | Status: DC

## 2014-06-26 NOTE — Progress Notes (Signed)
Patient ID: [name] Camp., male   DOB: Apr 02, 1941, 74 y.o.   MRN: [id number]    Facility  [facility]    Place of Service:   OFFICE   Allergies  Allergen Reactions  . Amitriptyline   . Azithromycin     Stomach pain  . Flexeril [Cyclobenzaprine]     Causes dizziness    Chief Complaint  Patient presents with  . Acute Visit    Left foot pain, symptoms improved since appointment scheduled. Pain onset 2 months ago after injury   . URI    Sore throat, sneezing, headache, stopped up. Denies cough or fever     HPI:  Acute upper respiratory infection - sinus congestion, headache, cough. No fever.  Pain in left foot: hit foot on the sideboard of the bed about 2 months ago. Did not go to ER. Foot no longer hurts.  Essential hypertension: controlled  Pain in joint, shoulder region, unspecified laterality: no longer painful. He cannot explain the dramatic improvement in discomfort. Denies taking any new medication or any new exercises.   Medications: Patient's Medications  New Prescriptions   No medications on file  Previous Medications   ASPIRIN EC 81 MG TABLET    Take 1 tablet (81 mg total) by mouth daily.   HYDROCHLOROTHIAZIDE (HYDRODIURIL) 25 MG TABLET    Take 0.5 tablets (12.5 mg total) by mouth 2 (two) times daily.   HYDROCODONE-IBUPROFEN (VICOPROFEN) 7.5-200 MG PER TABLET       LOSARTAN-HYDROCHLOROTHIAZIDE (HYZAAR) 100-25 MG PER TABLET    Take one tablet by mouth once daily to control blood pressure   NEXIUM 40 MG CAPSULE       ONDANSETRON (ZOFRAN) 4 MG TABLET    One every 8 hous if needed for nausea.   OXYCODONE (ROXICODONE) 30 MG IMMEDIATE RELEASE TABLET    One every 3 hours if needed to control pain   VITAMIN C (ASCORBIC ACID) 500 MG TABLET    Take 2,000 mg by mouth daily.   Modified Medications   No medications on file  Discontinued Medications   OXYCODONE-ACETAMINOPHEN (PERCOCET) 10-325 MG PER TABLET    One every 6 hours if needed for pain     Review of Systems    Constitutional: Positive for fatigue. Negative for fever and chills.  HENT: Positive for hearing loss and sinus pressure. Negative for ear pain, trouble swallowing and voice change.   Eyes: Negative.   Respiratory: Positive for cough.   Cardiovascular: Negative.        Patient has hypertension.   Gastrointestinal: Positive for nausea and vomiting. Negative for diarrhea, constipation, blood in stool and abdominal distention.  Endocrine: Negative.   Genitourinary: Negative.   Musculoskeletal:       Resolved shoulder pains.  Skin:       Painless atrophic plaque of the left forearm.  Neurological: Negative.   Hematological: Negative.   Psychiatric/Behavioral: Negative.     Filed Vitals:   06/26/14 1328  BP: 142/70  Pulse: 84  Temp: 97.3 F (36.3 C)  TempSrc: Oral  Height: 6\' 3"  (1.905 m)  SpO2: 99%   There is no weight on file to calculate BMI.  Physical Exam  Constitutional: He is oriented to person, place, and time. He appears well-developed and well-nourished. No distress.  Moderately overweight  HENT:  Head: Normocephalic and atraumatic.  Significant partial deafness bilaterally.  Eyes: Conjunctivae and EOM are normal. Pupils are equal, round, and reactive to light.  Neck: Normal range of motion.  Neck supple. No JVD present. No tracheal deviation present. No thyromegaly present.  Cardiovascular: Normal rate, regular rhythm, normal heart sounds and intact distal pulses.  Exam reveals no gallop and no friction rub.   No murmur heard. Pulmonary/Chest: Effort normal and breath sounds normal. No respiratory distress. He has no wheezes. He has no rales. He exhibits no tenderness.  Abdominal: Soft. Bowel sounds are normal. He exhibits no distension and no mass. There is no tenderness.  Musculoskeletal: He exhibits no edema or tenderness.  Hammer toe right 3rd.  Lymphadenopathy:    He has no cervical adenopathy.  Neurological: He is alert and oriented to person, place, and  time. No cranial nerve deficit.  Skin: Skin is dry. No rash noted. No erythema. No pallor.  Atrophic plaque of the left forearm.  Psychiatric: He has a normal mood and affect. His behavior is normal. Judgment and thought content normal.     Labs reviewed: No visits with results within 3 Month(s) from this visit. Latest known visit with results is:  Appointment on 10/18/2013  Component Date Value Ref Range Status  . WBC 10/18/2013 4.1  3.4 - 10.8 x10E3/uL Final  . RBC 10/18/2013 4.22  4.14 - 5.80 x10E6/uL Final  . Hemoglobin 10/18/2013 13.3  12.6 - 17.7 g/dL Final  . HCT 10/18/2013 38.3  37.5 - 51.0 % Final  . MCV 10/18/2013 91  79 - 97 fL Final  . MCH 10/18/2013 31.5  26.6 - 33.0 pg Final  . MCHC 10/18/2013 34.7  31.5 - 35.7 g/dL Final  . RDW 10/18/2013 14.1  12.3 - 15.4 % Final  . Neutrophils Relative % 10/18/2013 60   Final  . Lymphs 10/18/2013 25   Final  . Monocytes 10/18/2013 11   Final  . Eos 10/18/2013 3   Final  . Basos 10/18/2013 1   Final  . Neutrophils Absolute 10/18/2013 2.4  1.4 - 7.0 x10E3/uL Final  . Lymphocytes Absolute 10/18/2013 1.0  0.7 - 3.1 x10E3/uL Final  . Monocytes Absolute 10/18/2013 0.4  0.1 - 0.9 x10E3/uL Final  . Eosinophils Absolute 10/18/2013 0.1  0.0 - 0.4 x10E3/uL Final  . Basophils Absolute 10/18/2013 0.1  0.0 - 0.2 x10E3/uL Final  . Immature Granulocytes 10/18/2013 0   Final  . Immature Grans (Abs) 10/18/2013 0.0  0.0 - 0.1 x10E3/uL Final  . Glucose 10/18/2013 94  65 - 99 mg/dL Final  . BUN 10/18/2013 18  8 - 27 mg/dL Final  . Creatinine, Ser 10/18/2013 1.23  0.76 - 1.27 mg/dL Final  . GFR calc non Af Amer 10/18/2013 58* >59 mL/min/1.73 Final  . GFR calc Af Amer 10/18/2013 67  >59 mL/min/1.73 Final  . BUN/Creatinine Ratio 10/18/2013 15  10 - 22 Final  . Sodium 10/18/2013 142  134 - 144 mmol/L Final  . Potassium 10/18/2013 4.9  3.5 - 5.2 mmol/L Final  . Chloride 10/18/2013 103  97 - 108 mmol/L Final  . CO2 10/18/2013 27  18 - 29 mmol/L Final    . Calcium 10/18/2013 9.6  8.6 - 10.2 mg/dL Final  . Total Protein 10/18/2013 5.8* 6.0 - 8.5 g/dL Final  . Albumin 10/18/2013 4.2  3.5 - 4.8 g/dL Final  . Globulin, Total 10/18/2013 1.6  1.5 - 4.5 g/dL Final  . Albumin/Globulin Ratio 10/18/2013 2.6* 1.1 - 2.5 Final  . Total Bilirubin 10/18/2013 0.7  0.0 - 1.2 mg/dL Final  . Alkaline Phosphatase 10/18/2013 69  39 - 117 IU/L Final  . AST 10/18/2013 44*  0 - 40 IU/L Final  . ALT 10/18/2013 35  0 - 44 IU/L Final     Assessment/Plan 1. Acute upper respiratory infection - Chlorphen-Phenyleph-Ibuprofen 4-10-200 MG TABS; One up to three times daily to help congestion  Dispense: 30 tablet; Refill: 1  2. Pain in left foot resolved  3. Essential hypertension controlled  4. Pain in joint, shoulder region, unspecified laterality improved

## 2014-07-01 DIAGNOSIS — Z23 Encounter for immunization: Secondary | ICD-10-CM | POA: Insufficient documentation

## 2014-07-01 NOTE — Progress Notes (Signed)
Patient ID: [name] Camp., male   DOB: 08/25/1940, 74 y.o.   MRN: [id number] Patient was seen for a flu injection.  Patient received prescription for oxycodone due to complaints of severe shoulder pains.

## 2014-07-12 ENCOUNTER — Other Ambulatory Visit: Payer: Self-pay

## 2014-07-12 DIAGNOSIS — M12811 Other specific arthropathies, not elsewhere classified, right shoulder: Secondary | ICD-10-CM

## 2014-07-12 DIAGNOSIS — M75101 Unspecified rotator cuff tear or rupture of right shoulder, not specified as traumatic: Principal | ICD-10-CM

## 2014-07-12 MED ORDER — OXYCODONE HCL 30 MG PO TABS: ORAL_TABLET | ORAL | Status: DC

## 2014-07-12 NOTE — Telephone Encounter (Signed)
Patient was in office with wife for appointment. Patient requested refill. Patient asked if he can take tylenol arthritis with Oxycodone. Per Dr.Carter, ok to take.   Rx printed, signed and given to patient.

## 2014-07-16 ENCOUNTER — Telehealth: Payer: Self-pay | Admitting: *Deleted

## 2014-07-16 NOTE — Telephone Encounter (Signed)
Patient called and stated that the Plantar Wart came back on his foot and he wants to know if he can have a Podiatrist Referral to [facility]- Dr. Charisse March- Has appointment set up for tomorrow.Please Advise.

## 2014-07-17 DIAGNOSIS — M79674 Pain in right toe(s): Secondary | ICD-10-CM | POA: Diagnosis not present

## 2014-07-17 DIAGNOSIS — D2371 Other benign neoplasm of skin of right lower limb, including hip: Secondary | ICD-10-CM | POA: Diagnosis not present

## 2014-07-17 DIAGNOSIS — M2041 Other hammer toe(s) (acquired), right foot: Secondary | ICD-10-CM | POA: Diagnosis not present

## 2014-07-17 NOTE — Telephone Encounter (Signed)
Patient already has an appointment and just wanted to let Dr. Nyoka Cowden aware and make sure it was ok with him. Patient Notified.

## 2014-07-17 NOTE — Telephone Encounter (Signed)
Go ahead with podiatry referral.

## 2014-07-26 ENCOUNTER — Telehealth: Payer: Self-pay

## 2014-07-26 DIAGNOSIS — M79674 Pain in right toe(s): Secondary | ICD-10-CM

## 2014-07-26 NOTE — Telephone Encounter (Signed)
Patient aware of Dr.Green's response. Placed referral

## 2014-07-26 NOTE — Telephone Encounter (Signed)
Patient called indicating he was seen at [facility] 12 days ago for plantars wart. Patient states the doctor took a scalpel and wacked at the wart and it caused some excruciating pain, patient is still in pain. Patient denies drainage or discoloration. Patient says area is very tender to the touch and limits his mobility.  Patient would like to know if Dr.Green will follow-up on this himself or refer to a different specialist. Please advise    FYI- ok to leave detailed message on voicemail

## 2014-07-26 NOTE — Telephone Encounter (Signed)
I would suggest a referral to Dr. Janus Molder or one of his partners.

## 2014-07-29 ENCOUNTER — Other Ambulatory Visit: Payer: Self-pay | Admitting: *Deleted

## 2014-07-29 MED ORDER — HYDROCHLOROTHIAZIDE 25 MG PO TABS: ORAL_TABLET | ORAL | Status: DC

## 2014-07-29 NOTE — Telephone Encounter (Signed)
[facility]  

## 2014-07-30 ENCOUNTER — Ambulatory Visit: Payer: Self-pay | Admitting: Internal Medicine

## 2014-08-13 ENCOUNTER — Ambulatory Visit: Payer: Self-pay

## 2014-08-13 ENCOUNTER — Ambulatory Visit (INDEPENDENT_AMBULATORY_CARE_PROVIDER_SITE_OTHER): Payer: Medicare Other

## 2014-08-13 VITALS — BP 178/96 | HR 61 | Resp 16

## 2014-08-13 DIAGNOSIS — M79674 Pain in right toe(s): Secondary | ICD-10-CM

## 2014-08-13 DIAGNOSIS — B070 Plantar wart: Secondary | ICD-10-CM | POA: Diagnosis not present

## 2014-08-13 DIAGNOSIS — M2041 Other hammer toe(s) (acquired), right foot: Secondary | ICD-10-CM | POA: Diagnosis not present

## 2014-08-13 DIAGNOSIS — M158 Other polyosteoarthritis: Secondary | ICD-10-CM | POA: Diagnosis not present

## 2014-08-13 DIAGNOSIS — Q828 Other specified congenital malformations of skin: Secondary | ICD-10-CM | POA: Diagnosis not present

## 2014-08-13 NOTE — Progress Notes (Signed)
   Subjective:    Patient ID: [name] [name]., male    DOB: 10/06/1940, 74 y.o.   MRN: [id number]  HPI  Pt presents with painful callus of 3rd met and plantar aspect of right foot   Review of Systems  All other systems reviewed and are negative.      Objective:   Physical Exam 74 year old white male well-developed well-nourished oriented 3 presents this time with a complaint of the lesion third toe right foot either wart or porokeratotic lesion. Patient presents on referral from his primary physician indicating a cut on it multiple times it keeps recurring. 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 Neurovascular status reveals pedal pulses palpable DP +2 PT plus one over 4 Refill time 3 seconds epicritic and proprioceptive sensations intact and symmetric bilateral there is no plantar response DTRs not elicited neurologically skin color pigment normal hair growth absent nails criptotic incurvated most significant distal clavus keratotic lesion third toe right foot on debridement there is no pinpoint bleed no interruption of skin lines more likely a keratosis porokeratosis versus verruca plantaris likely associated with digital contracture. If he is keratoses subsecond MTP area contractures of all lesser digits are noted bilateral.      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 Follow-up if there is any recurrence or exacerbations.  Harriet Masson DPM

## 2014-08-13 NOTE — Patient Instructions (Signed)
Corns and Calluses Corns are small areas of thickened skin that usually occur on the top, sides, or tip of a toe. They contain a cone-shaped core with a point that can press on a nerve below. This causes pain. Calluses are areas of thickened skin that usually develop on hands, fingers, palms, soles of the feet, and heels. These are areas that experience frequent friction or pressure. CAUSES  Corns are usually the result of rubbing (friction) or pressure from shoes that are too tight or do not fit properly. Calluses are caused by repeated friction and pressure on the affected areas. SYMPTOMS  A hard growth on the skin.  Pain or tenderness under the skin.  Sometimes, redness and swelling.  Increased discomfort while wearing tight-fitting shoes. DIAGNOSIS  Your caregiver can usually tell what the problem is by doing a physical exam. TREATMENT  Removing the cause of the friction or pressure is usually the only treatment needed. However, sometimes medicines can be used to help soften the hardened, thickened areas. These medicines include salicylic acid plasters and 12% ammonium lactate lotion. These medicines should only be used under the direction of your caregiver. HOME CARE INSTRUCTIONS   Try to remove pressure from the affected area.  You may wear donut-shaped corn pads to protect your skin.  You may use a pumice stone or nonmetallic nail file to gently reduce the thickness of a corn.  Wear properly fitted footwear.  If you have calluses on the hands, wear gloves during activities that cause friction.  If you have diabetes, you should regularly examine your feet. Tell your caregiver if you notice any problems with your feet. SEEK IMMEDIATE MEDICAL CARE IF:   You have increased pain, swelling, redness, or warmth in the affected area.  Your corn or callus starts to drain fluid or bleeds.  You are not getting better, even with treatment. Document Released: 02/21/2004 Document  Revised: 08/09/2011 Document Reviewed: 01/12/2011 [facility] Patient Information 2015 [place], [place]. This information is not intended to replace advice given to you by your health care provider. Make sure you discuss any questions you have with your health care provider.    Maintain the crest pad on your toes whenever walking or with enclosed shoes and socks. If symptoms persist or the lesion continues to recur surgery for hammertoe repair may be consideration.

## 2014-08-20 ENCOUNTER — Other Ambulatory Visit: Payer: Self-pay | Admitting: *Deleted

## 2014-08-20 DIAGNOSIS — M12811 Other specific arthropathies, not elsewhere classified, right shoulder: Secondary | ICD-10-CM

## 2014-08-20 DIAGNOSIS — M75101 Unspecified rotator cuff tear or rupture of right shoulder, not specified as traumatic: Principal | ICD-10-CM

## 2014-08-20 DIAGNOSIS — R112 Nausea with vomiting, unspecified: Secondary | ICD-10-CM

## 2014-08-20 MED ORDER — ONDANSETRON HCL 4 MG PO TABS: ORAL_TABLET | ORAL | Status: DC

## 2014-08-20 MED ORDER — OXYCODONE HCL 30 MG PO TABS
ORAL_TABLET | ORAL | Status: DC
Start: 2014-08-20 — End: 2014-10-01

## 2014-08-20 NOTE — Telephone Encounter (Signed)
Patient wife, Hassan Rowan called and requested Rx's to pick up

## 2014-10-01 ENCOUNTER — Other Ambulatory Visit: Payer: Self-pay | Admitting: *Deleted

## 2014-10-01 DIAGNOSIS — M12811 Other specific arthropathies, not elsewhere classified, right shoulder: Secondary | ICD-10-CM

## 2014-10-01 DIAGNOSIS — M75101 Unspecified rotator cuff tear or rupture of right shoulder, not specified as traumatic: Principal | ICD-10-CM

## 2014-10-01 MED ORDER — OXYCODONE HCL 30 MG PO TABS: ORAL_TABLET | ORAL | Status: DC

## 2014-10-01 NOTE — Telephone Encounter (Signed)
Patient requested and picked up

## 2014-10-02 ENCOUNTER — Ambulatory Visit: Payer: Medicare Other | Admitting: Internal Medicine

## 2014-10-04 ENCOUNTER — Encounter: Payer: Self-pay | Admitting: Internal Medicine

## 2014-10-04 ENCOUNTER — Ambulatory Visit (INDEPENDENT_AMBULATORY_CARE_PROVIDER_SITE_OTHER): Payer: Medicare Other | Admitting: Internal Medicine

## 2014-10-04 VITALS — BP 124/82 | HR 53 | Temp 98.3°F | Ht 75.0 in | Wt 254.0 lb

## 2014-10-04 DIAGNOSIS — K5909 Other constipation: Secondary | ICD-10-CM

## 2014-10-04 DIAGNOSIS — K56600 Partial intestinal obstruction, unspecified as to cause: Secondary | ICD-10-CM

## 2014-10-04 DIAGNOSIS — K5903 Drug induced constipation: Secondary | ICD-10-CM

## 2014-10-04 DIAGNOSIS — K5669 Other intestinal obstruction: Secondary | ICD-10-CM | POA: Diagnosis not present

## 2014-10-04 DIAGNOSIS — M62830 Muscle spasm of back: Secondary | ICD-10-CM

## 2014-10-04 DIAGNOSIS — T402X5A Adverse effect of other opioids, initial encounter: Secondary | ICD-10-CM

## 2014-10-04 NOTE — Progress Notes (Signed)
Patient ID: [name] [name]., male   DOB: 01/14/1941, 74 y.o.   MRN: [id number]   Location:  [facility]  Allergies  Allergen Reactions  . Amitriptyline   . Azithromycin     Stomach pain  . Flexeril [Cyclobenzaprine]     Causes dizziness    Chief Complaint  Patient presents with  . Acute Visit    " I dont feel good." x 4-5 days patient with nausea and sick to stomach. No bowel movement since Sat, patient states he feels so full and packed inside.     HPI: Patient is a 73 y.o.  seen in the office today for an acute visit.  He sees Dr. Nyoka Cowden and is not happy to see someone else.    Not feeling well for 4-5 days.  Has history of stomach and digestive disorders for several years.  Had c diff.  Ate BBQ that didn't taste right on Saturday--left half of it.  Started to have abdominal pain.  Sun, had [place] sausage and meatballs at [employer] worse.  Bowels didn't move sat, sun, mon, tues.  Started to feel heavy and very nauseous.  Took zofran w/o benefit.  On wed night, felt really nauseous and terrible--did enema three times inserting water into the rectum.  Some effect, but took a bit, then on the commode for a long time.  Hurt his back reaching around to his rectum.  Laid down.  Couldn't get out of bed the next morning...eventually he did.  Put heat on his back at first, but says ice usually works better so switched to that.  Still has residual nausea.  Only had chicken soup and gatorade yesterday.  Feels ok, plans to put ice on back to keep it getting better.  Does take MOM on regular basis and knows he doesn't eat enough fiber.  No solid food in 30 hrs and now afraid to eat.    Normally, has bm daily.  Sometimes easy, sometimes must strain.  Sometimes not a complete evacuation.  When reduces his calories to 1500, he cannot go.  Does do better if drinks more water...cannot drink 10 glasses of water per day.  Squirts lemon juice in  there.  Discussed small snacks.    Mentions high amounts of stress in his life, and he has difficulty coping.  Does not drink or take medications for this.  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  Therefore, he doesn't wish to go.    Does not use one oxycodone every three hours much of the time with his torn rotator cuff.    Did have testing for gastroparesis and was normal. Stool softeners have not been ineffective.   Discussed probiotics when he asked about them.   Had a lot of loose bms initially and then one large one "the size of a soccer ball".    Review of Systems:  Review of Systems  Constitutional: Negative for fever and chills.  Gastrointestinal: Positive for nausea, abdominal pain and constipation. Negative for vomiting, diarrhea, blood in stool and melena.  Genitourinary: Negative for dysuria.  Musculoskeletal: Positive for myalgias and back pain.  Psychiatric/Behavioral:       Stressed     Past Medical History  Diagnosis Date  . HTN (hypertension)   . Partial deafness   . Clostridium difficile colitis   . GERD (gastroesophageal reflux disease)   . Allergy   . Hx of echocardiogram  a. Echo 11/12:  Mod LVH, EF 55-60%, MAC, mod LAE, mild RAE  . Depression   . Atrial flutter     a. dx after inguinal hernia repair in 10/12 => seen by Dr. Acie Fredrickson  . Arthritis     "right shoulder" (05/03/2012)  . Kidney stone     "they just passed" (05/03/2012)  . Allergic rhinitis due to pollen   . Pain in joint, pelvic region and thigh   . Complications affecting other specified body systems, hypertension   . Other specified cardiac dysrhythmias(427.89)   . Rash and other nonspecific skin eruption   . Sacroiliitis, not elsewhere classified   . Obesity, unspecified   . Inguinal hernia without mention of obstruction or gangrene, unilateral or unspecified, (not specified as recurrent)   . Intestinal infection due to Clostridium difficile   . Irritable bowel  syndrome   . Special screening for malignant neoplasm of prostate   . Unspecified hereditary and idiopathic peripheral neuropathy   . Sacroiliitis, not elsewhere classified   . Spasm of muscle   . Cervicalgia   . Degeneration of cervical intervertebral disc   . Pain in joint, ankle and foot   . Cervicalgia   . Pain in joint, shoulder region   . Nonspecific abnormal electrocardiogram (ECG) (EKG)   . Calculus of kidney   . Depressive disorder, not elsewhere classified   . Unspecified constipation   . Pain in joint, lower leg   . Anxiety state, unspecified   . Other malaise and fatigue   . Insomnia, unspecified   . Lumbago   . Coronary atherosclerosis of unspecified type of vessel, native or graft   . Nonspecific abnormal results of pulmonary system function study   . Impotence of organic origin   . Osteoarthrosis, unspecified whether generalized or localized, unspecified site     Past Surgical History  Procedure Laterality Date  . Appendectomy  ~ 1956  . Tonsillectomy  ~ 1946  . Renal cyst excision      pt denies this hx on 05/03/2012  . Colonoscopy  06/06/2008    severe sigmoid diverticulosis and internal hemorrhoids  . Esophagogastroduodenoscopy  06/06/2008    normal  . Posterior lumbar fusion  1989  . Total hip arthroplasty  1999-2000    bilateral  . Carpal tunnel with cubital tunnel  2004    "right" (05/03/2012)  . Cardiac catheterization  08/1999; 05/03/2012    normal coronary arteries;   . Inguinal hernia repair  03/23/11    "left" (05/03/2012)  . Spine surgery  1969    Pantopaque Myelography and spinal infusion- Dr. Lyman Speller  . Reconstruction (r) foot surgery      DR SUE   . Right knee  [place]  . Atrial flutter ablation N/A 05/03/2012    Procedure: ATRIAL FLUTTER ABLATION;  Surgeon: Evans Lance, MD;  Location: [facility] C[facility] LAB;  Service: Cardiovascular;  Laterality: N/A;    Social History:   reports that he has quit smoking. His smoking use included Cigarettes. He has  a 10 pack-year smoking history. He has never used smokeless tobacco. He reports that he drinks alcohol. He reports that he does not use illicit drugs.  Family History  Problem Relation Age of Onset  . Colon cancer Neg Hx   . Hypertension Father     Medications: Patient's Medications  New Prescriptions   No medications on file  Previous Medications   ASPIRIN EC 81 MG TABLET    Take 1  tablet (81 mg total) by mouth daily.   HYDROCHLOROTHIAZIDE (HYDRODIURIL) 25 MG TABLET    Take 1/2 tablet by mouth twice daily for blood pressure   HYDROCODONE-IBUPROFEN (VICOPROFEN) 7.5-200 MG PER TABLET       LOSARTAN-HYDROCHLOROTHIAZIDE (HYZAAR) 100-25 MG PER TABLET    Take one tablet by mouth once daily to control blood pressure   NEXIUM 40 MG CAPSULE       ONDANSETRON (ZOFRAN) 4 MG TABLET    One every 8 hous if needed for nausea.   OXYCODONE (ROXICODONE) 30 MG IMMEDIATE RELEASE TABLET    One every 3 hours if needed to control pain   VITAMIN C (ASCORBIC ACID) 500 MG TABLET    Take 2,000 mg by mouth daily.   Modified Medications   No medications on file  Discontinued Medications   CHLORPHEN-PHENYLEPH-IBUPROFEN 4-10-200 MG TABS    One up to three times daily to help congestion     Physical Exam: Filed Vitals:   10/04/14 0811  BP: 124/82  Pulse: 53  Temp: 98.3 F (36.8 C)  TempSrc: Oral  Height: 6\' 3"  (1.905 m)  Weight: 254 lb (115.214 kg)  SpO2: 97%  Physical Exam  Constitutional: He is oriented to person, place, and time.  Cardiovascular: Normal rate, regular rhythm and normal heart sounds.   Pulmonary/Chest: Effort normal and breath sounds normal.  Abdominal: Soft. Bowel sounds are normal. He exhibits no distension and no mass. There is no tenderness. There is no rebound and no guarding.  Musculoskeletal: He exhibits tenderness.  Right lumbar paravertebral muscles and sacroiliac joint area  Neurological: He is alert and oriented to person, place, and time.    Labs reviewed: Basic  Metabolic Panel:  Recent Labs  10/18/13 0912  NA 142  K 4.9  CL 103  CO2 27  GLUCOSE 94  BUN 18  CREATININE 1.23  CALCIUM 9.6   Liver Function Tests:  Recent Labs  10/18/13 0912  AST 44*  ALT 35  ALKPHOS 69  BILITOT 0.7  PROT 5.8*  CBC:  Recent Labs  10/18/13 0912  WBC 4.1  NEUTROABS 2.4  HGB 13.3  HCT 38.3  MCV 91   Lab Results  Component Value Date   HGBA1C * 08/10/2010    5.8 (NOTE)                                                                       According to the ADA Clinical Practice Recommendations for 2011, when HbA1c is used as a screening test:   >=6.5%   Diagnostic of Diabetes Mellitus           (if abnormal result  is confirmed)  5.7-6.4%   Increased risk of developing Diabetes Mellitus  References:Diagnosis and Classification of Diabetes Mellitus,Diabetes GEXB,2841,32(GMWNU 1):S62-S69 and Standards of Medical Care in         Diabetes - 2011,Diabetes Care,2011,34  (Suppl 1):S11-S61.   Assessment/Plan 1. Small bowel obstruction, partial - I suspect he had a small bowel obstruction that resolved after his 3 enemas -we spent the visit discussing approaches to preventing this by being sure to treat his constipation after 2 days w/o bms--he prefers to use milk of mag as needed or juices to help with this (  2 apples and a pear juiced) -also encouraged increased physical activity and small healthy snacks to keep his metabolism moving, plenty of water as per instructions -also decided to try align daily to see if that would help  2. Therapeutic opioid induced constipation -has tried stool softeners w/o benefit in the past  3. Lumbar paraspinal muscle spasm -advised ice at first--if pain persists more than a week, switch to heat -use only for 20 mins in a day up to 5x per day  Labs/tests ordered: none today Next appt:  Wanted appt with Dr. Nyoka Cowden next week anyway  Jorden Minchey L. Zorion Nims, D.O. [facility] [address], [place] [address] Cell Phone (Mon-Fri 8am-5pm):  [phone] On Call:  [phone] & follow prompts after 5pm & weekends Office Phone:  [phone] Office Fax:  [phone]

## 2014-10-04 NOTE — Patient Instructions (Signed)
Drink 6-8 8oz glasses of water daily. You may use juices to help your bms. Take MOM if you do not have a BM for 2 days. Increase activity to keep bowels moving. Try Align daily and see if that doesn't help you have more regular BMs.

## 2014-10-08 ENCOUNTER — Ambulatory Visit: Payer: Self-pay | Admitting: Internal Medicine

## 2014-11-07 ENCOUNTER — Other Ambulatory Visit: Payer: Self-pay

## 2014-11-07 DIAGNOSIS — M12811 Other specific arthropathies, not elsewhere classified, right shoulder: Secondary | ICD-10-CM

## 2014-11-07 DIAGNOSIS — M75101 Unspecified rotator cuff tear or rupture of right shoulder, not specified as traumatic: Principal | ICD-10-CM

## 2014-11-07 MED ORDER — OXYCODONE HCL 30 MG PO TABS: ORAL_TABLET | ORAL | Status: DC

## 2014-11-07 NOTE — Telephone Encounter (Signed)
Message left on triage voicemail, need rx for oxycodone.  I left message on voicemail informing patient message was received. RX printed and placed up front for pick-up

## 2014-11-11 ENCOUNTER — Telehealth: Payer: Self-pay | Admitting: *Deleted

## 2014-11-11 NOTE — Telephone Encounter (Signed)
He takes the oxycodone one every three hours as needed for pain.  Hydrocodone can also be one every three hours as needed.  This will give him 2600mg  of tylenol per day.  He cannot take more than 3000mg  per day safely.  Please make sure he is aware of that.

## 2014-11-11 NOTE — Telephone Encounter (Signed)
Patient called and stated that he meant to ask for the Hydrocodone last week but asked for Oxycodone instead. Wants [place] to keep the Oxycodone Rx and give him Hydrocodone because he stated he thinks he does better with Hydrocodone. He thinks the Oxycodone is too strong for him. Hydrocodone is on his list but no directions and hasn't gotten refilled in awhile. He states that he switches back and forths between the two. Please Advise.

## 2014-11-12 MED ORDER — HYDROCODONE-IBUPROFEN 7.5-200 MG PO TABS: ORAL_TABLET | ORAL | Status: DC

## 2014-11-12 NOTE — Telephone Encounter (Signed)
Patient does not want the Oxycodone Rx at this time, only the Hydrocodone. Hydrocodone printed.

## 2014-11-19 DIAGNOSIS — I83813 Varicose veins of bilateral lower extremities with pain: Secondary | ICD-10-CM | POA: Diagnosis not present

## 2014-11-19 DIAGNOSIS — I8311 Varicose veins of right lower extremity with inflammation: Secondary | ICD-10-CM | POA: Diagnosis not present

## 2014-11-19 DIAGNOSIS — I8312 Varicose veins of left lower extremity with inflammation: Secondary | ICD-10-CM | POA: Diagnosis not present

## 2014-11-20 ENCOUNTER — Ambulatory Visit (INDEPENDENT_AMBULATORY_CARE_PROVIDER_SITE_OTHER): Payer: Medicare Other | Admitting: Internal Medicine

## 2014-11-20 ENCOUNTER — Encounter: Payer: Self-pay | Admitting: Internal Medicine

## 2014-11-20 VITALS — BP 160/84 | HR 61 | Temp 97.7°F | Ht 75.0 in | Wt 260.8 lb

## 2014-11-20 DIAGNOSIS — E669 Obesity, unspecified: Secondary | ICD-10-CM

## 2014-11-20 DIAGNOSIS — K5909 Other constipation: Secondary | ICD-10-CM

## 2014-11-20 DIAGNOSIS — F329 Major depressive disorder, single episode, unspecified: Secondary | ICD-10-CM | POA: Diagnosis not present

## 2014-11-20 DIAGNOSIS — K5900 Constipation, unspecified: Secondary | ICD-10-CM | POA: Diagnosis not present

## 2014-11-20 DIAGNOSIS — I10 Essential (primary) hypertension: Secondary | ICD-10-CM | POA: Diagnosis not present

## 2014-11-20 DIAGNOSIS — F32A Depression, unspecified: Secondary | ICD-10-CM

## 2014-11-20 NOTE — Progress Notes (Signed)
Patient ID: [name] Camp., male   DOB: April 26, 1941, 74 y.o.   MRN: [id number]    Facility  [place]    Place of Service:   OFFICE    Allergies  Allergen Reactions  . Amitriptyline   . Azithromycin     Stomach pain  . Flexeril [Cyclobenzaprine]     Causes dizziness    Chief Complaint  Patient presents with  . Medical Management of Chronic Issues    4 Month follow up    HPI:  Depression: Ongoing and increased stress, related to financial difficulties, work stress, and caregiving for his wife. Feels hopeless about his situation ever improving. Hard to watch and care for his wife who is suffering from constant pain, fibromyalgia and depression herself. Mourns the loss of their prior relationship, "the way things used to be." Endorses depression. Denies SI.  Essential hypertension: elevated today at 160/84. No chest pains or pedal edema.  Obesity: Weight up 6# since May of this year. Steadily increasing over last 2 years. Has gained 25# in last year. Relates poor eating and lack of exercise to increased stress and binge eating  Chronic constipation: Completed 2 week course of Align and has been having regular soft BMs since. Feels well.    Medications: Patient's Medications  New Prescriptions   No medications on file  Previous Medications   ASPIRIN EC 81 MG TABLET    Take 1 tablet (81 mg total) by mouth daily.   HYDROCHLOROTHIAZIDE (HYDRODIURIL) 25 MG TABLET    Take 1/2 tablet by mouth twice daily for blood pressure   HYDROCODONE-IBUPROFEN (VICOPROFEN) 7.5-200 MG PER TABLET    Take one tablet by mouth up to four times daily as needed for pain.   LOSARTAN-HYDROCHLOROTHIAZIDE (HYZAAR) 100-25 MG PER TABLET    Take one tablet by mouth once daily to control blood pressure   NEXIUM 40 MG CAPSULE       ONDANSETRON (ZOFRAN) 4 MG TABLET    One every 8 hous if needed for nausea.   OXYCODONE (ROXICODONE) 30 MG IMMEDIATE RELEASE TABLET    One every 3 hours if needed to control pain   VITAMIN C (ASCORBIC ACID) 500 MG TABLET    Take 2,000 mg by mouth daily.   Modified Medications   No medications on file  Discontinued Medications   No medications on file     Review of Systems  Constitutional: Positive for appetite change and fatigue. Negative for fever and chills.  HENT: Positive for hearing loss and sinus pressure. Negative for ear pain, trouble swallowing and voice change.   Eyes: Negative.   Cardiovascular: Negative.        Patient has hypertension.   Gastrointestinal: Negative for diarrhea, constipation, blood in stool and abdominal distention.  Endocrine: Negative.   Genitourinary: Negative.   Musculoskeletal:       Resolved shoulder pains.  Skin:       Painless atrophic plaque of the left forearm.  Neurological: Negative.   Hematological: Negative.   Psychiatric/Behavioral: Positive for dysphoric mood. Negative for suicidal ideas and self-injury. The patient is nervous/anxious.     Filed Vitals:   11/20/14 1216  BP: 160/84  Pulse: 61  Temp: 97.7 F (36.5 C)  TempSrc: Oral  Height: 6\' 3"  (1.905 m)  Weight: 260 lb 12.8 oz (118.298 kg)   Body mass index is 32.6 kg/(m^2).  Physical Exam  Constitutional: He is oriented to person, place, and time. He appears well-developed and well-nourished. No distress.  Moderately overweight  HENT:  Head: Normocephalic and atraumatic.  Significant partial deafness bilaterally.  Eyes: Conjunctivae and EOM are normal. Pupils are equal, round, and reactive to light.  Neck: Normal range of motion. Neck supple. No JVD present. No tracheal deviation present. No thyromegaly present.  Cardiovascular: Normal rate, regular rhythm, normal heart sounds and intact distal pulses.  Exam reveals no gallop and no friction rub.   No murmur heard. Pulmonary/Chest: Effort normal and breath sounds normal. No respiratory distress. He has no wheezes. He has no rales. He exhibits no tenderness.  Abdominal: Soft. Bowel sounds are normal.  He exhibits no distension and no mass. There is no tenderness.  Musculoskeletal: He exhibits no edema or tenderness.  Hammer toe right 3rd.  Lymphadenopathy:    He has no cervical adenopathy.  Neurological: He is alert and oriented to person, place, and time. No cranial nerve deficit.  Skin: Skin is dry. No rash noted. No erythema. No pallor.  Atrophic plaque of the left forearm.  Psychiatric: His speech is normal and behavior is normal. Judgment and thought content normal. Cognition and memory are normal. He exhibits a depressed mood.     Labs reviewed: No visits with results within 3 Month(s) from this visit. Latest known visit with results is:  Appointment on 10/18/2013  Component Date Value Ref Range Status  . WBC 10/18/2013 4.1  3.4 - 10.8 x10E3/uL Final  . RBC 10/18/2013 4.22  4.14 - 5.80 x10E6/uL Final  . Hemoglobin 10/18/2013 13.3  12.6 - 17.7 g/dL Final  . HCT 10/18/2013 38.3  37.5 - 51.0 % Final  . MCV 10/18/2013 91  79 - 97 fL Final  . MCH 10/18/2013 31.5  26.6 - 33.0 pg Final  . MCHC 10/18/2013 34.7  31.5 - 35.7 g/dL Final  . RDW 10/18/2013 14.1  12.3 - 15.4 % Final  . Neutrophils Relative % 10/18/2013 60   Final  . Lymphs 10/18/2013 25   Final  . Monocytes 10/18/2013 11   Final  . Eos 10/18/2013 3   Final  . Basos 10/18/2013 1   Final  . Neutrophils Absolute 10/18/2013 2.4  1.4 - 7.0 x10E3/uL Final  . Lymphocytes Absolute 10/18/2013 1.0  0.7 - 3.1 x10E3/uL Final  . Monocytes Absolute 10/18/2013 0.4  0.1 - 0.9 x10E3/uL Final  . Eosinophils Absolute 10/18/2013 0.1  0.0 - 0.4 x10E3/uL Final  . Basophils Absolute 10/18/2013 0.1  0.0 - 0.2 x10E3/uL Final  . Immature Granulocytes 10/18/2013 0   Final  . Immature Grans (Abs) 10/18/2013 0.0  0.0 - 0.1 x10E3/uL Final  . Glucose 10/18/2013 94  65 - 99 mg/dL Final  . BUN 10/18/2013 18  8 - 27 mg/dL Final  . Creatinine, Ser 10/18/2013 1.23  0.76 - 1.27 mg/dL Final  . GFR calc non Af Amer 10/18/2013 58* >59 mL/min/1.73 Final    . GFR calc Af Amer 10/18/2013 67  >59 mL/min/1.73 Final  . BUN/Creatinine Ratio 10/18/2013 15  10 - 22 Final  . Sodium 10/18/2013 142  134 - 144 mmol/L Final  . Potassium 10/18/2013 4.9  3.5 - 5.2 mmol/L Final  . Chloride 10/18/2013 103  97 - 108 mmol/L Final  . CO2 10/18/2013 27  18 - 29 mmol/L Final  . Calcium 10/18/2013 9.6  8.6 - 10.2 mg/dL Final  . Total Protein 10/18/2013 5.8* 6.0 - 8.5 g/dL Final  . Albumin 10/18/2013 4.2  3.5 - 4.8 g/dL Final  . Globulin, Total 10/18/2013 1.6  1.5 -  4.5 g/dL Final  . Albumin/Globulin Ratio 10/18/2013 2.6* 1.1 - 2.5 Final  . Total Bilirubin 10/18/2013 0.7  0.0 - 1.2 mg/dL Final  . Alkaline Phosphatase 10/18/2013 69  39 - 117 IU/L Final  . AST 10/18/2013 44* 0 - 40 IU/L Final  . ALT 10/18/2013 35  0 - 44 IU/L Final     Assessment/Plan 1. Depression - counseling offered, declined - also declined pharmacotherapy - will attempt to increase physical activity  2. Essential hypertension - mildly elevated today, elevated on recheck as well. He is clearly distressed and quite tense during the course of this visit. We will watch his blood pressure and recheck it when he returns in 3 weeks. No changes in medication today.  3. Obesity - To increase activity, going to start going back to gym - Will attempt to make dietary changes, find stress relief other than eating for comfort  4. Chronic constipation - Currently resolved

## 2014-11-20 NOTE — Patient Instructions (Signed)
Start walking and exercising regularly.

## 2014-11-25 ENCOUNTER — Other Ambulatory Visit: Payer: Self-pay

## 2014-11-25 DIAGNOSIS — M47816 Spondylosis without myelopathy or radiculopathy, lumbar region: Secondary | ICD-10-CM | POA: Diagnosis not present

## 2014-11-25 DIAGNOSIS — M9903 Segmental and somatic dysfunction of lumbar region: Secondary | ICD-10-CM | POA: Diagnosis not present

## 2014-11-26 DIAGNOSIS — M47816 Spondylosis without myelopathy or radiculopathy, lumbar region: Secondary | ICD-10-CM | POA: Diagnosis not present

## 2014-11-26 DIAGNOSIS — M9903 Segmental and somatic dysfunction of lumbar region: Secondary | ICD-10-CM | POA: Diagnosis not present

## 2014-11-27 ENCOUNTER — Encounter: Payer: Medicare Other | Admitting: Internal Medicine

## 2014-11-27 DIAGNOSIS — M9903 Segmental and somatic dysfunction of lumbar region: Secondary | ICD-10-CM | POA: Diagnosis not present

## 2014-11-27 DIAGNOSIS — M47816 Spondylosis without myelopathy or radiculopathy, lumbar region: Secondary | ICD-10-CM | POA: Diagnosis not present

## 2014-11-28 DIAGNOSIS — M47816 Spondylosis without myelopathy or radiculopathy, lumbar region: Secondary | ICD-10-CM | POA: Diagnosis not present

## 2014-11-28 DIAGNOSIS — M9903 Segmental and somatic dysfunction of lumbar region: Secondary | ICD-10-CM | POA: Diagnosis not present

## 2014-11-30 ENCOUNTER — Other Ambulatory Visit: Payer: Self-pay | Admitting: Internal Medicine

## 2014-12-04 ENCOUNTER — Ambulatory Visit (INDEPENDENT_AMBULATORY_CARE_PROVIDER_SITE_OTHER): Payer: Medicare Other | Admitting: Internal Medicine

## 2014-12-04 ENCOUNTER — Encounter: Payer: Self-pay | Admitting: Internal Medicine

## 2014-12-04 VITALS — BP 142/98 | HR 62 | Temp 98.2°F | Resp 20 | Ht 75.0 in | Wt 261.2 lb

## 2014-12-04 DIAGNOSIS — A0472 Enterocolitis due to Clostridium difficile, not specified as recurrent: Secondary | ICD-10-CM

## 2014-12-04 DIAGNOSIS — E669 Obesity, unspecified: Secondary | ICD-10-CM

## 2014-12-04 DIAGNOSIS — M79672 Pain in left foot: Secondary | ICD-10-CM

## 2014-12-04 DIAGNOSIS — A047 Enterocolitis due to Clostridium difficile: Secondary | ICD-10-CM | POA: Diagnosis not present

## 2014-12-04 DIAGNOSIS — N182 Chronic kidney disease, stage 2 (mild): Secondary | ICD-10-CM | POA: Diagnosis not present

## 2014-12-04 DIAGNOSIS — I10 Essential (primary) hypertension: Secondary | ICD-10-CM

## 2014-12-04 DIAGNOSIS — M25519 Pain in unspecified shoulder: Secondary | ICD-10-CM | POA: Diagnosis not present

## 2014-12-04 NOTE — Progress Notes (Signed)
Patient ID: Bubba Camp., male   DOB: 05/29/1941, 74 y.o.   MRN: [id number]    HISTORY AND PHYSICAL  Location:       Place of Service:     Extended Emergency Contact Information Primary Emergency Contact: Giannini,Brenda Address: [address]          [place], [place] [address] [place] Phone: [phone] Mobile Phone: [phone] Relation: Spouse  Advanced Directive information  Full code  Chief Complaint  Patient presents with  . Annual Exam    Annual exam    HPI:  Essential hypertension: Mild elevation that should improve with weight loss.  Obesity: Discussed diet and activity levels with patient. It is imperative for him to begin to lose weight. He is willing to try.  CKD (chronic kidney disease), stage 2 (mild): Has not been checked in a year. BUN and creatinine were normal in 2015.  Pain in left foot: Resolved  Pain in joint, shoulder region, unspecified laterality: Bilateral shoulder planes. Patient is now seeing a chiropractor for shoulders and lower back. He says he is starting to feel better with the manipulations he has had.  Enteritis due to Clostridium difficile: Resolved and no longer having loose stools. Bowels move every day with formed content. Last episode started in 2015.    Past Medical History  Diagnosis Date  . HTN (hypertension)   . Partial deafness   . Clostridium difficile colitis   . GERD (gastroesophageal reflux disease)   . Allergy   . Hx of echocardiogram     a. Echo 11/12:  Mod LVH, EF 55-60%, MAC, mod LAE, mild RAE  . Depression   . Atrial flutter     a. dx after inguinal hernia repair in 10/12 => seen by Dr. Acie Fredrickson  . Arthritis     "right shoulder" (05/03/2012)  . Kidney stone     "they just passed" (05/03/2012)  . Allergic rhinitis due to pollen   . Pain in joint, pelvic region and thigh   . Complications affecting other specified body systems, hypertension   . Other specified cardiac  dysrhythmias(427.89)   . Rash and other nonspecific skin eruption   . Sacroiliitis, not elsewhere classified   . Obesity, unspecified   . Inguinal hernia without mention of obstruction or gangrene, unilateral or unspecified, (not specified as recurrent)   . Intestinal infection due to Clostridium difficile   . Irritable bowel syndrome   . Special screening for malignant neoplasm of prostate   . Unspecified hereditary and idiopathic peripheral neuropathy   . Sacroiliitis, not elsewhere classified   . Spasm of muscle   . Cervicalgia   . Degeneration of cervical intervertebral disc   . Pain in joint, ankle and foot   . Cervicalgia   . Pain in joint, shoulder region   . Nonspecific abnormal electrocardiogram (ECG) (EKG)   . Calculus of kidney   . Depressive disorder, not elsewhere classified   . Unspecified constipation   . Pain in joint, lower leg   . Anxiety state, unspecified   . Other malaise and fatigue   . Insomnia, unspecified   . Lumbago   . Coronary atherosclerosis of unspecified type of vessel, native or graft   . Nonspecific abnormal results of pulmonary system function study   . Impotence of organic origin   . Osteoarthrosis, unspecified whether generalized or localized, unspecified site     Past Surgical History  Procedure Laterality Date  . Appendectomy  ~  1956  . Tonsillectomy  ~ 1946  . Renal cyst excision      pt denies this hx on 05/03/2012  . Colonoscopy  06/06/2008    severe sigmoid diverticulosis and internal hemorrhoids  . Esophagogastroduodenoscopy  06/06/2008    normal  . Posterior lumbar fusion  1989  . Total hip arthroplasty  1999-2000    bilateral  . Carpal tunnel with cubital tunnel  2004    "right" (05/03/2012)  . Cardiac catheterization  08/1999; 05/03/2012    normal coronary arteries;   . Inguinal hernia repair  03/23/11    "left" (05/03/2012)  . Spine surgery  1969    Pantopaque Myelography and spinal infusion- Dr. Lyman Speller  . Reconstruction (r) foot  surgery      DR SUE   . Right knee  [place]  . Atrial flutter ablation N/A 05/03/2012    Procedure: ATRIAL FLUTTER ABLATION;  Surgeon: Evans Lance, MD;  Location: [facility] CATH LAB;  Service: Cardiovascular;  Laterality: N/A;    Patient Care Team: Estill Dooms, MD as PCP - General (Internal Medicine)  History   Social History  . Marital Status: Married    Spouse Name: N/A  . Number of Children: N/A  . Years of Education: N/A   Occupational History  . Not on file.   Social History Main Topics  . Smoking status: Former Smoker -- 2.00 packs/day for 5 years    Types: Cigarettes  . Smokeless tobacco: Never Used     Comment: 05/03/2012 "smoked from age 12 to 26"  . Alcohol Use: 0.0 oz/week    0 Standard drinks or equivalent per week  . Drug Use: No  . Sexual Activity:    Partners: Female   Other Topics Concern  . Not on file   Social History Narrative   ** Merged History Encounter **         reports that he has quit smoking. His smoking use included Cigarettes. He has a 10 pack-year smoking history. He has never used smokeless tobacco. He reports that he drinks alcohol. He reports that he does not use illicit drugs.  Family History  Problem Relation Age of Onset  . Colon cancer Neg Hx   . Hypertension Father    Family Status  Relation Status Death Age  . Father Deceased 57    Cause of Death: Pneumonia  . Mother Deceased 58    Cause of Death: Hepatitis  . Sister Alive   . Daughter Alive   . Son Alive   . Son Alive     Immunization History  Administered Date(s) Administered  . Influenza,inj,Quad PF,36+ Mos 02/20/2014  . Influenza-Unspecified 02/24/2010, 04/02/2011, 02/02/2012, 01/29/2013  . Pneumococcal Polysaccharide-23 05/27/2009  . Tdap 06/02/2011  . Zoster 06/01/2011    Allergies  Allergen Reactions  . Amitriptyline   . Azithromycin     Stomach pain  . Flexeril [Cyclobenzaprine]     Causes dizziness    Medications: Patient's Medications  New  Prescriptions   No medications on file  Previous Medications   ASPIRIN EC 81 MG TABLET    Take 1 tablet (81 mg total) by mouth daily.   HYDROCHLOROTHIAZIDE (HYDRODIURIL) 25 MG TABLET    Take 1/2 tablet by mouth twice daily for blood pressure   HYDROCODONE-IBUPROFEN (VICOPROFEN) 7.5-200 MG PER TABLET    Take one tablet by mouth up to four times daily as needed for pain.   LOSARTAN-HYDROCHLOROTHIAZIDE (HYZAAR) 100-25 MG PER TABLET  Take one tablet by mouth once daily to control blood pressure   NEXIUM 40 MG CAPSULE       ONDANSETRON (ZOFRAN) 4 MG TABLET    TAKE 1 TABLET BY MOUTH EVERY 8 HOURS IF NEEDED FOR NAUSEA   OXYCODONE (ROXICODONE) 30 MG IMMEDIATE RELEASE TABLET    One every 3 hours if needed to control pain   VITAMIN C (ASCORBIC ACID) 500 MG TABLET    Take 2,000 mg by mouth daily.   Modified Medications   No medications on file  Discontinued Medications   No medications on file    Review of Systems  Constitutional: Positive for appetite change and fatigue. Negative for fever and chills.  HENT: Positive for hearing loss and sinus pressure. Negative for ear pain, trouble swallowing and voice change.   Eyes: Negative.   Cardiovascular: Negative.        Patient has hypertension.   Gastrointestinal: Negative for diarrhea, constipation, blood in stool and abdominal distention.  Endocrine: Negative.   Genitourinary: Negative.   Musculoskeletal:       Resolved shoulder pains.  Skin:       Painless atrophic plaque of the left forearm.  Neurological: Negative.   Hematological: Negative.   Psychiatric/Behavioral: Positive for dysphoric mood. Negative for suicidal ideas and self-injury. The patient is nervous/anxious.     Filed Vitals:   12/04/14 1322  BP: 142/98  Pulse: 62  Temp: 98.2 F (36.8 C)  TempSrc: Oral  Resp: 20  Height: 6\' 3"  (1.905 m)  Weight: 261 lb 3.2 oz (118.48 kg)  SpO2: 97%   Body mass index is 32.65 kg/(m^2).  Physical Exam  Constitutional: He is  oriented to person, place, and time. He appears well-developed and well-nourished. No distress.  Moderately overweight  HENT:  Head: Normocephalic and atraumatic.  Significant partial deafness bilaterally.  Eyes: Conjunctivae and EOM are normal. Pupils are equal, round, and reactive to light.  Neck: Normal range of motion. Neck supple. No JVD present. No tracheal deviation present. No thyromegaly present.  Cardiovascular: Normal rate, regular rhythm, normal heart sounds and intact distal pulses.  Exam reveals no gallop and no friction rub.   No murmur heard. Pulmonary/Chest: Effort normal and breath sounds normal. No respiratory distress. He has no wheezes. He has no rales. He exhibits no tenderness.  Abdominal: Soft. Bowel sounds are normal. He exhibits no distension and no mass. There is no tenderness.  Genitourinary: Rectum normal, prostate normal and penis normal. Guaiac negative stool.  Musculoskeletal: He exhibits no edema or tenderness.  Hammer toe right 3rd.  Lymphadenopathy:    He has no cervical adenopathy.  Neurological: He is alert and oriented to person, place, and time. No cranial nerve deficit.  Skin: Skin is dry. No rash noted. No erythema. No pallor.  Atrophic plaque of the left forearm.  Psychiatric: His speech is normal and behavior is normal. Judgment and thought content normal. Cognition and memory are normal. He exhibits a depressed mood.     Labs reviewed: No visits with results within 3 Month(s) from this visit. Latest known visit with results is:  Appointment on 10/18/2013  Component Date Value Ref Range Status  . WBC 10/18/2013 4.1  3.4 - 10.8 x10E3/uL Final  . RBC 10/18/2013 4.22  4.14 - 5.80 x10E6/uL Final  . Hemoglobin 10/18/2013 13.3  12.6 - 17.7 g/dL Final  . HCT 10/18/2013 38.3  37.5 - 51.0 % Final  . MCV 10/18/2013 91  79 - 97 fL Final  .  MCH 10/18/2013 31.5  26.6 - 33.0 pg Final  . MCHC 10/18/2013 34.7  31.5 - 35.7 g/dL Final  . RDW 10/18/2013  14.1  12.3 - 15.4 % Final  . Neutrophils Relative % 10/18/2013 60   Final  . Lymphs 10/18/2013 25   Final  . Monocytes 10/18/2013 11   Final  . Eos 10/18/2013 3   Final  . Basos 10/18/2013 1   Final  . Neutrophils Absolute 10/18/2013 2.4  1.4 - 7.0 x10E3/uL Final  . Lymphocytes Absolute 10/18/2013 1.0  0.7 - 3.1 x10E3/uL Final  . Monocytes Absolute 10/18/2013 0.4  0.1 - 0.9 x10E3/uL Final  . Eosinophils Absolute 10/18/2013 0.1  0.0 - 0.4 x10E3/uL Final  . Basophils Absolute 10/18/2013 0.1  0.0 - 0.2 x10E3/uL Final  . Immature Granulocytes 10/18/2013 0   Final  . Immature Grans (Abs) 10/18/2013 0.0  0.0 - 0.1 x10E3/uL Final  . Glucose 10/18/2013 94  65 - 99 mg/dL Final  . BUN 10/18/2013 18  8 - 27 mg/dL Final  . Creatinine, Ser 10/18/2013 1.23  0.76 - 1.27 mg/dL Final  . GFR calc non Af Amer 10/18/2013 58* >59 mL/min/1.73 Final  . GFR calc Af Amer 10/18/2013 67  >59 mL/min/1.73 Final  . BUN/Creatinine Ratio 10/18/2013 15  10 - 22 Final  . Sodium 10/18/2013 142  134 - 144 mmol/L Final  . Potassium 10/18/2013 4.9  3.5 - 5.2 mmol/L Final  . Chloride 10/18/2013 103  97 - 108 mmol/L Final  . CO2 10/18/2013 27  18 - 29 mmol/L Final  . Calcium 10/18/2013 9.6  8.6 - 10.2 mg/dL Final  . Total Protein 10/18/2013 5.8* 6.0 - 8.5 g/dL Final  . Albumin 10/18/2013 4.2  3.5 - 4.8 g/dL Final  . Globulin, Total 10/18/2013 1.6  1.5 - 4.5 g/dL Final  . Albumin/Globulin Ratio 10/18/2013 2.6* 1.1 - 2.5 Final  . Total Bilirubin 10/18/2013 0.7  0.0 - 1.2 mg/dL Final  . Alkaline Phosphatase 10/18/2013 69  39 - 117 IU/L Final  . AST 10/18/2013 44* 0 - 40 IU/L Final  . ALT 10/18/2013 35  0 - 44 IU/L Final    No results found.   Assessment/Plan  1. Essential hypertension Lose weight. Continue current meds  2. Obesity Discussed diet and increased activity  3. CKD (chronic kidney disease), stage 2 (mild) Recheck lab in the future  4. Pain in left foot resolved  5. Pain in joint, shoulder region,  unspecified laterality continue with chiropractor  6. Enteritis due to Clostridium difficile Resolved.Stools normal now.

## 2014-12-04 NOTE — Progress Notes (Signed)
Passed clock test 

## 2014-12-09 DIAGNOSIS — M9903 Segmental and somatic dysfunction of lumbar region: Secondary | ICD-10-CM | POA: Diagnosis not present

## 2014-12-09 DIAGNOSIS — M47816 Spondylosis without myelopathy or radiculopathy, lumbar region: Secondary | ICD-10-CM | POA: Diagnosis not present

## 2014-12-11 ENCOUNTER — Ambulatory Visit: Payer: Medicare Other | Admitting: Internal Medicine

## 2014-12-18 DIAGNOSIS — M47816 Spondylosis without myelopathy or radiculopathy, lumbar region: Secondary | ICD-10-CM | POA: Diagnosis not present

## 2014-12-18 DIAGNOSIS — M9903 Segmental and somatic dysfunction of lumbar region: Secondary | ICD-10-CM | POA: Diagnosis not present

## 2014-12-19 DIAGNOSIS — M9903 Segmental and somatic dysfunction of lumbar region: Secondary | ICD-10-CM | POA: Diagnosis not present

## 2014-12-19 DIAGNOSIS — M47816 Spondylosis without myelopathy or radiculopathy, lumbar region: Secondary | ICD-10-CM | POA: Diagnosis not present

## 2014-12-23 ENCOUNTER — Other Ambulatory Visit: Payer: Self-pay | Admitting: Internal Medicine

## 2014-12-23 DIAGNOSIS — M47816 Spondylosis without myelopathy or radiculopathy, lumbar region: Secondary | ICD-10-CM | POA: Diagnosis not present

## 2014-12-23 DIAGNOSIS — M9903 Segmental and somatic dysfunction of lumbar region: Secondary | ICD-10-CM | POA: Diagnosis not present

## 2015-01-09 ENCOUNTER — Telehealth: Payer: Self-pay | Admitting: *Deleted

## 2015-01-09 NOTE — Telephone Encounter (Signed)
Patient called and stated that he wanted an appointment with you to discuss his wife. Stated that she was not eating and weaker. Not sure how to go about putting him in for an appointment to discuss his wife's concerns without having her here also. He does not want to bring her in or want her to know he is talking to you. Please Advise.

## 2015-01-13 ENCOUNTER — Encounter: Payer: Self-pay | Admitting: Internal Medicine

## 2015-01-13 ENCOUNTER — Ambulatory Visit (INDEPENDENT_AMBULATORY_CARE_PROVIDER_SITE_OTHER): Payer: Medicare Other | Admitting: Internal Medicine

## 2015-01-13 VITALS — BP 164/82 | HR 57 | Temp 97.4°F | Resp 18 | Ht 75.0 in | Wt 250.6 lb

## 2015-01-13 DIAGNOSIS — Z658 Other specified problems related to psychosocial circumstances: Secondary | ICD-10-CM

## 2015-01-13 DIAGNOSIS — F439 Reaction to severe stress, unspecified: Secondary | ICD-10-CM

## 2015-01-13 DIAGNOSIS — B029 Zoster without complications: Secondary | ICD-10-CM

## 2015-01-13 DIAGNOSIS — E669 Obesity, unspecified: Secondary | ICD-10-CM

## 2015-01-13 MED ORDER — GABAPENTIN 100 MG PO CAPS: 100.0000 mg | ORAL_CAPSULE | Freq: Three times a day (TID) | ORAL | Status: DC | PRN

## 2015-01-13 MED ORDER — VALACYCLOVIR HCL 1 G PO TABS: 1000.0000 mg | ORAL_TABLET | Freq: Three times a day (TID) | ORAL | Status: DC

## 2015-01-13 NOTE — Patient Instructions (Signed)
Take the valacyclovir for the herpes zoster infection three times daily for a week. You may use the gabapentin three times daily as needed for the stinging pain--I gave you a one month supply. When the vesicles are draining, keep the area covered. Avoid contact with immunocompromised people and pregnant women while you have open draining lesions.  Shingles Shingles (herpes zoster) is an infection that is caused by the same virus that causes chickenpox (varicella). The infection causes a painful skin rash and fluid-filled blisters, which eventually break open, crust over, and heal. It may occur in any area of the body, but it usually affects only one side of the body or face. The pain of shingles usually lasts about 1 month. However, some people with shingles may develop long-term (chronic) pain in the affected area of the body. Shingles often occurs many years after the person had chickenpox. It is more common:  In people older than 50 years.  In people with weakened immune systems, such as those with HIV, AIDS, or cancer.  In people taking medicines that weaken the immune system, such as transplant medicines.  In people under great stress. CAUSES  Shingles is caused by the varicella zoster virus (VZV), which also causes chickenpox. After a person is infected with the virus, it can remain in the person's body for years in an inactive state (dormant). To cause shingles, the virus reactivates and breaks out as an infection in a nerve root. The virus can be spread from person to person (contagious) through contact with open blisters of the shingles rash. It will only spread to people who have not had chickenpox. When these people are exposed to the virus, they may develop chickenpox. They will not develop shingles. Once the blisters scab over, the person is no longer contagious and cannot spread the virus to others. SIGNS AND SYMPTOMS  Shingles shows up in stages. The initial symptoms may be pain,  itching, and tingling in an area of the skin. This pain is usually described as burning, stabbing, or throbbing.In a few days or weeks, a painful red rash will appear in the area where the pain, itching, and tingling were felt. The rash is usually on one side of the body in a band or belt-like pattern. Then, the rash usually turns into fluid-filled blisters. They will scab over and dry up in approximately 2-3 weeks. Flu-like symptoms may also occur with the initial symptoms, the rash, or the blisters. These may include:  Fever.  Chills.  Headache.  Upset stomach. DIAGNOSIS  Your health care provider will perform a skin exam to diagnose shingles. Skin scrapings or fluid samples may also be taken from the blisters. This sample will be examined under a microscope or sent to a lab for further testing. TREATMENT  There is no specific cure for shingles. Your health care provider will likely prescribe medicines to help you manage the pain, recover faster, and avoid long-term problems. This may include antiviral drugs, anti-inflammatory drugs, and pain medicines. HOME CARE INSTRUCTIONS   Take a cool bath or apply cool compresses to the area of the rash or blisters as directed. This may help with the pain and itching.   Take medicines only as directed by your health care provider.   Rest as directed by your health care provider.  Keep your rash and blisters clean with mild soap and cool water or as directed by your health care provider.  Do not pick your blisters or scratch your rash. Apply an  anti-itch cream or numbing creams to the affected area as directed by your health care provider.  Keep your shingles rash covered with a loose bandage (dressing).  Avoid skin contact with:  Babies.   Pregnant women.   Children with eczema.   Elderly people with transplants.   People with chronic illnesses, such as leukemia or AIDS.   Wear loose-fitting clothing to help ease the pain of  material rubbing against the rash.  Keep all follow-up visits as directed by your health care provider.If the area involved is on your face, you may receive a referral for a specialist, such as an eye doctor (ophthalmologist) or an ear, nose, and throat (ENT) doctor. Keeping all follow-up visits will help you avoid eye problems, chronic pain, or disability.  SEEK IMMEDIATE MEDICAL CARE IF:   You have facial pain, pain around the eye area, or loss of feeling on one side of your face.  You have ear pain or ringing in your ear.  You have loss of taste.  Your pain is not relieved with prescribed medicines.   Your redness or swelling spreads.   You have more pain and swelling.  Your condition is worsening or has changed.   You have a fever. MAKE SURE YOU:  Understand these instructions.  Will watch your condition.  Will get help right away if you are not doing well or get worse. Document Released: 05/17/2005 Document Revised: 10/01/2013 Document Reviewed: 12/30/2011 [facility] Patient Information 2015 [place], [place]. This information is not intended to replace advice given to you by your health care provider. Make sure you discuss any questions you have with your health care provider.

## 2015-01-13 NOTE — Progress Notes (Signed)
Patient ID: [name] [name]., male   DOB: [birth year], 74 y.o.   MRN: [id number]   Location:  [facility]  Goals of Care: Advanced Directive information     Chief Complaint  Patient presents with  . Acute Visit    ? shingles    HPI: Patient is a 74 y.o. white male seen in the office today for an acute visit due to pain that began 4 days ago that he thought could be pulled muscles, but rash began last night on his chest. Around T6 dermatome.    Stinging pain.  <5/10 pain.  Already has oxycodone and hydrocodone/ibuprofen as needed for his right shoulder pain.  Tries not to use the oxycodone b/c "it makes me look like a cheap drunk."    He is under a lot of stress.  No other clear risk factors aside from aging.    He also is trying to lose weight and has come down 11 lbs since last visit.  Tells me it is hard with all of the fast food and commercials around.  Review of Systems:  Review of Systems  Constitutional: Positive for weight loss. Negative for fever and chills.  Cardiovascular: Positive for chest pain.       Under right breast region that felt like muscles until rash developed in past day  Musculoskeletal: Positive for joint pain.       Right shoulder  Skin: Positive for rash. Negative for itching.       tingling  Neurological: Positive for tingling and sensory change.       Beneath right breast and around his back    Past Medical History  Diagnosis Date  . HTN (hypertension)   . Partial deafness   . Clostridium difficile colitis   . GERD (gastroesophageal reflux disease)   . Allergy   . Hx of echocardiogram     a. Echo 11/12:  Mod LVH, EF 55-60%, MAC, mod LAE, mild RAE  . Depression   . Atrial flutter     a. dx after inguinal hernia repair in 10/12 => seen by Dr. Acie Fredrickson  . Arthritis     "right shoulder" (05/03/2012)  . Kidney stone     "they just passed" (05/03/2012)  . Allergic rhinitis due to pollen   . Pain in joint,  pelvic region and thigh   . Complications affecting other specified body systems, hypertension   . Other specified cardiac dysrhythmias(427.89)   . Rash and other nonspecific skin eruption   . Sacroiliitis, not elsewhere classified   . Obesity, unspecified   . Inguinal hernia without mention of obstruction or gangrene, unilateral or unspecified, (not specified as recurrent)   . Intestinal infection due to Clostridium difficile   . Irritable bowel syndrome   . Special screening for malignant neoplasm of prostate   . Unspecified hereditary and idiopathic peripheral neuropathy   . Sacroiliitis, not elsewhere classified   . Spasm of muscle   . Cervicalgia   . Degeneration of cervical intervertebral disc   . Pain in joint, ankle and foot   . Cervicalgia   . Pain in joint, shoulder region   . Nonspecific abnormal electrocardiogram (ECG) (EKG)   . Calculus of kidney   . Depressive disorder, not elsewhere classified   . Unspecified constipation   . Pain in joint, lower leg   . Anxiety state, unspecified   . Other malaise and fatigue   . Insomnia, unspecified   .  Lumbago   . Coronary atherosclerosis of unspecified type of vessel, native or graft   . Nonspecific abnormal results of pulmonary system function study   . Impotence of organic origin   . Osteoarthrosis, unspecified whether generalized or localized, unspecified site     Past Surgical History  Procedure Laterality Date  . Appendectomy  ~ 1956  . Tonsillectomy  ~ 1946  . Renal cyst excision      pt denies this hx on 05/03/2012  . Colonoscopy  06/06/2008    severe sigmoid diverticulosis and internal hemorrhoids  . Esophagogastroduodenoscopy  06/06/2008    normal  . Posterior lumbar fusion  1989  . Total hip arthroplasty  1999-2000    bilateral  . Carpal tunnel with cubital tunnel  2004    "right" (05/03/2012)  . Cardiac catheterization  08/1999; 05/03/2012    normal coronary arteries;   . Inguinal hernia repair  03/23/11     "left" (05/03/2012)  . Spine surgery  1969    Pantopaque Myelography and spinal infusion- Dr. Lyman Speller  . Reconstruction (r) foot surgery      DR SUE   . Right knee  [place]  . Atrial flutter ablation N/A 05/03/2012    Procedure: ATRIAL FLUTTER ABLATION;  Surgeon: Evans Lance, MD;  Location: [facility] CATH LAB;  Service: Cardiovascular;  Laterality: N/A;    Allergies  Allergen Reactions  . Amitriptyline   . Azithromycin     Stomach pain  . Flexeril [Cyclobenzaprine]     Causes dizziness   Medications: Patient's Medications  New Prescriptions   No medications on file  Previous Medications   ASPIRIN EC 81 MG TABLET    Take 1 tablet (81 mg total) by mouth daily.   HYDROCHLOROTHIAZIDE (HYDRODIURIL) 25 MG TABLET    Take 1/2 tablet by mouth twice daily for blood pressure   HYDROCODONE-IBUPROFEN (VICOPROFEN) 7.5-200 MG PER TABLET    Take one tablet by mouth up to four times daily as needed for pain.   LOSARTAN-HYDROCHLOROTHIAZIDE (HYZAAR) 100-25 MG PER TABLET    TAKE ONE TABLET BY MOUTH ONCE DAILY TO CONTROL BLOOD PRESSURE   NEXIUM 40 MG CAPSULE       ONDANSETRON (ZOFRAN) 4 MG TABLET    TAKE 1 TABLET BY MOUTH EVERY 8 HOURS IF NEEDED FOR NAUSEA   OXYCODONE (ROXICODONE) 30 MG IMMEDIATE RELEASE TABLET    One every 3 hours if needed to control pain   VITAMIN C (ASCORBIC ACID) 500 MG TABLET    Take 2,000 mg by mouth daily.   Modified Medications   No medications on file  Discontinued Medications   No medications on file    Physical Exam: Filed Vitals:   01/13/15 1047  BP: 164/82  Pulse: 57  Temp: 97.4 F (36.3 C)  TempSrc: Oral  Resp: 18  Height: 6\' 3"  (1.905 m)  Weight: 250 lb 9.6 oz (113.671 kg)  SpO2: 97%   Physical Exam  Constitutional: He appears well-developed and well-nourished. No distress.  Musculoskeletal: He exhibits tenderness.  Right shoulder  Skin: Skin is warm and dry. Rash noted.  Clustered papulovesicular rash in thoracic dermatomal distribution beneath right  breast anteriorly and posteriorly--none open and draining at present    Labs reviewed: Basic Metabolic Panel: No results for input(s): NA, K, CL, CO2, GLUCOSE, BUN, CREATININE, CALCIUM, MG, PHOS, TSH in the last 8760 hours. Liver Function Tests: No results for input(s): AST, ALT, ALKPHOS, BILITOT, PROT, ALBUMIN in the last 8760 hours. No results  for input(s): LIPASE, AMYLASE in the last 8760 hours. No results for input(s): AMMONIA in the last 8760 hours. CBC: No results for input(s): WBC, NEUTROABS, HGB, HCT, MCV, PLT in the last 8760 hours. Lipid Panel: No results for input(s): CHOL, HDL, LDLCALC, TRIG, CHOLHDL, LDLDIRECT in the last 8760 hours. Lab Results  Component Value Date   HGBA1C * 08/10/2010    5.8 (NOTE)                                                                       According to the ADA Clinical Practice Recommendations for 2011, when HbA1c is used as a screening test:   >=6.5%   Diagnostic of Diabetes Mellitus           (if abnormal result  is confirmed)  5.7-6.4%   Increased risk of developing Diabetes Mellitus  References:Diagnosis and Classification of Diabetes Mellitus,Diabetes DGUY,4034,74(QVZDG 1):S62-S69 and Standards of Medical Care in         Diabetes - 2011,Diabetes Care,2011,34  (Suppl 1):S11-S61.    Assessment/Plan 1. Herpes zoster infection of thoracic region - will treat with antiviral for one week and gabapentin for nerve pain as needed (one month supply provided) - gabapentin (NEURONTIN) 100 MG capsule; Take 1 capsule (100 mg total) by mouth 3 (three) times daily as needed.  Dispense: 90 capsule; Refill: 0 - valACYclovir (VALTREX) 1000 MG tablet; Take 1 tablet (1,000 mg total) by mouth 3 (three) times daily.  Dispense: 21 tablet; Refill: 0 -droplet precautions discussed and instructions provided  2. Stress -multifactorial -says he has trouble controlling this and it may have contributed to his shingles breakout  3.  Obesity -has lost 11 lbs since  his last visit here -cont diet and walking for exercise  Next appt: return prn for uncontrolled pain  Doak Mah L. Wendelyn Kiesling, D.O. [facility] [address], [place] [address] Cell Phone (Mon-Fri 8am-5pm):  [phone] On Call:  [phone] & follow prompts after 5pm & weekends Office Phone:  [phone] Office Fax:  [phone]

## 2015-01-15 DIAGNOSIS — H25013 Cortical age-related cataract, bilateral: Secondary | ICD-10-CM | POA: Diagnosis not present

## 2015-01-15 DIAGNOSIS — H2513 Age-related nuclear cataract, bilateral: Secondary | ICD-10-CM | POA: Diagnosis not present

## 2015-01-16 NOTE — Telephone Encounter (Signed)
Just schedule a regular appointment for him. I will code it with anxiety, marital concerns or something appropriate

## 2015-01-16 NOTE — Telephone Encounter (Signed)
Appointment scheduled for 01/22/2015 at 2:00 with Dr. Nyoka Cowden Left message on voicemail regarding appointment time and date and told him to call if any questions.

## 2015-01-22 ENCOUNTER — Encounter: Payer: Self-pay | Admitting: Internal Medicine

## 2015-01-22 ENCOUNTER — Ambulatory Visit (INDEPENDENT_AMBULATORY_CARE_PROVIDER_SITE_OTHER): Payer: Medicare Other | Admitting: Internal Medicine

## 2015-01-22 VITALS — BP 160/90 | HR 58 | Temp 98.1°F | Resp 20 | Ht 75.0 in | Wt 255.4 lb

## 2015-01-22 DIAGNOSIS — Z638 Other specified problems related to primary support group: Secondary | ICD-10-CM | POA: Diagnosis not present

## 2015-01-22 DIAGNOSIS — F439 Reaction to severe stress, unspecified: Secondary | ICD-10-CM

## 2015-01-22 DIAGNOSIS — M12811 Other specific arthropathies, not elsewhere classified, right shoulder: Secondary | ICD-10-CM

## 2015-01-22 DIAGNOSIS — B029 Zoster without complications: Secondary | ICD-10-CM | POA: Diagnosis not present

## 2015-01-22 DIAGNOSIS — M75101 Unspecified rotator cuff tear or rupture of right shoulder, not specified as traumatic: Secondary | ICD-10-CM

## 2015-01-22 DIAGNOSIS — I10 Essential (primary) hypertension: Secondary | ICD-10-CM

## 2015-01-22 NOTE — Progress Notes (Signed)
Patient ID: [name] Camp., male   DOB: 12/03/1940, 74 y.o.   MRN: [id number]    Facility  [place]    Place of Service:   OFFICE    Allergies  Allergen Reactions  . Amitriptyline   . Azithromycin     Stomach pain  . Flexeril [Cyclobenzaprine]     Causes dizziness    Chief Complaint  Patient presents with  . Acute Visit    pain from shingles so bad not able to sleep    HPI:  Herpes zoster - currently using oxycodone. He has finished his antivirals treatment prescribed by Dr. Mariea Clonts. Pains are coming under better control.  Rotator cuff tear arthropathy of right shoulder - pains in the shoulders actually see the pain from shingles at this time. He gets some relief from oxycodone. He has had previous orthopedic evaluations. 3 different orthopedist told him he is not a good surgical candidate.   Essential hypertension - mild elevation in systolic blood pressure, likely related to pain today.  Stress at home - continued stresses related to his wife. She is manic depressive and currently seems to be a depressed phase. She is not responding well to her anti-depressive medications. She lacks motivation. She is not participating in any household chores or tasks. She is not eating well. Patient is very concerned about her physical and emotional well-being. He feels that he is failing her and not being able to motivate her.    Medications: Patient's Medications  New Prescriptions   No medications on file  Previous Medications   ASPIRIN EC 81 MG TABLET    Take 1 tablet (81 mg total) by mouth daily.   GABAPENTIN (NEURONTIN) 100 MG CAPSULE    Take 1 capsule (100 mg total) by mouth 3 (three) times daily as needed.   HYDROCHLOROTHIAZIDE (HYDRODIURIL) 25 MG TABLET    Take 1/2 tablet by mouth twice daily for blood pressure   HYDROCODONE-IBUPROFEN (VICOPROFEN) 7.5-200 MG PER TABLET    Take one tablet by mouth up to four times daily as needed for pain.   LOSARTAN-HYDROCHLOROTHIAZIDE (HYZAAR)  100-25 MG PER TABLET    TAKE ONE TABLET BY MOUTH ONCE DAILY TO CONTROL BLOOD PRESSURE   NEXIUM 40 MG CAPSULE       ONDANSETRON (ZOFRAN) 4 MG TABLET    TAKE 1 TABLET BY MOUTH EVERY 8 HOURS IF NEEDED FOR NAUSEA   OXYCODONE (ROXICODONE) 30 MG IMMEDIATE RELEASE TABLET    One every 3 hours if needed to control pain   VALACYCLOVIR (VALTREX) 1000 MG TABLET    Take 1 tablet (1,000 mg total) by mouth 3 (three) times daily.   VITAMIN C (ASCORBIC ACID) 500 MG TABLET    Take 2,000 mg by mouth daily.   Modified Medications   No medications on file  Discontinued Medications   No medications on file     Review of Systems  Constitutional: Positive for appetite change and fatigue. Negative for fever and chills.  HENT: Positive for hearing loss and sinus pressure. Negative for ear pain, trouble swallowing and voice change.   Eyes: Negative.   Cardiovascular: Negative.        Patient has hypertension.   Gastrointestinal: Negative for diarrhea, constipation, blood in stool and abdominal distention.  Endocrine: Negative.   Genitourinary: Negative.   Musculoskeletal:       Increased shoulder pains. Painful to raise or rotate shoulders.  Skin:       Painless atrophic plaque of the left forearm.  Extensive herpes zoster right thoracic area.  Neurological: Negative.   Hematological: Negative.   Psychiatric/Behavioral: Positive for dysphoric mood. Negative for suicidal ideas and self-injury. The patient is nervous/anxious.     Filed Vitals:   01/22/15 1439  BP: 160/90  Pulse: 58  Temp: 98.1 F (36.7 C)  TempSrc: Oral  Resp: 20  Height: 6\' 3"  (1.905 m)  Weight: 255 lb 6.4 oz (115.849 kg)  SpO2: 95%   Body mass index is 31.92 kg/(m^2).  Physical Exam  Constitutional: He is oriented to person, place, and time. He appears well-developed and well-nourished. No distress.  Moderately overweight  HENT:  Head: Normocephalic and atraumatic.  Significant partial deafness bilaterally.  Eyes:  Conjunctivae and EOM are normal. Pupils are equal, round, and reactive to light.  Neck: Normal range of motion. Neck supple. No JVD present. No tracheal deviation present. No thyromegaly present.  Cardiovascular: Normal rate, regular rhythm, normal heart sounds and intact distal pulses.  Exam reveals no gallop and no friction rub.   No murmur heard. Pulmonary/Chest: Effort normal and breath sounds normal. No respiratory distress. He has no wheezes. He has no rales. He exhibits no tenderness.  Abdominal: Soft. Bowel sounds are normal. He exhibits no distension and no mass. There is no tenderness.  Genitourinary: Rectum normal, prostate normal and penis normal. Guaiac negative stool.  Musculoskeletal: He exhibits no edema or tenderness.  Hammer toe right 3rd. Painful shoulders with elevation or rotation.  Lymphadenopathy:    He has no cervical adenopathy.  Neurological: He is alert and oriented to person, place, and time. No cranial nerve deficit.  Skin: Skin is dry. No rash noted. No erythema. No pallor.  Atrophic plaque of the left forearm. Painful herpetic rash of the right midthoracic region.  Psychiatric: His speech is normal and behavior is normal. Judgment and thought content normal. Cognition and memory are normal. He exhibits a depressed mood.  Anxiety and depressed mood.     Labs reviewed: No visits with results within 3 Month(s) from this visit. Latest known visit with results is:  Appointment on 10/18/2013  Component Date Value Ref Range Status  . WBC 10/18/2013 4.1  3.4 - 10.8 x10E3/uL Final  . RBC 10/18/2013 4.22  4.14 - 5.80 x10E6/uL Final  . Hemoglobin 10/18/2013 13.3  12.6 - 17.7 g/dL Final  . HCT 10/18/2013 38.3  37.5 - 51.0 % Final  . MCV 10/18/2013 91  79 - 97 fL Final  . MCH 10/18/2013 31.5  26.6 - 33.0 pg Final  . MCHC 10/18/2013 34.7  31.5 - 35.7 g/dL Final  . RDW 10/18/2013 14.1  12.3 - 15.4 % Final  . Neutrophils Relative % 10/18/2013 60   Final  . Lymphs  10/18/2013 25   Final  . Monocytes 10/18/2013 11   Final  . Eos 10/18/2013 3   Final  . Basos 10/18/2013 1   Final  . Neutrophils Absolute 10/18/2013 2.4  1.4 - 7.0 x10E3/uL Final  . Lymphocytes Absolute 10/18/2013 1.0  0.7 - 3.1 x10E3/uL Final  . Monocytes Absolute 10/18/2013 0.4  0.1 - 0.9 x10E3/uL Final  . Eosinophils Absolute 10/18/2013 0.1  0.0 - 0.4 x10E3/uL Final  . Basophils Absolute 10/18/2013 0.1  0.0 - 0.2 x10E3/uL Final  . Immature Granulocytes 10/18/2013 0   Final  . Immature Grans (Abs) 10/18/2013 0.0  0.0 - 0.1 x10E3/uL Final  . Glucose 10/18/2013 94  65 - 99 mg/dL Final  . BUN 10/18/2013 18  8 - 27 mg/dL  Final  . Creatinine, Ser 10/18/2013 1.23  0.76 - 1.27 mg/dL Final  . GFR calc non Af Amer 10/18/2013 58* >59 mL/min/1.73 Final  . GFR calc Af Amer 10/18/2013 67  >59 mL/min/1.73 Final  . BUN/Creatinine Ratio 10/18/2013 15  10 - 22 Final  . Sodium 10/18/2013 142  134 - 144 mmol/L Final  . Potassium 10/18/2013 4.9  3.5 - 5.2 mmol/L Final  . Chloride 10/18/2013 103  97 - 108 mmol/L Final  . CO2 10/18/2013 27  18 - 29 mmol/L Final  . Calcium 10/18/2013 9.6  8.6 - 10.2 mg/dL Final  . Total Protein 10/18/2013 5.8* 6.0 - 8.5 g/dL Final  . Albumin 10/18/2013 4.2  3.5 - 4.8 g/dL Final  . Globulin, Total 10/18/2013 1.6  1.5 - 4.5 g/dL Final  . Albumin/Globulin Ratio 10/18/2013 2.6* 1.1 - 2.5 Final  . Total Bilirubin 10/18/2013 0.7  0.0 - 1.2 mg/dL Final  . Alkaline Phosphatase 10/18/2013 69  39 - 117 IU/L Final  . AST 10/18/2013 44* 0 - 40 IU/L Final  . ALT 10/18/2013 35  0 - 44 IU/L Final     Assessment/Plan  1. Herpes zoster Counseled on use of pain medication.  2. Rotator cuff tear arthropathy of right shoulder - oxycodone (ROXICODONE) 30 MG immediate release tablet; One every 3 hours if needed to control pain  Dispense: 240 tablet; Refill: 0  3. Essential hypertension Mild elevation in systolic blood pressure, likely related to pain  4. Stress at home 20 minutes  discussion of the issues that are stressing him in regards to his wife's depression and behavior.

## 2015-01-24 ENCOUNTER — Telehealth: Payer: Self-pay

## 2015-01-24 MED ORDER — OXYCODONE HCL 30 MG PO TABS: ORAL_TABLET | ORAL | Status: DC

## 2015-01-24 MED ORDER — ONDANSETRON HCL 4 MG PO TABS: ORAL_TABLET | ORAL | Status: DC

## 2015-01-24 NOTE — Telephone Encounter (Signed)
Patient had called for Rx Oxycodone, saw Dr. Nyoka Cowden  01/22/15 he was to get Rx for the Oxycodone. In note Dr. Nyoka Cowden had the Rx pending, printed Rx for Dr. Mariea Clonts to sign.Called patient left message on cell to pick up Rx.

## 2015-01-26 DIAGNOSIS — F439 Reaction to severe stress, unspecified: Secondary | ICD-10-CM | POA: Insufficient documentation

## 2015-01-26 DIAGNOSIS — B029 Zoster without complications: Secondary | ICD-10-CM | POA: Insufficient documentation

## 2015-02-04 ENCOUNTER — Ambulatory Visit (INDEPENDENT_AMBULATORY_CARE_PROVIDER_SITE_OTHER): Payer: Medicare Other

## 2015-02-04 DIAGNOSIS — Z23 Encounter for immunization: Secondary | ICD-10-CM | POA: Diagnosis not present

## 2015-02-05 DIAGNOSIS — M25511 Pain in right shoulder: Secondary | ICD-10-CM | POA: Diagnosis not present

## 2015-02-09 ENCOUNTER — Other Ambulatory Visit: Payer: Self-pay | Admitting: Internal Medicine

## 2015-02-13 ENCOUNTER — Telehealth: Payer: Self-pay | Admitting: *Deleted

## 2015-02-13 NOTE — Telephone Encounter (Signed)
Patient called and stated that he saw you on 01/13/15 for shingles and he took the medication prescribed and the shingles are no better, still a lot of burning and itching. He wants to know should he take a second round of the medication? Please Advise.

## 2015-02-13 NOTE — Telephone Encounter (Signed)
Patient will try this and let us know how he does.

## 2015-02-13 NOTE — Telephone Encounter (Signed)
Is the rash itself improving?  It should be getting better.   He could unfortunately have residual pain for weeks, months to forever from the shingles.  I see he already saw Dr. Nyoka Cowden since he saw me and discussed his pain with him.  It looks like the same oxycodone regimen was continued. If he is interested, we can try him on gabapentin 100mg  po tid for the pain.  This is for nerve pain.  It can make people sleepy so he should try it first at bedtime.

## 2015-02-21 ENCOUNTER — Telehealth: Payer: Self-pay | Admitting: *Deleted

## 2015-02-21 NOTE — Telephone Encounter (Signed)
Patient called and stated that he was having cold Sx. Coughing up congestion. He stated that he was feeling a lot better and taking Mucinex. Told him to finish up the 2 days of Mucinex he had left and increase his fluids. He agreed and stated that he had no fever and was feeling good will call if symptoms worsen.

## 2015-02-24 ENCOUNTER — Ambulatory Visit (INDEPENDENT_AMBULATORY_CARE_PROVIDER_SITE_OTHER): Payer: Medicare Other | Admitting: Internal Medicine

## 2015-02-24 ENCOUNTER — Encounter: Payer: Self-pay | Admitting: Internal Medicine

## 2015-02-24 VITALS — BP 130/80 | HR 85 | Temp 98.7°F | Resp 20 | Ht 75.0 in | Wt 244.4 lb

## 2015-02-24 DIAGNOSIS — H6121 Impacted cerumen, right ear: Secondary | ICD-10-CM

## 2015-02-24 DIAGNOSIS — B0229 Other postherpetic nervous system involvement: Secondary | ICD-10-CM | POA: Diagnosis not present

## 2015-02-24 DIAGNOSIS — J0101 Acute recurrent maxillary sinusitis: Secondary | ICD-10-CM | POA: Diagnosis not present

## 2015-02-24 MED ORDER — AMOXICILLIN-POT CLAVULANATE 875-125 MG PO TABS: 1.0000 | ORAL_TABLET | Freq: Two times a day (BID) | ORAL | Status: DC

## 2015-02-24 NOTE — Patient Instructions (Signed)
Sinusitis Sinusitis is redness, soreness, and inflammation of the paranasal sinuses. Paranasal sinuses are air pockets within the bones of your face (beneath the eyes, the middle of the forehead, or above the eyes). In healthy paranasal sinuses, mucus is able to drain out, and air is able to circulate through them by way of your nose. However, when your paranasal sinuses are inflamed, mucus and air can become trapped. This can allow bacteria and other germs to grow and cause infection. Sinusitis can develop quickly and last only a short time (acute) or continue over a long period (chronic). Sinusitis that lasts for more than 12 weeks is considered chronic.  CAUSES  Causes of sinusitis include:  Allergies.  Structural abnormalities, such as displacement of the cartilage that separates your nostrils (deviated septum), which can decrease the air flow through your nose and sinuses and affect sinus drainage.  Functional abnormalities, such as when the small hairs (cilia) that line your sinuses and help remove mucus do not work properly or are not present. SIGNS AND SYMPTOMS  Symptoms of acute and chronic sinusitis are the same. The primary symptoms are pain and pressure around the affected sinuses. Other symptoms include:  Upper toothache.  Earache.  Headache.  Bad breath.  Decreased sense of smell and taste.  A cough, which worsens when you are lying flat.  Fatigue.  Fever.  Thick drainage from your nose, which often is green and may contain pus (purulent).  Swelling and warmth over the affected sinuses. DIAGNOSIS  Your health care provider will perform a physical exam. During the exam, your health care provider may:  Look in your nose for signs of abnormal growths in your nostrils (nasal polyps).  Tap over the affected sinus to check for signs of infection.  View the inside of your sinuses (endoscopy) using an imaging device that has a light attached (endoscope). If your health  care provider suspects that you have chronic sinusitis, one or more of the following tests may be recommended:  Allergy tests.  Nasal culture. A sample of mucus is taken from your nose, sent to a lab, and screened for bacteria.  Nasal cytology. A sample of mucus is taken from your nose and examined by your health care provider to determine if your sinusitis is related to an allergy. TREATMENT  Most cases of acute sinusitis are related to a viral infection and will resolve on their own within 10 days. Sometimes medicines are prescribed to help relieve symptoms (pain medicine, decongestants, nasal steroid sprays, or saline sprays).  However, for sinusitis related to a bacterial infection, your health care provider will prescribe antibiotic medicines. These are medicines that will help kill the bacteria causing the infection.  Rarely, sinusitis is caused by a fungal infection. In theses cases, your health care provider will prescribe antifungal medicine. For some cases of chronic sinusitis, surgery is needed. Generally, these are cases in which sinusitis recurs more than 3 times per year, despite other treatments. HOME CARE INSTRUCTIONS   Drink plenty of water. Water helps thin the mucus so your sinuses can drain more easily.  Use a humidifier.  Inhale steam 3 to 4 times a day (for example, sit in the bathroom with the shower running).  Apply a warm, moist washcloth to your face 3 to 4 times a day, or as directed by your health care provider.  Use saline nasal sprays to help moisten and clean your sinuses.  Take medicines only as directed by your health care provider.    If you were prescribed either an antibiotic or antifungal medicine, finish it all even if you start to feel better. SEEK IMMEDIATE MEDICAL CARE IF:  You have increasing pain or severe headaches.  You have nausea, vomiting, or drowsiness.  You have swelling around your face.  You have vision problems.  You have a stiff  neck.  You have difficulty breathing. MAKE SURE YOU:   Understand these instructions.  Will watch your condition.  Will get help right away if you are not doing well or get worse. Document Released: 05/17/2005 Document Revised: 10/01/2013 Document Reviewed: 06/01/2011 [facility] Patient Information 2015 [place], [place]. This information is not intended to replace advice given to you by your health care provider. Make sure you discuss any questions you have with your health care provider. Cerumen Impaction A cerumen impaction is when the wax in your ear forms a plug. This plug usually causes reduced hearing. Sometimes it also causes an earache or dizziness. Removing a cerumen impaction can be difficult and painful. The wax sticks to the ear canal. The canal is sensitive and bleeds easily. If you try to remove a heavy wax buildup with a cotton tipped swab, you may push it in further. Irrigation with water, suction, and small ear curettes may be used to clear out the wax. If the impaction is fixed to the skin in the ear canal, ear drops may be needed for a few days to loosen the wax. People who build up a lot of wax frequently can use ear wax removal products available in your local drugstore. SEEK MEDICAL CARE IF:  You develop an earache, increased hearing loss, or marked dizziness. Document Released: 06/24/2004 Document Revised: 08/09/2011 Document Reviewed: 08/14/2009 [facility] Patient Information 2015 [place], [place]. This information is not intended to replace advice given to you by your health care provider. Make sure you discuss any questions you have with your health care provider. Postherpetic Neuralgia Postherpetic neuralgia (PHN) is nerve pain that occurs after a shingles infection. Shingles is a painful rash that appears on one side of the body, usually on your trunk or face. Shingles is caused by the varicella-zoster virus. This is the same virus that causes chickenpox. In people who have  had chickenpox, the virus can resurface years later and cause shingles. You may have PHN if you continue to have pain for 3 months after your shingles rash has gone away. PHN appears in the same area where you had the shingles rash. For most people, PHN goes away within 1 year.  Getting a vaccination for shingles can prevent PHN. This vaccine is recommended for people older than 50. It may prevent shingles and may also lower your risk of PHN if you do get shingles. CAUSES PHN is caused by damage to your nerves from the varicella-zoster virus. This damage makes your nerves overly sensitive.  RISK FACTORS Aging is the biggest risk factor for developing PHN. Most people who get PHN are older than 15. Other risk factors include:  Having very bad pain before your shingles rash starts.  Having a very bad rash.  Having shingles in the nerve that supplies your face and eye (trigeminal nerve). SIGNS AND SYMPTOMS Pain is the main symptom of PHN. The pain is often very bad and may be described as stabbing, burning, or feeling like an electric shock. The pain may come and go or may be there all the time. Pain may be triggered by light touches on the skin or changes in temperature. You  may have itching along with the pain. DIAGNOSIS  Your health care provider may diagnose PHN based on your symptoms and your history of shingles. Lab studies and other diagnostic tests are usually not needed. TREATMENT  There is no cure for PHN. Treatment for PHN will focus on pain relief. Over-the-counter pain relievers do not usually relieve PHN pain. You may need to work with a pain specialist. Treatment may include:  Antidepressant medicines to help with pain and improve sleep.  Antiseizure medicines to relieve nerve pain.  Strong pain relievers (opioids).  A numbing patch worn on the skin (lidocaine patch). HOME CARE INSTRUCTIONS It may take a long time to recover from PHN. Work closely with your health care  provider, and have a good support system at home.   Take all medicines as directed by your health care provider.  Wear loose, comfortable clothing.  Cover sensitive areas with a dressing to reduce friction from clothing rubbing on the area.  If cold does not make your pain worse, try applying a cool compress or cooling gel pack to the area.  Talk to your health care provider if you feel depressed or desperate. Living with long-term pain can be depressing. SEEK MEDICAL CARE IF:  Your medicine is not helping.  You are struggling to manage your pain at home. Document Released: 08/07/2002 Document Revised: 10/01/2013 Document Reviewed: 05/08/2013 [facility] Patient Information 2015 [place], [place]. This information is not intended to replace advice given to you by your health care provider. Make sure you discuss any questions you have with your health care provider.

## 2015-02-24 NOTE — Progress Notes (Signed)
Patient ID: [name] Camp., male   DOB: Oct 29, 1940, 74 y.o.   MRN: [id number]   Location:  [facility]  Goals of Care: Advanced Directive information Does patient have an advance directive?: Yes, Does patient want to make changes to advanced directive?: No - Patient declined   Chief Complaint  Patient presents with  . Acute Visit    congestion and cough x 7-8 days(OTC) Mucinex    HPI: Patient is a 74 y.o. white male patient of Dr. Rolly Salter seen in the office today for an acute visit.    Right chest wall shingles--pain continues on chest, back part is better.  Wants to know when it will go away  Tried mucinex for 7 days.  Mostly clear with rare shade of green.  Finished last night.  When cough not loosening.  Afebrile.  Feels fine.  Yesterday at church, felt clammy in the building.  Felt like shirt was tight around his neck.  He got mad.  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  Was sweating and near-syncopal.  Admits he was not well hydrated. No nausea, vomiting.  No chest pain, no shortness of breath.   No headaches, sore throat.  Also has not been eating very much.    Review of Systems:  Review of Systems  Constitutional: Positive for diaphoresis. Negative for fever and chills.       At church yesterday (see hpi)  HENT: Positive for congestion and hearing loss. Negative for sore throat.        Right ear  Respiratory: Positive for cough and sputum production. Negative for shortness of breath.        Now having more difficulty bringing up sputum since he finished mucinex  Cardiovascular: Negative for chest pain.  Skin: Negative for itching and rash.       Scarring still present and pain on right chest wall from shingles, vesicles resolved    Past Medical History  Diagnosis Date  . HTN (hypertension)   . Partial  deafness   . Clostridium difficile colitis   . GERD (gastroesophageal reflux disease)   . Allergy   . Hx of echocardiogram     a. Echo 11/12:  Mod LVH, EF 55-60%, MAC, mod LAE, mild RAE  . Depression   . Atrial flutter     a. dx after inguinal hernia repair in 10/12 => seen by Dr. Acie Fredrickson  . Arthritis     "right shoulder" (05/03/2012)  . Kidney stone     "they just passed" (05/03/2012)  . Allergic rhinitis due to pollen   . Pain in joint, pelvic region and thigh   . Complications affecting other specified body systems, hypertension   . Other specified cardiac dysrhythmias(427.89)   . Rash and other nonspecific skin eruption   . Sacroiliitis, not elsewhere classified   . Obesity, unspecified   . Inguinal hernia without mention of obstruction or gangrene, unilateral or unspecified, (not specified as recurrent)   . Intestinal infection due to Clostridium difficile   . Irritable bowel syndrome   . Special screening for malignant neoplasm of prostate   . Unspecified hereditary and idiopathic peripheral neuropathy   . Sacroiliitis, not elsewhere classified   . Spasm of muscle   . Cervicalgia   . Degeneration of cervical intervertebral disc   . Pain in joint,  ankle and foot   . Cervicalgia   . Pain in joint, shoulder region   . Nonspecific abnormal electrocardiogram (ECG) (EKG)   . Calculus of kidney   . Depressive disorder, not elsewhere classified   . Unspecified constipation   . Pain in joint, lower leg   . Anxiety state, unspecified   . Other malaise and fatigue   . Insomnia, unspecified   . Lumbago   . Coronary atherosclerosis of unspecified type of vessel, native or graft   . Nonspecific abnormal results of pulmonary system function study   . Impotence of organic origin   . Osteoarthrosis, unspecified whether generalized or localized, unspecified site     Past Surgical History  Procedure Laterality Date  . Appendectomy  ~ 1956  . Tonsillectomy  ~ 1946  . Renal cyst  excision      pt denies this hx on 05/03/2012  . Colonoscopy  06/06/2008    severe sigmoid diverticulosis and internal hemorrhoids  . Esophagogastroduodenoscopy  06/06/2008    normal  . Posterior lumbar fusion  1989  . Total hip arthroplasty  1999-2000    bilateral  . Carpal tunnel with cubital tunnel  2004    "right" (05/03/2012)  . Cardiac catheterization  08/1999; 05/03/2012    normal coronary arteries;   . Inguinal hernia repair  03/23/11    "left" (05/03/2012)  . Spine surgery  1969    Pantopaque Myelography and spinal infusion- Dr. Lyman Speller  . Reconstruction (r) foot surgery      DR SUE   . Right knee  [place]  . Atrial flutter ablation N/A 05/03/2012    Procedure: ATRIAL FLUTTER ABLATION;  Surgeon: Evans Lance, MD;  Location: [facility] CATH LAB;  Service: Cardiovascular;  Laterality: N/A;    Allergies  Allergen Reactions  . Amitriptyline   . Azithromycin     Stomach pain  . Flexeril [Cyclobenzaprine]     Causes dizziness   Medications: Patient's Medications  New Prescriptions   No medications on file  Previous Medications   ASPIRIN EC 81 MG TABLET    Take 1 tablet (81 mg total) by mouth daily.   GABAPENTIN (NEURONTIN) 100 MG CAPSULE    TAKE 1 CAPSULE (100 MG TOTAL) BY MOUTH 3 (THREE) TIMES DAILY AS NEEDED.   HYDROCHLOROTHIAZIDE (HYDRODIURIL) 25 MG TABLET    Take 1/2 tablet by mouth twice daily for blood pressure   HYDROCODONE-IBUPROFEN (VICOPROFEN) 7.5-200 MG PER TABLET    Take one tablet by mouth up to four times daily as needed for pain.   LOSARTAN-HYDROCHLOROTHIAZIDE (HYZAAR) 100-25 MG PER TABLET    TAKE ONE TABLET BY MOUTH ONCE DAILY TO CONTROL BLOOD PRESSURE   NEXIUM 40 MG CAPSULE       ONDANSETRON (ZOFRAN) 4 MG TABLET    TAKE 1 TABLET BY MOUTH EVERY 8 HOURS IF NEEDED FOR NAUSEA   OXYCODONE (ROXICODONE) 30 MG IMMEDIATE RELEASE TABLET    One every 3 hours if needed to control pain   VALACYCLOVIR (VALTREX) 1000 MG TABLET    Take 1 tablet (1,000 mg total) by mouth 3 (three)  times daily.   VITAMIN C (ASCORBIC ACID) 500 MG TABLET    Take 2,000 mg by mouth daily.   Modified Medications   No medications on file  Discontinued Medications   No medications on file    Physical Exam: Filed Vitals:   02/24/15 1532  BP: 130/80  Pulse: 85  Temp: 98.7 F (37.1 C)  TempSrc: Oral  Resp: 20  Height: 6\' 3"  (1.905 m)  Weight: 244 lb 6.4 oz (110.859 kg)  SpO2: 98%   Physical Exam  Constitutional: He appears well-developed and well-nourished. No distress.  HENT:  Head: Normocephalic and atraumatic.  Right Ear: External ear normal.  Left Ear: External ear normal.  Nose: Nose normal.  Mouth/Throat: Oropharynx is clear and moist. No oropharyngeal exudate.  Nasal sound to voice with congestion; right ear cerumen impacted  Eyes: Conjunctivae are normal.  Neck: Neck supple.  Pt feels warm  Cardiovascular: Normal rate, regular rhythm, normal heart sounds and intact distal pulses.   Pulmonary/Chest: Effort normal and breath sounds normal. No respiratory distress. He has no wheezes. He has no rales.  No audible rhonchi  Abdominal: Bowel sounds are normal.  Lymphadenopathy:    He has no cervical adenopathy.  Skin: Skin is warm and dry.  Scarring from his shingles remains on right chest wall    Labs reviewed: no recent bmp or cbc Lab Results  Component Value Date   HGBA1C * 08/10/2010    5.8 (NOTE)                                                                       According to the ADA Clinical Practice Recommendations for 2011, when HbA1c is used as a screening test:   >=6.5%   Diagnostic of Diabetes Mellitus           (if abnormal result  is confirmed)  5.7-6.4%   Increased risk of developing Diabetes Mellitus  References:Diagnosis and Classification of Diabetes Mellitus,Diabetes YIFO,2774,12(INOMV 1):S62-S69 and Standards of Medical Care in         Diabetes - 2011,Diabetes Care,2011,34  (Suppl 1):S11-S61.     Assessment/Plan 1. Acute recurrent maxillary  sinusitis (says he gets annually) - seems this has moved to early bronchitis -will treat with augmentin for 14 day and align antibiotics -also recommended yogurt, but he'd rather take a probiotic so given align samples also for 14 days - CBC with Differential/Platelet - Basic metabolic panel - amoxicillin-clavulanate (AUGMENTIN) 875-125 MG per tablet; Take 1 tablet by mouth 2 (two) times daily.  Dispense: 28 tablet; Refill: 0  2. Cerumen impaction, right -requested CMA to flush right ear -this is the ear he wears a hearing aide in   3. Postherpetic neuralgia -cont gabapentin 100mg  po tid, but will increase to 300mg  if his renal function allows when labs return  Labs/tests ordered:   Orders Placed This Encounter  Procedures  . CBC with Differential/Platelet  . Basic metabolic panel    Next appt:  Keep regular visit as scheduled with Dr. Nyoka Cowden  Tamecka Milham L. Debe Anfinson, D.O. [facility] [address], [place] [address] Cell Phone (Mon-Fri 8am-5pm):  [phone] On Call:  [phone] & follow prompts after 5pm & weekends Office Phone:  [phone] Office Fax:  [phone]

## 2015-02-25 LAB — BASIC METABOLIC PANEL
BUN/Creatinine Ratio: 16 (ref 10–22)
BUN: 22 mg/dL (ref 8–27)
CO2: 24 mmol/L (ref 18–29)
Calcium: 9.6 mg/dL (ref 8.6–10.2)
Chloride: 99 mmol/L (ref 97–108)
Creatinine, Ser: 1.41 mg/dL — ABNORMAL HIGH (ref 0.76–1.27)
GFR calc Af Amer: 56 mL/min/{1.73_m2} — ABNORMAL LOW (ref >59)
GFR calc non Af Amer: 48 mL/min/{1.73_m2} — ABNORMAL LOW (ref >59)
Glucose: 82 mg/dL (ref 65–99)
Potassium: 4.1 mmol/L (ref 3.5–5.2)
Sodium: 141 mmol/L (ref 134–144)

## 2015-02-25 LAB — CBC WITH DIFFERENTIAL/PLATELET
Basophils Absolute: 0.1 10*3/uL (ref 0.0–0.2)
Basos: 1 %
EOS (ABSOLUTE): 0.2 10*3/uL (ref 0.0–0.4)
Eos: 3 %
Hematocrit: 41 % (ref 37.5–51.0)
Hemoglobin: 14 g/dL (ref 12.6–17.7)
Immature Grans (Abs): 0 10*3/uL (ref 0.0–0.1)
Immature Granulocytes: 0 %
Lymphocytes Absolute: 1.3 10*3/uL (ref 0.7–3.1)
Lymphs: 20 %
MCH: 30.8 pg (ref 26.6–33.0)
MCHC: 34.1 g/dL (ref 31.5–35.7)
MCV: 90 fL (ref 79–97)
Monocytes Absolute: 0.8 10*3/uL (ref 0.1–0.9)
Monocytes: 11 %
Neutrophils Absolute: 4.4 10*3/uL (ref 1.4–7.0)
Neutrophils: 65 %
Platelets: 229 10*3/uL (ref 150–379)
RBC: 4.54 x10E6/uL (ref 4.14–5.80)
RDW: 14.2 % (ref 12.3–15.4)
WBC: 6.8 10*3/uL (ref 3.4–10.8)

## 2015-03-12 ENCOUNTER — Ambulatory Visit (INDEPENDENT_AMBULATORY_CARE_PROVIDER_SITE_OTHER): Payer: Medicare Other | Admitting: Internal Medicine

## 2015-03-12 ENCOUNTER — Encounter: Payer: Self-pay | Admitting: Internal Medicine

## 2015-03-12 VITALS — BP 138/80 | HR 71 | Temp 98.0°F | Resp 20 | Ht 75.0 in | Wt 252.6 lb

## 2015-03-12 DIAGNOSIS — B029 Zoster without complications: Secondary | ICD-10-CM

## 2015-03-12 DIAGNOSIS — B0223 Postherpetic polyneuropathy: Secondary | ICD-10-CM | POA: Insufficient documentation

## 2015-03-12 DIAGNOSIS — N182 Chronic kidney disease, stage 2 (mild): Secondary | ICD-10-CM | POA: Diagnosis not present

## 2015-03-12 DIAGNOSIS — I10 Essential (primary) hypertension: Secondary | ICD-10-CM | POA: Diagnosis not present

## 2015-03-12 DIAGNOSIS — F331 Major depressive disorder, recurrent, moderate: Secondary | ICD-10-CM | POA: Diagnosis not present

## 2015-03-12 NOTE — Progress Notes (Signed)
Patient ID: [name] Camp., male   DOB: [birth year], 74 y.o.   MRN: [id number]    Facility  [facility]    Place of Service:   OFFICE    Allergies  Allergen Reactions  . Amitriptyline   . Azithromycin     Stomach pain  . Flexeril [Cyclobenzaprine]     Causes dizziness    Chief Complaint  Patient presents with  . Medical Management of Chronic Issues    3 month follow-up for Hypertension    HPI:  Essential hypertension - controlled  Herpes zoster - there is no further activity of his herpes zoster. He has scarring of the right flank and a significant postherpetic neuropathy.  CKD (chronic kidney disease), stage 2 (mild) - unchanged  Post herpetic Neuropathy - pain is persistent and intermittent. He does not want to take anything more for it right now. Narcotics don't seem to help that much. He is aware that gabapentin or Lyrica may be helpful.  Depression, major, recurrent, moderate ([place]) - patient recognizes that he is depressed.Marland Kitchen He has anxiety problems. Despite the fact that he is in [employer], he wonders why God put so much misery in the world. He is sad in regards to his wife's continuing problems with pain related to her fibromyalgia. At the time of his last visit, I recommended increased physical activity. He joined the exercise facility, but has not set foot in it.    Medications: Patient's Medications  New Prescriptions   No medications on file  Previous Medications   ASPIRIN EC 81 MG TABLET    Take 1 tablet (81 mg total) by mouth daily.   HYDROCHLOROTHIAZIDE (HYDRODIURIL) 25 MG TABLET    Take 1/2 tablet by mouth twice daily for blood pressure   HYDROCODONE-IBUPROFEN (VICOPROFEN) 7.5-200 MG PER TABLET    Take one tablet by mouth up to four times daily as needed for pain.   LOSARTAN-HYDROCHLOROTHIAZIDE (HYZAAR) 100-25 MG PER TABLET    TAKE ONE TABLET BY MOUTH ONCE DAILY TO CONTROL BLOOD PRESSURE   ONDANSETRON (ZOFRAN) 4 MG TABLET    TAKE 1 TABLET BY MOUTH EVERY 8 HOURS  IF NEEDED FOR NAUSEA   OXYCODONE (ROXICODONE) 30 MG IMMEDIATE RELEASE TABLET    One every 3 hours if needed to control pain   VITAMIN C (ASCORBIC ACID) 500 MG TABLET    Take 2,000 mg by mouth daily.   Modified Medications   No medications on file  Discontinued Medications   AMOXICILLIN-CLAVULANATE (AUGMENTIN) 875-125 MG PER TABLET    Take 1 tablet by mouth 2 (two) times daily.   GABAPENTIN (NEURONTIN) 100 MG CAPSULE    TAKE 1 CAPSULE (100 MG TOTAL) BY MOUTH 3 (THREE) TIMES DAILY AS NEEDED.   NEXIUM 40 MG CAPSULE       VALACYCLOVIR (VALTREX) 1000 MG TABLET    Take 1 tablet (1,000 mg total) by mouth 3 (three) times daily.    Review of Systems  Constitutional: Positive for appetite change and fatigue. Negative for fever and chills.  HENT: Positive for hearing loss and sinus pressure. Negative for ear pain, trouble swallowing and voice change.   Eyes: Negative.   Cardiovascular: Negative.        Patient has hypertension.   Gastrointestinal: Negative for diarrhea, constipation, blood in stool and abdominal distention.  Endocrine: Negative.   Genitourinary: Negative.   Musculoskeletal:       Increased shoulder pains. Painful to raise or rotate shoulders.  Skin:  Painless atrophic plaque of the left forearm. Extensive herpes zoster right thoracic area.  Neurological: Negative.   Hematological: Negative.   Psychiatric/Behavioral: Positive for dysphoric mood. Negative for suicidal ideas and self-injury. The patient is nervous/anxious.     Filed Vitals:   03/12/15 1146  BP: 138/80  Pulse: 71  Temp: 98 F (36.7 C)  TempSrc: Oral  Resp: 20  Height: '6\' 3"'  (1.905 m)  Weight: 252 lb 9.6 oz (114.579 kg)  SpO2: 97%   Body mass index is 31.57 kg/(m^2).  Physical Exam  Constitutional: He is oriented to person, place, and time. He appears well-developed and well-nourished. No distress.  Moderately overweight  HENT:  Head: Normocephalic and atraumatic.  Significant partial deafness  bilaterally.  Eyes: Conjunctivae and EOM are normal. Pupils are equal, round, and reactive to light.  Neck: Normal range of motion. Neck supple. No JVD present. No tracheal deviation present. No thyromegaly present.  Cardiovascular: Normal rate, regular rhythm, normal heart sounds and intact distal pulses.  Exam reveals no gallop and no friction rub.   No murmur heard. Pulmonary/Chest: Effort normal and breath sounds normal. No respiratory distress. He has no wheezes. He has no rales. He exhibits no tenderness.  Abdominal: Soft. Bowel sounds are normal. He exhibits no distension and no mass. There is no tenderness.  Genitourinary: Rectum normal, prostate normal and penis normal. Guaiac negative stool.  Musculoskeletal: He exhibits no edema or tenderness.  Hammer toe right 3rd. Painful shoulders with elevation or rotation.  Lymphadenopathy:    He has no cervical adenopathy.  Neurological: He is alert and oriented to person, place, and time. No cranial nerve deficit.  Skin: Skin is dry. No rash noted. No erythema. No pallor.  Atrophic plaque of the left forearm. Painful herpetic rash of the right midthoracic region.  Psychiatric: His speech is normal and behavior is normal. Judgment and thought content normal. Cognition and memory are normal. He exhibits a depressed mood.  Anxiety and depressed mood.    Labs reviewed: Lab Summary Latest Ref Rng 02/24/2015  Hemoglobin 12.6 - 17.7 g/dL 14.0  Hematocrit 37.5 - 51.0 % 41.0  White count 3.4 - 10.8 x10E3/uL 6.8  Platelet count 150 - 379 x10E3/uL 229  Sodium 134 - 144 mmol/L 141  Potassium 3.5 - 5.2 mmol/L 4.1  Calcium 8.6 - 10.2 mg/dL 9.6  Phosphorus - (None)  Creatinine 0.76 - 1.27 mg/dL 1.41(H)  AST - (None)  Alk Phos - (None)  Bilirubin - (None)  Glucose 65 - 99 mg/dL 82  Cholesterol - (None)  HDL cholesterol - (None)  Triglycerides - (None)  LDL Direct - (None)  LDL Calc - (None)  Total protein - (None)  Albumin - (None)   Lab  Results  Component Value Date   TSH 1.57 03/22/2012   Lab Results  Component Value Date   BUN 22 02/24/2015   Lab Results  Component Value Date   HGBA1C * 08/10/2010    5.8 (NOTE)                                                                       According to the ADA Clinical Practice Recommendations for 2011, when HbA1c is used as a screening test:   >=6.5%  Diagnostic of Diabetes Mellitus           (if abnormal result  is confirmed)  5.7-6.4%   Increased risk of developing Diabetes Mellitus  References:Diagnosis and Classification of Diabetes Mellitus,Diabetes BFXO,3291,91(YOMAY 1):S62-S69 and Standards of Medical Care in         Diabetes - 2011,Diabetes Care,2011,34  (Suppl 1):S11-S61.    Assessment/Plan  1. Essential hypertension Continue current medication - Basic metabolic panel; Future  2. Herpes zoster Resolved except for the postherpetic neuropathy  3. CKD (chronic kidney disease), stage 2 (mild) - Basic metabolic panel; Future  4. Post herpetic Neuropathy Discussed use of gabapentin or Lyrica. He does not want to use these at this time.  5. Depression, major, recurrent, moderate ([place]) Patient does not feel that he needs an antidepressant yet.

## 2015-03-19 ENCOUNTER — Other Ambulatory Visit: Payer: Self-pay | Admitting: Internal Medicine

## 2015-03-21 ENCOUNTER — Other Ambulatory Visit: Payer: Self-pay | Admitting: *Deleted

## 2015-03-21 DIAGNOSIS — M12811 Other specific arthropathies, not elsewhere classified, right shoulder: Secondary | ICD-10-CM

## 2015-03-21 DIAGNOSIS — M75101 Unspecified rotator cuff tear or rupture of right shoulder, not specified as traumatic: Principal | ICD-10-CM

## 2015-03-21 MED ORDER — OXYCODONE HCL 30 MG PO TABS: ORAL_TABLET | ORAL | Status: DC

## 2015-04-21 ENCOUNTER — Other Ambulatory Visit: Payer: Self-pay

## 2015-04-21 DIAGNOSIS — M12811 Other specific arthropathies, not elsewhere classified, right shoulder: Secondary | ICD-10-CM

## 2015-04-21 DIAGNOSIS — M75101 Unspecified rotator cuff tear or rupture of right shoulder, not specified as traumatic: Principal | ICD-10-CM

## 2015-04-21 MED ORDER — OXYCODONE HCL 30 MG PO TABS: ORAL_TABLET | ORAL | Status: DC

## 2015-04-21 NOTE — Telephone Encounter (Signed)
Patient called for refill, last 03/21/15

## 2015-04-28 ENCOUNTER — Telehealth: Payer: Self-pay | Admitting: *Deleted

## 2015-04-28 ENCOUNTER — Encounter: Payer: Self-pay | Admitting: Internal Medicine

## 2015-04-28 NOTE — Telephone Encounter (Signed)
I will compose) out tomorrow when in the office.

## 2015-04-28 NOTE — Telephone Encounter (Signed)
Patient stated that he needs a letter for his [name] [name] stating that he is healthy enough to [employer]. Patient stated that he needed it as "Favorable" as possible. Needs labs attached with it and needs it before December 15th. Please Advise.

## 2015-04-28 NOTE — Telephone Encounter (Signed)
Tried calling patient again. LMOM to return call.

## 2015-04-28 NOTE — Telephone Encounter (Signed)
Patient called and left message on voicemail to return his call. Tried calling back and left message.

## 2015-04-29 NOTE — Telephone Encounter (Signed)
Letter printed and patient notified to pick up

## 2015-05-07 ENCOUNTER — Other Ambulatory Visit: Payer: Self-pay | Admitting: *Deleted

## 2015-05-07 DIAGNOSIS — M9903 Segmental and somatic dysfunction of lumbar region: Secondary | ICD-10-CM | POA: Diagnosis not present

## 2015-05-07 DIAGNOSIS — M4726 Other spondylosis with radiculopathy, lumbar region: Secondary | ICD-10-CM | POA: Diagnosis not present

## 2015-05-07 DIAGNOSIS — M25511 Pain in right shoulder: Secondary | ICD-10-CM | POA: Diagnosis not present

## 2015-05-07 MED ORDER — HYDROCODONE-IBUPROFEN 7.5-200 MG PO TABS: ORAL_TABLET | ORAL | Status: DC

## 2015-05-07 NOTE — Telephone Encounter (Signed)
Patient called and Requested Hydrocodone and will pick up

## 2015-05-12 DIAGNOSIS — M9903 Segmental and somatic dysfunction of lumbar region: Secondary | ICD-10-CM | POA: Diagnosis not present

## 2015-05-12 DIAGNOSIS — M4726 Other spondylosis with radiculopathy, lumbar region: Secondary | ICD-10-CM | POA: Diagnosis not present

## 2015-05-14 DIAGNOSIS — M4726 Other spondylosis with radiculopathy, lumbar region: Secondary | ICD-10-CM | POA: Diagnosis not present

## 2015-05-14 DIAGNOSIS — M9903 Segmental and somatic dysfunction of lumbar region: Secondary | ICD-10-CM | POA: Diagnosis not present

## 2015-05-23 DIAGNOSIS — M25552 Pain in left hip: Secondary | ICD-10-CM | POA: Diagnosis not present

## 2015-05-27 ENCOUNTER — Ambulatory Visit (INDEPENDENT_AMBULATORY_CARE_PROVIDER_SITE_OTHER): Payer: Medicare Other | Admitting: Internal Medicine

## 2015-05-27 ENCOUNTER — Encounter: Payer: Self-pay | Admitting: Internal Medicine

## 2015-05-27 VITALS — BP 138/84 | HR 64 | Temp 98.2°F | Ht 75.0 in | Wt 261.0 lb

## 2015-05-27 DIAGNOSIS — Z23 Encounter for immunization: Secondary | ICD-10-CM

## 2015-05-27 DIAGNOSIS — I10 Essential (primary) hypertension: Secondary | ICD-10-CM

## 2015-05-27 DIAGNOSIS — R350 Frequency of micturition: Secondary | ICD-10-CM | POA: Diagnosis not present

## 2015-05-27 MED ORDER — FINASTERIDE 5 MG PO TABS: ORAL_TABLET | ORAL | Status: DC

## 2015-05-27 NOTE — Addendum Note (Signed)
Addended by: Leigh Aurora C on: 05/27/2015 04:00 PM   Modules accepted: Orders

## 2015-05-27 NOTE — Progress Notes (Signed)
Patient ID: [name] Camp., male   DOB: [birth year], 74 y.o.   MRN: [id number]    Facility  [facility]    Place of Service:   OFFICE    Allergies  Allergen Reactions  . Amitriptyline   . Azithromycin     Stomach pain  . Flexeril [Cyclobenzaprine]     Causes dizziness    Chief Complaint  Patient presents with  . Acute Visit    Frequent Urination: patient c/o symptoms x couple of months. Patient denies pain or discomfort   . Immunizations    Prevnar 13, ? if ok to get today   . Advice    Patient needs advice on preventative measures to decrease chances of getting flu this season.     HPI:  Increased frequency for a few months. Some double voiding. Nocturia x 1. Urgency when he stands after sitting a while. Stream is strong. No dysuria. No hesitation or dribbling.   Medications: Patient's Medications  New Prescriptions   No medications on file  Previous Medications   ASPIRIN EC 81 MG TABLET    Take 1 tablet (81 mg total) by mouth daily.   HYDROCHLOROTHIAZIDE (HYDRODIURIL) 25 MG TABLET    Take 1/2 tablet by mouth twice daily for blood pressure   HYDROCODONE-IBUPROFEN (VICOPROFEN) 7.5-200 MG TABLET    Take one tablet by mouth up to four times daily as needed for pain.   LOSARTAN-HYDROCHLOROTHIAZIDE (HYZAAR) 100-25 MG PER TABLET    TAKE ONE TABLET BY MOUTH ONCE DAILY TO CONTROL BLOOD PRESSURE   ONDANSETRON (ZOFRAN) 4 MG TABLET    TAKE 1 TABLET BY MOUTH EVERY 8 HOURS IF NEEDED FOR NAUSEA   OXYCODONE (ROXICODONE) 30 MG IMMEDIATE RELEASE TABLET    One every 3 hours if needed to control pain   VITAMIN C (ASCORBIC ACID) 500 MG TABLET    Take 2,000 mg by mouth daily.   Modified Medications   No medications on file  Discontinued Medications   No medications on file    Review of Systems  Constitutional: Positive for appetite change and fatigue. Negative for fever and chills.  HENT: Positive for hearing loss and sinus pressure. Negative for ear pain, trouble swallowing and voice change.    Eyes: Negative.   Cardiovascular: Negative.        Patient has hypertension.   Gastrointestinal: Negative for diarrhea, constipation, blood in stool and abdominal distention.  Endocrine: Negative.   Genitourinary: Negative.   Musculoskeletal:       Increased shoulder pains. Painful to raise or rotate shoulders.  Skin:       Painless atrophic plaque of the left forearm. Extensive herpes zoster right thoracic area.  Neurological: Negative.   Hematological: Negative.   Psychiatric/Behavioral: Positive for dysphoric mood. Negative for suicidal ideas and self-injury. The patient is nervous/anxious.     Filed Vitals:   05/27/15 1516  BP: 138/84  Pulse: 64  Temp: 98.2 F (36.8 C)  TempSrc: Oral  Height: '6\' 3"'  (1.905 m)  Weight: 261 lb (118.389 kg)  SpO2: 95%   Body mass index is 32.62 kg/(m^2). Filed Weights   05/27/15 1516  Weight: 261 lb (118.389 kg)     Physical Exam  Constitutional: He is oriented to person, place, and time. He appears well-developed and well-nourished. No distress.  Moderately overweight  HENT:  Head: Normocephalic and atraumatic.  Significant partial deafness bilaterally.  Eyes: Conjunctivae and EOM are normal. Pupils are equal, round, and reactive to light.  Neck: Normal  range of motion. Neck supple. No JVD present. No tracheal deviation present. No thyromegaly present.  Cardiovascular: Normal rate, regular rhythm, normal heart sounds and intact distal pulses.  Exam reveals no gallop and no friction rub.   No murmur heard. Pulmonary/Chest: Effort normal and breath sounds normal. No respiratory distress. He has no wheezes. He has no rales. He exhibits no tenderness.  Abdominal: Soft. Bowel sounds are normal. He exhibits no distension and no mass. There is no tenderness.  Genitourinary: Rectum normal, prostate normal and penis normal. Guaiac negative stool.  Musculoskeletal: He exhibits no edema or tenderness.  Hammer toe right 3rd. Painful shoulders  with elevation or rotation.  Lymphadenopathy:    He has no cervical adenopathy.  Neurological: He is alert and oriented to person, place, and time. No cranial nerve deficit.  Skin: Skin is dry. No rash noted. No erythema. No pallor.  Atrophic plaque of the left forearm. Painful herpetic rash of the right midthoracic region.  Psychiatric: His speech is normal and behavior is normal. Judgment and thought content normal. Cognition and memory are normal. He exhibits a depressed mood.  Anxiety and depressed mood.    Labs reviewed: Lab Summary Latest Ref Rng 02/24/2015  Hemoglobin 12.6 - 17.7 g/dL 14.0  Hematocrit 37.5 - 51.0 % 41.0  White count 3.4 - 10.8 x10E3/uL 6.8  Platelet count 150 - 379 x10E3/uL 229  Sodium 134 - 144 mmol/L 141  Potassium 3.5 - 5.2 mmol/L 4.1  Calcium 8.6 - 10.2 mg/dL 9.6  Phosphorus - (None)  Creatinine 0.76 - 1.27 mg/dL 1.41(H)  AST - (None)  Alk Phos - (None)  Bilirubin - (None)  Glucose 65 - 99 mg/dL 82  Cholesterol - (None)  HDL cholesterol - (None)  Triglycerides - (None)  LDL Direct - (None)  LDL Calc - (None)  Total protein - (None)  Albumin - (None)   Lab Results  Component Value Date   TSH 1.57 03/22/2012   TSH 1.219 03/23/2011   TSH 0.792 08/10/2010   Lab Results  Component Value Date   BUN 22 02/24/2015   BUN 18 10/18/2013   BUN 22 07/25/2013   Lab Results  Component Value Date   HGBA1C * 08/10/2010    5.8 (NOTE)                                                                       According to the ADA Clinical Practice Recommendations for 2011, when HbA1c is used as a screening test:   >=6.5%   Diagnostic of Diabetes Mellitus           (if abnormal result  is confirmed)  5.7-6.4%   Increased risk of developing Diabetes Mellitus  References:Diagnosis and Classification of Diabetes Mellitus,Diabetes YTWK,4628,63(OTRRN 1):S62-S69 and Standards of Medical Care in         Diabetes - 2011,Diabetes Care,2011,34  (Suppl 1):S11-S61.     Assessment/Plan  1. Urinary frequency - POCT urinalysis dipstick==normal -Proscar 5 mg daily  2. Essential hypertension controlled

## 2015-06-03 ENCOUNTER — Other Ambulatory Visit: Payer: Self-pay | Admitting: Internal Medicine

## 2015-06-13 ENCOUNTER — Other Ambulatory Visit: Payer: Self-pay | Admitting: *Deleted

## 2015-06-13 DIAGNOSIS — M75101 Unspecified rotator cuff tear or rupture of right shoulder, not specified as traumatic: Principal | ICD-10-CM

## 2015-06-13 DIAGNOSIS — M12811 Other specific arthropathies, not elsewhere classified, right shoulder: Secondary | ICD-10-CM

## 2015-06-13 MED ORDER — OXYCODONE HCL 30 MG PO TABS: ORAL_TABLET | ORAL | Status: DC

## 2015-06-13 NOTE — Telephone Encounter (Signed)
Patient requested and will pick up 

## 2015-07-01 ENCOUNTER — Encounter: Payer: Self-pay | Admitting: Internal Medicine

## 2015-07-01 ENCOUNTER — Ambulatory Visit (INDEPENDENT_AMBULATORY_CARE_PROVIDER_SITE_OTHER): Payer: Medicare Other | Admitting: Internal Medicine

## 2015-07-01 VITALS — Temp 98.1°F | Resp 20 | Ht 75.0 in | Wt 255.6 lb

## 2015-07-01 DIAGNOSIS — M12811 Other specific arthropathies, not elsewhere classified, right shoulder: Secondary | ICD-10-CM | POA: Diagnosis not present

## 2015-07-01 DIAGNOSIS — M25519 Pain in unspecified shoulder: Secondary | ICD-10-CM | POA: Diagnosis not present

## 2015-07-01 DIAGNOSIS — I10 Essential (primary) hypertension: Secondary | ICD-10-CM | POA: Diagnosis not present

## 2015-07-01 DIAGNOSIS — R350 Frequency of micturition: Secondary | ICD-10-CM

## 2015-07-01 DIAGNOSIS — M75101 Unspecified rotator cuff tear or rupture of right shoulder, not specified as traumatic: Secondary | ICD-10-CM

## 2015-07-01 NOTE — Progress Notes (Signed)
Patient ID: [name] Camp., male   DOB: Jun 23, 1940, 75 y.o.   MRN: [id number]    Facility  [facility]    Place of Service:   OFFICE    Allergies  Allergen Reactions  . Amitriptyline   . Azithromycin     Stomach pain  . Flexeril [Cyclobenzaprine]     Causes dizziness    Chief Complaint  Patient presents with  . Medical Management of Chronic Issues    HPI:  Seen 05/26/16 with urinary frequency. Proscar started for suspected BPH symptoms.. Volume increased and frequency remained the same or worse, so he quit Proscar after a week. Now only voids a few times and urgency has improved. "Back to now".   Talked to ortho. Had scans of shoulder. Dr. Mardelle Matte says unrepairable. Gives cortisone. Right arm is less functional. Cannot abduct or rotate right shoulder. He wants to see someone else.  Medications: Patient's Medications  New Prescriptions   No medications on file  Previous Medications   ASPIRIN EC 81 MG TABLET    Take 1 tablet (81 mg total) by mouth daily.   FINASTERIDE (PROSCAR) 5 MG TABLET    One daily to help prostate   HYDROCHLOROTHIAZIDE (HYDRODIURIL) 25 MG TABLET    Take 1/2 tablet by mouth twice daily for blood pressure   HYDROCODONE-IBUPROFEN (VICOPROFEN) 7.5-200 MG TABLET    Take one tablet by mouth up to four times daily as needed for pain.   LOSARTAN-HYDROCHLOROTHIAZIDE (HYZAAR) 100-25 MG PER TABLET    TAKE ONE TABLET BY MOUTH ONCE DAILY TO CONTROL BLOOD PRESSURE   ONDANSETRON (ZOFRAN) 4 MG TABLET    TAKE 1 TABLET BY MOUTH EVERY 8 HOURS IF NEEDED FOR NAUSEA   OXYCODONE (ROXICODONE) 30 MG IMMEDIATE RELEASE TABLET    One every 3 hours if needed to control pain   VITAMIN C (ASCORBIC ACID) 500 MG TABLET    Take 2,000 mg by mouth daily.   Modified Medications   No medications on file  Discontinued Medications   No medications on file    Review of Systems  Constitutional: Positive for appetite change and fatigue. Negative for fever and chills.  HENT: Positive for hearing  loss. Negative for ear pain, sinus pressure, trouble swallowing and voice change.   Eyes: Negative.   Cardiovascular:       Hx hypertension.   Gastrointestinal: Negative for diarrhea, constipation, blood in stool and abdominal distention.  Endocrine: Negative.   Genitourinary: Negative.   Musculoskeletal:       Increased shoulder pains. Painful to raise or rotate shoulders.  Skin:       Painless atrophic plaque of the left forearm.  Neurological: Negative.   Hematological: Negative.   Psychiatric/Behavioral: Positive for dysphoric mood. Negative for suicidal ideas and self-injury. The patient is nervous/anxious.     Filed Vitals:   07/01/15 1415  Temp: 98.1 F (36.7 C)  TempSrc: Oral  Resp: 20  Height: _0  (1.905 m)  Weight: 255 lb 9.6 oz (115.939 kg)   Body mass index is 31.95 kg/(m^2). Filed Weights   07/01/15 1415  Weight: 255 lb 9.6 oz (115.939 kg)     Physical Exam  Constitutional: He is oriented to person, place, and time. He appears well-developed and well-nourished. No distress.  Moderately overweight  HENT:  Head: Normocephalic and atraumatic.  Significant partial deafness bilaterally.  Eyes: Conjunctivae and EOM are normal. Pupils are equal, round, and reactive to light.  Neck: Normal range of motion. Neck supple. No  JVD present. No tracheal deviation present. No thyromegaly present.  Cardiovascular: Normal rate, regular rhythm, normal heart sounds and intact distal pulses.  Exam reveals no gallop and no friction rub.   No murmur heard. Pulmonary/Chest: Effort normal and breath sounds normal. No respiratory distress. He has no wheezes. He has no rales. He exhibits no tenderness.  Abdominal: Soft. Bowel sounds are normal. He exhibits no distension and no mass. There is no tenderness.  Genitourinary: Rectum normal, prostate normal and penis normal. Guaiac negative stool.  Musculoskeletal: He exhibits no edema or tenderness.  Hammer toe right 3rd. Painful  shoulders with elevation or rotation.  Lymphadenopathy:    He has no cervical adenopathy.  Neurological: He is alert and oriented to person, place, and time. No cranial nerve deficit.  Skin: Skin is dry. No rash noted. No erythema. No pallor.  Atrophic plaque of the left forearm. Painful herpetic rash of the right midthoracic region.  Psychiatric: His speech is normal and behavior is normal. Judgment and thought content normal. Cognition and memory are normal. He exhibits a depressed mood.  Anxiety and depressed mood.    Labs reviewed: Lab Summary Latest Ref Rng 02/24/2015  Hemoglobin 12.6 - 17.7 g/dL 14.0  Hematocrit 37.5 - 51.0 % 41.0  White count 3.4 - 10.8 x10E3/uL 6.8  Platelet count 150 - 379 x10E3/uL 229  Sodium 134 - 144 mmol/L 141  Potassium 3.5 - 5.2 mmol/L 4.1  Calcium 8.6 - 10.2 mg/dL 9.6  Phosphorus - (None)  Creatinine 0.76 - 1.27 mg/dL 1.41(H)  AST - (None)  Alk Phos - (None)  Bilirubin - (None)  Glucose 65 - 99 mg/dL 82  Cholesterol - (None)  HDL cholesterol - (None)  Triglycerides - (None)  LDL Direct - (None)  LDL Calc - (None)  Total protein - (None)  Albumin - (None)   Lab Results  Component Value Date   TSH 1.57 03/22/2012   TSH 1.219 03/23/2011   TSH 0.792 08/10/2010   Lab Results  Component Value Date   BUN 22 02/24/2015   BUN 18 10/18/2013   BUN 22 07/25/2013   Lab Results  Component Value Date   HGBA1C * 08/10/2010    5.8 (NOTE)                                                                       According to the ADA Clinical Practice Recommendations for 2011, when HbA1c is used as a screening test:   >=6.5%   Diagnostic of Diabetes Mellitus           (if abnormal result  is confirmed)  5.7-6.4%   Increased risk of developing Diabetes Mellitus  References:Diagnosis and Classification of Diabetes Mellitus,Diabetes DIYM,4158,30(NMMHW 1):S62-S69 and Standards of Medical Care in         Diabetes - 2011,Diabetes Care,2011,34  (Suppl 1):S11-S61.      Assessment/Plan  1. Hypertension  Controlled.   2. Arthralgia of shoulder I will try to find out more about orthopedist that do these shoulder surgery and get back with the patient.   3. Rotator cuff tear arthropathy of right shoulder Seeking a new opinion regarding shoulder. He is unwilling at this time to accept the opinion that it cannot be fixed.  4. Frequency of urination Resolved. Off Proscar.

## 2015-07-10 ENCOUNTER — Ambulatory Visit (INDEPENDENT_AMBULATORY_CARE_PROVIDER_SITE_OTHER): Payer: Medicare Other | Admitting: Internal Medicine

## 2015-07-10 ENCOUNTER — Encounter: Payer: Self-pay | Admitting: Internal Medicine

## 2015-07-10 VITALS — BP 138/84 | HR 66 | Temp 98.0°F | Resp 20 | Ht 75.0 in | Wt 259.0 lb

## 2015-07-10 DIAGNOSIS — Z566 Other physical and mental strain related to work: Secondary | ICD-10-CM | POA: Diagnosis not present

## 2015-07-10 DIAGNOSIS — A084 Viral intestinal infection, unspecified: Secondary | ICD-10-CM

## 2015-07-10 NOTE — Progress Notes (Signed)
Patient ID: [name] Camp., male   DOB: Oct 16, 1940, 75 y.o.   MRN: [id number]   Location: [facility] Provider: Rexene Edison. Mariea Clonts, D.O., C.M.D. PCP:  Dr. Jeanmarie Hubert  Goals of Care: Advanced Directive information Advanced Directives 07/10/2015  Does patient have an advance directive? Yes  Type of Advance Directive [place]  Does patient want to make changes to advanced directive? No - Patient declined  Copy of advanced directive(s) in chart? No - copy requested  Would patient like information on creating an advanced directive? -    Chief Complaint  Patient presents with  . Acute Visit    Patients c/o Has had loose stools since yesterday  upset  stomach has not eaten in 24 hours    HPI: Patient is a 75 y.o. male seen in the office today for an acute visit for loose stools since yesterday, upset stomach with no intake for 24 hrs.   Feels like he has a bug in the lower intestinal tract.  Has had diarrhea.  Feels like he needs to expel more waste, but there's no more there.  Even a little water makes him nauseated.  5 loose stools last night.  No pain.  No blood.  No vomiting.  No sick contacts.  He is flushed and feels flushed.  Temp 98.    He has a h/o GI problems.  Knows he's not eating right. Is taking his probiotics.    Stress levels much worse.  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  Passed on 2022/07/04 and has had trouble unwinding afterwards.  Knows he has to do it again in a year.  Up 4-5 times per night.  His wife's fibromyalgia has also been keeping him up--she's agonizing in bed.     Review of Systems:  Review of Systems  Constitutional: Positive for malaise/fatigue. Negative for fever and chills.  Respiratory: Negative for shortness of breath.   Cardiovascular: Negative for chest pain.  Gastrointestinal: Positive for nausea and diarrhea. Negative for vomiting, abdominal  pain, constipation, blood in stool and melena.  Musculoskeletal: Negative for falls.  Neurological: Negative for dizziness and weakness.  Psychiatric/Behavioral: Negative for depression. The patient is nervous/anxious.     Past Medical History  Diagnosis Date  . HTN (hypertension)   . Partial deafness   . Clostridium difficile colitis   . GERD (gastroesophageal reflux disease)   . Allergy   . Hx of echocardiogram     a. Echo 11/12:  Mod LVH, EF 55-60%, MAC, mod LAE, mild RAE  . Depression   . Atrial flutter ([place])     a. dx after inguinal hernia repair in 10/12 => seen by Dr. Acie Fredrickson  . Arthritis     "right shoulder" (05/03/2012)  . Kidney stone     "they just passed" (05/03/2012)  . Allergic rhinitis due to pollen   . Pain in joint, pelvic region and thigh   . Complications affecting other specified body systems, hypertension   . Other specified cardiac dysrhythmias(427.89)   . Rash and other nonspecific skin eruption   . Sacroiliitis, not elsewhere classified ([place])   . Obesity, unspecified   . Inguinal hernia without mention of obstruction or gangrene, unilateral or unspecified, (not specified as recurrent)   . Intestinal infection due to Clostridium difficile   . Irritable bowel syndrome   . Special screening for malignant neoplasm of prostate   .  Unspecified hereditary and idiopathic peripheral neuropathy   . Sacroiliitis, not elsewhere classified ([place])   . Spasm of muscle   . Cervicalgia   . Degeneration of cervical intervertebral disc   . Pain in joint, ankle and foot   . Cervicalgia   . Pain in joint, shoulder region   . Nonspecific abnormal electrocardiogram (ECG) (EKG)   . Calculus of kidney   . Depressive disorder, not elsewhere classified   . Unspecified constipation   . Pain in joint, lower leg   . Anxiety state, unspecified   . Other malaise and fatigue   . Insomnia, unspecified   . Lumbago   . Coronary atherosclerosis of unspecified type of vessel, native  or graft   . Nonspecific abnormal results of pulmonary system function study   . Impotence of organic origin   . Osteoarthrosis, unspecified whether generalized or localized, unspecified site     Past Surgical History  Procedure Laterality Date  . Appendectomy  ~ 1956  . Tonsillectomy  ~ 1946  . Renal cyst excision      pt denies this hx on 05/03/2012  . Colonoscopy  06/06/2008    severe sigmoid diverticulosis and internal hemorrhoids  . Esophagogastroduodenoscopy  06/06/2008    normal  . Posterior lumbar fusion  1989  . Total hip arthroplasty  1999-2000    bilateral  . Carpal tunnel with cubital tunnel  2004    "right" (05/03/2012)  . Cardiac catheterization  08/1999; 05/03/2012    normal coronary arteries;   . Inguinal hernia repair  03/23/11    "left" (05/03/2012)  . Spine surgery  1969    Pantopaque Myelography and spinal infusion- Dr. Lyman Speller  . Reconstruction (r) foot surgery      DR SUE   . Right knee  [place]  . Atrial flutter ablation N/A 05/03/2012    Procedure: ATRIAL FLUTTER ABLATION;  Surgeon: Evans Lance, MD;  Location[facility] C[facility] LAB;  Service: Cardiovascular;  Laterality: N/A;    Allergies  Allergen Reactions  . Amitriptyline   . Azithromycin     Stomach pain  . Flexeril [Cyclobenzaprine]     Causes dizziness      Medication List       This list is accurate as of: 07/10/15  2:57 PM.  Always use your most recent med list.               aspirin EC 81 MG tablet  Take 1 tablet (81 mg total) by mouth daily.     finasteride 5 MG tablet  Commonly known as:  PROSCAR  One daily to help prostate     hydrochlorothiazide 25 MG tablet  Commonly known as:  HYDRODIURIL  Take 1/2 tablet by mouth twice daily for blood pressure     HYDROcodone-ibuprofen 7.5-200 MG tablet  Commonly known as:  VICOPROFEN  Take one tablet by mouth up to four times daily as needed for pain.     losartan-hydrochlorothiazide 100-25 MG tablet  Commonly known as:  HYZAAR  TAKE ONE  TABLET BY MOUTH ONCE DAILY TO CONTROL BLOOD PRESSURE     ondansetron 4 MG tablet  Commonly known as:  ZOFRAN  TAKE 1 TABLET BY MOUTH EVERY 8 HOURS IF NEEDED FOR NAUSEA     oxycodone 30 MG immediate release tablet  Commonly known as:  ROXICODONE  One every 3 hours if needed to control pain     vitamin C 500 MG tablet  Commonly known as:  ASCORBIC ACID  Take 2,000 mg by mouth daily.        Physical Exam: Filed Vitals:   07/10/15 1453  Pulse: 66  Temp: 98 F (36.7 C)  TempSrc: Oral  Resp: 20  Weight: 259 lb (117.482 kg)  SpO2: 97%   Body mass index is 32.37 kg/(m^2). Physical Exam  Constitutional: He is oriented to person, place, and time. He appears well-developed and well-nourished. No distress.  Flushed appearance  Cardiovascular: Normal rate, regular rhythm and normal heart sounds.   Pulmonary/Chest: Effort normal and breath sounds normal. No respiratory distress.  Abdominal: Soft. Bowel sounds are normal. He exhibits no distension and no mass. There is no tenderness. There is no rebound and no guarding.  Musculoskeletal: Normal range of motion.  Neurological: He is alert and oriented to person, place, and time.  Skin: Skin is warm and dry.  Psychiatric: He has a normal mood and affect.    Labs reviewed: Basic Metabolic Panel:  Recent Labs  02/24/15 1618  NA 141  K 4.1  CL 99  CO2 24  GLUCOSE 82  BUN 22  CREATININE 1.41*  CALCIUM 9.6   Liver Function Tests: No results for input(s): AST, ALT, ALKPHOS, BILITOT, PROT, ALBUMIN in the last 8760 hours. No results for input(s): LIPASE, AMYLASE in the last 8760 hours. No results for input(s): AMMONIA in the last 8760 hours. CBC:  Recent Labs  02/24/15 1618  WBC 6.8  NEUTROABS 4.4  HCT 41.0  MCV 90  PLT 229   Lipid Panel: No results for input(s): CHOL, HDL, LDLCALC, TRIG, CHOLHDL, LDLDIRECT in the last 8760 hours. Lab Results  Component Value Date   HGBA1C * 08/10/2010    5.8 (NOTE)                                                                        According to the ADA Clinical Practice Recommendations for 2011, when HbA1c is used as a screening test:   >=6.5%   Diagnostic of Diabetes Mellitus           (if abnormal result  is confirmed)  5.7-6.4%   Increased risk of developing Diabetes Mellitus  References:Diagnosis and Classification of Diabetes Mellitus,Diabetes [id number] 1):S62-S69 and Standards of Medical Care in         Diabetes - 2011,Diabetes Care,2011,34  (Suppl 1):S11-S61.   Assessment/Plan 1. Viral gastroenteritis -counseled on gradual liquids with vitamin water or gatorade one bottle very slowly over several hours, then gradually add broth and then move to [employer]; as nausea and diarrhea resolve, may advance as tolerated -if unable to keep anything inside for more than 48 hrs, becomes weak, unable to perform self-care, should report to the ED or urgent care for IV hydration  2. 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   Next appt: prn   Gloris Shiroma L. Goldie Dimmer, D.O. [facility] [address],  [place] [address] Cell Phone (Mon-Fri 8am-5pm):  [phone] On Call:  [phone] & follow prompts after 5pm & weekends Office Phone:  [phone] Office Fax:  [phone]

## 2015-07-10 NOTE — Patient Instructions (Signed)
Try to drink either vitamin water or gatorade very gradually over a few hours. Then begin clear liquids like broth.   Then try more solid carb foods as below:  Food Choices to Help Relieve Diarrhea, Adult When you have diarrhea, the foods you eat and your eating habits are very important. Choosing the right foods and drinks can help relieve diarrhea. Also, because diarrhea can last up to 7 days, you need to replace lost fluids and electrolytes (such as sodium, potassium, and chloride) in order to help prevent dehydration.  WHAT GENERAL GUIDELINES DO I NEED TO FOLLOW?  Slowly drink 1 cup (8 oz) of fluid for each episode of diarrhea. If you are getting enough fluid, your urine will be clear or pale yellow.  Eat starchy foods. Some good choices include white rice, white toast, pasta, low-fiber cereal, baked potatoes (without the skin), saltine crackers, and bagels.  Avoid large servings of any cooked vegetables.  Limit fruit to two servings per day. A serving is  cup or 1 small piece.  Choose foods with less than 2 g of fiber per serving.  Limit fats to less than 8 tsp (38 g) per day.  Avoid fried foods.  Eat foods that have probiotics in them. Probiotics can be found in certain dairy products.  Avoid foods and beverages that may increase the speed at which food moves through the stomach and intestines (gastrointestinal tract). Things to avoid include:  High-fiber foods, such as dried fruit, raw fruits and vegetables, nuts, seeds, and whole grain foods.  Spicy foods and high-fat foods.  Foods and beverages sweetened with high-fructose corn syrup, honey, or sugar alcohols such as xylitol, sorbitol, and mannitol. WHAT FOODS ARE RECOMMENDED? Grains White rice. White, [place], or pita breads (fresh or toasted), including plain rolls, buns, or bagels. White pasta. Saltine, soda, or graham crackers. Pretzels. Low-fiber cereal. Cooked cereals made with water (such as cornmeal, farina, or cream  cereals). Plain muffins. Matzo. Melba toast. Zwieback.  Vegetables Potatoes (without the skin). Strained tomato and vegetable juices. Most well-cooked and canned vegetables without seeds. Tender lettuce. Fruits Cooked or canned applesauce, apricots, cherries, fruit cocktail, grapefruit, peaches, pears, or plums. Fresh bananas, apples without skin, cherries, grapes, cantaloupe, grapefruit, peaches, oranges, or plums.  Meat and Other Protein Products Baked or boiled chicken. Eggs. Tofu. Fish. Seafood. Smooth peanut butter. Ground or well-cooked tender beef, ham, veal, lamb, pork, or poultry.  Dairy Plain yogurt, kefir, and unsweetened liquid yogurt. Lactose-free milk, buttermilk, or soy milk. Plain hard cheese. Beverages Sport drinks. Clear broths. Diluted fruit juices (except prune). Regular, caffeine-free sodas such as ginger ale. Water. Decaffeinated teas. Oral rehydration solutions. Sugar-free beverages not sweetened with sugar alcohols. Other Bouillon, broth, or soups made from recommended foods.  The items listed above may not be a complete list of recommended foods or beverages. Contact your dietitian for more options. WHAT FOODS ARE NOT RECOMMENDED? Grains Whole grain, whole wheat, bran, or rye breads, rolls, pastas, crackers, and cereals. Wild or brown rice. Cereals that contain more than 2 g of fiber per serving. Corn tortillas or taco shells. Cooked or dry oatmeal. Granola. Popcorn. Vegetables Raw vegetables. Cabbage, broccoli, Brussels sprouts, artichokes, baked beans, beet greens, corn, kale, legumes, peas, sweet potatoes, and yams. Potato skins. Cooked spinach and cabbage. Fruits Dried fruit, including raisins and dates. Raw fruits. Stewed or dried prunes. Fresh apples with skin, apricots, mangoes, pears, raspberries, and strawberries.  Meat and Other Protein Products Chunky peanut butter. Nuts and seeds. Beans  and lentils. Berniece Salines.  Dairy High-fat cheeses. Milk, chocolate milk, and  beverages made with milk, such as milk shakes. Cream. Ice cream. Sweets and Desserts Sweet rolls, doughnuts, and sweet breads. Pancakes and waffles. Fats and Oils Butter. Cream sauces. Margarine. Salad oils. Plain salad dressings. Olives. Avocados.  Beverages Caffeinated beverages (such as coffee, tea, soda, or energy drinks). Alcoholic beverages. Fruit juices with pulp. Prune juice. Soft drinks sweetened with high-fructose corn syrup or sugar alcohols. Other Coconut. Hot sauce. Chili powder. Mayonnaise. Gravy. Cream-based or milk-based soups.  The items listed above may not be a complete list of foods and beverages to avoid. Contact your dietitian for more information. WHAT SHOULD I DO IF I BECOME DEHYDRATED? Diarrhea can sometimes lead to dehydration. Signs of dehydration include dark urine and dry mouth and skin. If you think you are dehydrated, you should rehydrate with an oral rehydration solution. These solutions can be purchased at pharmacies, retail stores, or online.  Drink -1 cup (120-240 mL) of oral rehydration solution each time you have an episode of diarrhea. If drinking this amount makes your diarrhea worse, try drinking smaller amounts more often. For example, drink 1-3 tsp (5-15 mL) every 5-10 minutes.  A general rule for staying hydrated is to drink 1-2 L of fluid per day. Talk to your health care provider about the specific amount you should be drinking each day. Drink enough fluids to keep your urine clear or pale yellow.   This information is not intended to replace advice given to you by your health care provider. Make sure you discuss any questions you have with your health care provider.   Document Released: 08/07/2003 Document Revised: 06/07/2014 Document Reviewed: 04/09/2013 Elsevier Interactive Patient Education [employer].  If you are not able to drink or eat by Sunday or you are unable to walk around due to severe weakness, I recommend  you go to the  emergency room for IV fluids.

## 2015-07-11 ENCOUNTER — Other Ambulatory Visit: Payer: Medicare Other

## 2015-07-11 ENCOUNTER — Other Ambulatory Visit: Payer: Self-pay | Admitting: *Deleted

## 2015-07-11 DIAGNOSIS — M12811 Other specific arthropathies, not elsewhere classified, right shoulder: Secondary | ICD-10-CM

## 2015-07-11 DIAGNOSIS — M75101 Unspecified rotator cuff tear or rupture of right shoulder, not specified as traumatic: Principal | ICD-10-CM

## 2015-07-11 MED ORDER — OXYCODONE HCL 30 MG PO TABS: ORAL_TABLET | ORAL | Status: DC

## 2015-07-11 NOTE — Telephone Encounter (Signed)
Patient wife requested and will pick up 

## 2015-07-15 ENCOUNTER — Ambulatory Visit: Payer: Medicare Other | Admitting: Internal Medicine

## 2015-07-31 ENCOUNTER — Other Ambulatory Visit: Payer: Self-pay | Admitting: Internal Medicine

## 2015-07-31 DIAGNOSIS — H9123 Sudden idiopathic hearing loss, bilateral: Secondary | ICD-10-CM | POA: Diagnosis not present

## 2015-07-31 DIAGNOSIS — H903 Sensorineural hearing loss, bilateral: Secondary | ICD-10-CM | POA: Diagnosis not present

## 2015-08-01 ENCOUNTER — Telehealth: Payer: Self-pay | Admitting: *Deleted

## 2015-08-01 DIAGNOSIS — H9193 Unspecified hearing loss, bilateral: Secondary | ICD-10-CM

## 2015-08-01 NOTE — Telephone Encounter (Signed)
Patient stated that he has had some hearing loss and went to [facility] to be evaluated. They told him it was just significant hearing loss and not sure why. Patient is wanting a second opinion and wants to be evaluated by a specialist and needs referral. Please Advise.

## 2015-08-04 NOTE — Telephone Encounter (Signed)
See if Dr. Ernesto Rutherford would be willing to see him.

## 2015-08-04 NOTE — Telephone Encounter (Signed)
Referral Placed 

## 2015-08-05 ENCOUNTER — Other Ambulatory Visit: Payer: Self-pay | Admitting: Internal Medicine

## 2015-08-05 MED ORDER — CETIRIZINE-PSEUDOEPHEDRINE ER 5-120 MG PO TB12: ORAL_TABLET | ORAL | Status: DC

## 2015-08-06 ENCOUNTER — Other Ambulatory Visit: Payer: Self-pay | Admitting: *Deleted

## 2015-08-06 DIAGNOSIS — M12811 Other specific arthropathies, not elsewhere classified, right shoulder: Secondary | ICD-10-CM

## 2015-08-06 DIAGNOSIS — M75101 Unspecified rotator cuff tear or rupture of right shoulder, not specified as traumatic: Principal | ICD-10-CM

## 2015-08-06 MED ORDER — OXYCODONE HCL 30 MG PO TABS: ORAL_TABLET | ORAL | Status: DC

## 2015-08-06 NOTE — Telephone Encounter (Signed)
Patient requested and will pick up. Patient going out of town for the weekend.

## 2015-08-11 ENCOUNTER — Other Ambulatory Visit: Payer: Self-pay | Admitting: *Deleted

## 2015-08-11 MED ORDER — HYDROCODONE-IBUPROFEN 7.5-200 MG PO TABS: ORAL_TABLET | ORAL | Status: DC

## 2015-08-11 NOTE — Telephone Encounter (Signed)
Patient wife requested and will pick up 

## 2015-08-27 ENCOUNTER — Telehealth: Payer: Self-pay

## 2015-08-27 NOTE — Telephone Encounter (Signed)
Patient aware of recommendation.  

## 2015-08-27 NOTE — Telephone Encounter (Signed)
[facility] [address].

## 2015-08-27 NOTE — Telephone Encounter (Signed)
Patient called c/o plantars warts on feet. Patient states these warts are painful and he would like Dr.Green's recommendations for a podiatrist. Last OV 07-10-15, pending OV 09-30-15  Please advise

## 2015-09-01 ENCOUNTER — Encounter: Payer: Medicare Other | Admitting: Podiatry

## 2015-09-02 ENCOUNTER — Telehealth: Payer: Self-pay

## 2015-09-02 NOTE — Telephone Encounter (Signed)
Come at [facility] on 09/03/15

## 2015-09-02 NOTE — Telephone Encounter (Signed)
Patient aware to come in for appointment tomorrow @ 11:00 am. Earlie Server please place Mr.Greeley on Dr.Greens schedule for tomorrow (slot will have to be created)

## 2015-09-02 NOTE — Telephone Encounter (Signed)
Patient sliced left arm on a sharpe edged object 6 days ago, wound is not healing and continues to bleed. Patient states " It looks really bad" patient went to urgent care on Sunday and it was a hour and 1/2 wait and everyone looked sick with the flu so patient left without being seen. Patient is now concerned because area is not healing and continues to bleed. Patient aware we have no available appointment's for the week  Tetanus vaccine is up to date - 2013  Please advise

## 2015-09-03 ENCOUNTER — Ambulatory Visit (INDEPENDENT_AMBULATORY_CARE_PROVIDER_SITE_OTHER): Payer: Medicare Other | Admitting: Internal Medicine

## 2015-09-03 ENCOUNTER — Encounter: Payer: Self-pay | Admitting: Internal Medicine

## 2015-09-03 ENCOUNTER — Ambulatory Visit: Payer: Medicare Other | Admitting: Podiatry

## 2015-09-03 VITALS — BP 188/96 | HR 86 | Temp 98.1°F | Ht 75.6 in | Wt 262.0 lb

## 2015-09-03 DIAGNOSIS — S41112A Laceration without foreign body of left upper arm, initial encounter: Secondary | ICD-10-CM

## 2015-09-03 DIAGNOSIS — G4700 Insomnia, unspecified: Secondary | ICD-10-CM | POA: Diagnosis not present

## 2015-09-03 DIAGNOSIS — S41119A Laceration without foreign body of unspecified upper arm, initial encounter: Secondary | ICD-10-CM | POA: Insufficient documentation

## 2015-09-03 MED ORDER — MUPIROCIN 2 % EX OINT: TOPICAL_OINTMENT | CUTANEOUS | Status: DC

## 2015-09-03 MED ORDER — TEMAZEPAM 15 MG PO CAPS: 15.0000 mg | ORAL_CAPSULE | Freq: Every evening | ORAL | Status: DC | PRN

## 2015-09-03 NOTE — Progress Notes (Signed)
Patient ID: [name] Camp., male   DOB: 1941/03/24, 75 y.o.   MRN: [id number]   [facility] clinic  Provider: Jeanmarie Hubert, MD  Code Status: full Goals of Care:  Advanced Directives 09/03/2015  Does patient have an advance directive? Yes  Type of Paramedic of [place];Living will  Does patient want to make changes to advanced directive? -  Copy of advanced directive(s) in chart? No - copy requested     Chief Complaint  Patient presents with  . Acute Visit    on 08/27/15 cut left lower arm, wound is not healing continues to bleed. Last Tetanus 06/02/11    HPI: Patient is a 75 y.o. male seen today for an acute visit for poorly healing cut on the left lower arm. Oozing yellow purulent material until last night.  Continues to complain of insomnia. Melatonin does not work for him.  Past Medical History  Diagnosis Date  . HTN (hypertension)   . Partial deafness   . Clostridium difficile colitis   . GERD (gastroesophageal reflux disease)   . Allergy   . Hx of echocardiogram     a. Echo 11/12:  Mod LVH, EF 55-60%, MAC, mod LAE, mild RAE  . Depression   . Atrial flutter ([place])     a. dx after inguinal hernia repair in 10/12 => seen by Dr. Acie Fredrickson  . Arthritis     "right shoulder" (05/03/2012)  . Kidney stone     "they just passed" (05/03/2012)  . Allergic rhinitis due to pollen   . Pain in joint, pelvic region and thigh   . Complications affecting other specified body systems, hypertension   . Other specified cardiac dysrhythmias(427.89)   . Rash and other nonspecific skin eruption   . Sacroiliitis, not elsewhere classified ([place])   . Obesity, unspecified   . Inguinal hernia without mention of obstruction or gangrene, unilateral or unspecified, (not specified as recurrent)   . Intestinal infection due to Clostridium difficile   . Irritable bowel syndrome   . Special screening for malignant neoplasm of prostate   . Unspecified hereditary and idiopathic peripheral  neuropathy   . Sacroiliitis, not elsewhere classified ([place])   . Spasm of muscle   . Cervicalgia   . Degeneration of cervical intervertebral disc   . Pain in joint, ankle and foot   . Cervicalgia   . Pain in joint, shoulder region   . Nonspecific abnormal electrocardiogram (ECG) (EKG)   . Calculus of kidney   . Depressive disorder, not elsewhere classified   . Unspecified constipation   . Pain in joint, lower leg   . Anxiety state, unspecified   . Other malaise and fatigue   . Insomnia, unspecified   . Lumbago   . Coronary atherosclerosis of unspecified type of vessel, native or graft   . Nonspecific abnormal results of pulmonary system function study   . Impotence of organic origin   . Osteoarthrosis, unspecified whether generalized or localized, unspecified site     Past Surgical History  Procedure Laterality Date  . Appendectomy  ~ 1956  . Tonsillectomy  ~ 1946  . Renal cyst excision      pt denies this hx on 05/03/2012  . Colonoscopy  06/06/2008    severe sigmoid diverticulosis and internal hemorrhoids  . Esophagogastroduodenoscopy  06/06/2008    normal  . Posterior lumbar fusion  1989  . Total hip arthroplasty  1999-2000    bilateral  . Carpal tunnel with cubital tunnel  2004    "right" (05/03/2012)  . Cardiac catheterization  08/1999; 05/03/2012    normal coronary arteries;   . Inguinal hernia repair  03/23/11    "left" (05/03/2012)  . Spine surgery  1969    Pantopaque Myelography and spinal infusion- Dr. Lyman Speller  . Reconstruction (r) foot surgery      DR SUE   . Right knee  [place]  . Atrial flutter ablation N/A 05/03/2012    Procedure: ATRIAL FLUTTER ABLATION;  Surgeon: Evans Lance, MD;  Location: [facility] CATH LAB;  Service: Cardiovascular;  Laterality: N/A;    Allergies  Allergen Reactions  . Amitriptyline   . Azithromycin     Stomach pain  . Flexeril [Cyclobenzaprine]     Causes dizziness      Medication List       This list is accurate as of: 09/03/15  10:59 AM.  Always use your most recent med list.               aspirin EC 81 MG tablet  Take 1 tablet (81 mg total) by mouth daily.     cetirizine-pseudoephedrine 5-120 MG tablet  Commonly known as:  ZYRTEC-D  One twice daily to help congestion     finasteride 5 MG tablet  Commonly known as:  PROSCAR  One daily to help prostate     hydrochlorothiazide 25 MG tablet  Commonly known as:  HYDRODIURIL  Take 1/2 tablet by mouth twice daily for blood pressure     HYDROcodone-ibuprofen 7.5-200 MG tablet  Commonly known as:  VICOPROFEN  Take one tablet by mouth up to four times daily as needed for pain.     losartan-hydrochlorothiazide 100-25 MG tablet  Commonly known as:  HYZAAR  TAKE ONE TABLET BY MOUTH ONCE DAILY TO CONTROL BLOOD PRESSURE     ondansetron 4 MG tablet  Commonly known as:  ZOFRAN  TAKE 1 TABLET BY MOUTH EVERY 8 HOURS AS NEEDED FOR NAUSEA     oxycodone 30 MG immediate release tablet  Commonly known as:  ROXICODONE  One every 3 hours if needed to control pain     vitamin C 500 MG tablet  Commonly known as:  ASCORBIC ACID  Take 2,000 mg by mouth daily.        Review of Systems:  Review of Systems  Constitutional: Positive for appetite change and fatigue. Negative for fever and chills.  HENT: Positive for hearing loss. Negative for ear pain, sinus pressure, trouble swallowing and voice change.   Eyes: Negative.   Cardiovascular:       Hx hypertension.   Gastrointestinal: Negative for diarrhea, constipation, blood in stool and abdominal distention.  Endocrine: Negative.   Genitourinary: Negative.   Musculoskeletal:       Increased shoulder pains. Painful to raise or rotate shoulders.  Skin:       Painless atrophic plaque of the left forearm. Skin tear/laceration of the left forearm mid March 2017. Initially had some periodic discharge, but it seemed to be improving since 09/02/2015.  Neurological: Negative.   Hematological: Negative.     Psychiatric/Behavioral: Positive for sleep disturbance (Insomnia) and dysphoric mood. Negative for suicidal ideas and self-injury. The patient is nervous/anxious.     Health Maintenance  Topic Date Due  . COLONOSCOPY  01/23/1990  . INFLUENZA VACCINE  12/30/2015  . TETANUS/TDAP  06/01/2021  . ZOSTAVAX  Completed  . PNA vac Low Risk Adult  Completed    Physical Exam: Filed Vitals:  09/03/15 1049  BP: 188/96  Pulse: 86  Temp: 98.1 F (36.7 C)  TempSrc: Oral  Height: 6' 3.6" (1.92 m)  Weight: 262 lb (118.842 kg)  SpO2: 96%   Body mass index is 32.24 kg/(m^2). Physical Exam  Constitutional: He is oriented to person, place, and time. He appears well-developed and well-nourished. No distress.  Moderately overweight  HENT:  Head: Normocephalic and atraumatic.  Significant partial deafness bilaterally.  Eyes: Conjunctivae and EOM are normal. Pupils are equal, round, and reactive to light.  Neck: Normal range of motion. Neck supple. No JVD present. No tracheal deviation present. No thyromegaly present.  Cardiovascular: Normal rate, regular rhythm, normal heart sounds and intact distal pulses.  Exam reveals no gallop and no friction rub.   No murmur heard. Pulmonary/Chest: Effort normal and breath sounds normal. No respiratory distress. He has no wheezes. He has no rales. He exhibits no tenderness.  Abdominal: Soft. Bowel sounds are normal. He exhibits no distension and no mass. There is no tenderness.  Genitourinary: Rectum normal, prostate normal and penis normal. Guaiac negative stool.  Musculoskeletal: He exhibits no edema or tenderness.  Hammer toe right 3rd. Painful shoulders with elevation or rotation.  Lymphadenopathy:    He has no cervical adenopathy.  Neurological: He is alert and oriented to person, place, and time. No cranial nerve deficit.  Skin: Skin is dry. No rash noted. No erythema. No pallor.  Atrophic plaque of the left forearm. Skin tear/laceration of the  left forearm. Surrounding bruising. No purulence. Irritation of the skin surrounding this wound secondary to either bandages or Neosporin reaction.  Psychiatric: His speech is normal and behavior is normal. Judgment and thought content normal. Cognition and memory are normal. He exhibits a depressed mood.  Anxiety and depressed mood.    Labs reviewed: Basic Metabolic Panel:  Recent Labs  02/24/15 1618  NA 141  K 4.1  CL 99  CO2 24  GLUCOSE 82  BUN 22  CREATININE 1.41*  CALCIUM 9.6   Liver Function Tests: No results for input(s): AST, ALT, ALKPHOS, BILITOT, PROT, ALBUMIN in the last 8760 hours. No results for input(s): LIPASE, AMYLASE in the last 8760 hours. No results for input(s): AMMONIA in the last 8760 hours. CBC:  Recent Labs  02/24/15 1618  WBC 6.8  NEUTROABS 4.4  HCT 41.0  MCV 90  PLT 229   Lipid Panel: No results for input(s): CHOL, HDL, LDLCALC, TRIG, CHOLHDL, LDLDIRECT in the last 8760 hours. Lab Results  Component Value Date   HGBA1C * 08/10/2010    5.8 (NOTE)                                                                       According to the ADA Clinical Practice Recommendations for 2011, when HbA1c is used as a screening test:   >=6.5%   Diagnostic of Diabetes Mellitus           (if abnormal result  is confirmed)  5.7-6.4%   Increased risk of developing Diabetes Mellitus  References:Diagnosis and Classification of Diabetes Mellitus,Diabetes [id number] 1):S62-S69 and Standards of Medical Care in         Diabetes - 2011,Diabetes Care,2011,34  (Suppl 1):S11-S61.     Assessment/Plan  1.  Laceration of arm, left, initial encounter -Cleanse daily with soap and water then apply mupirocin 2% ointment until healed  2. Insomnia - temazepam (RESTORIL) 15 MG capsule; Take 1 capsule (15 mg total) by mouth at bedtime as needed for sleep.  Dispense: 30 capsule; Refill: 0   Next appt:  09/26/2015

## 2015-09-06 ENCOUNTER — Other Ambulatory Visit: Payer: Self-pay | Admitting: Internal Medicine

## 2015-09-10 ENCOUNTER — Ambulatory Visit: Payer: Medicare Other | Admitting: Podiatry

## 2015-09-15 DIAGNOSIS — M4726 Other spondylosis with radiculopathy, lumbar region: Secondary | ICD-10-CM | POA: Diagnosis not present

## 2015-09-15 DIAGNOSIS — M9903 Segmental and somatic dysfunction of lumbar region: Secondary | ICD-10-CM | POA: Diagnosis not present

## 2015-09-16 ENCOUNTER — Ambulatory Visit (INDEPENDENT_AMBULATORY_CARE_PROVIDER_SITE_OTHER): Payer: Medicare Other | Admitting: Podiatry

## 2015-09-16 ENCOUNTER — Encounter: Payer: Self-pay | Admitting: Podiatry

## 2015-09-16 DIAGNOSIS — Q828 Other specified congenital malformations of skin: Secondary | ICD-10-CM

## 2015-09-16 NOTE — Progress Notes (Signed)
He presents today with a chief complaint of a painful callus plantar aspect of the right foot. States that he can hardly put his foot to the floor.  Objective: Vital signs are stable alert and oriented 3. Pulses are strongly palpable. Neurologic sensorium is intact. Reactive hyperkeratosis plantar aspect of the fourth metatarsal head of the right foot does not demonstrates any signs of skin breakdown or ulceration. No signs of infection.  Assessment: Reactive hyperkeratosis plantar aspect of the right foot.  Plan: Debridement of reactive hyperkeratosis and will follow up with [place] as needed.

## 2015-09-17 ENCOUNTER — Other Ambulatory Visit: Payer: Self-pay | Admitting: Internal Medicine

## 2015-09-26 ENCOUNTER — Other Ambulatory Visit: Payer: Medicare Other

## 2015-09-30 ENCOUNTER — Ambulatory Visit: Payer: Medicare Other | Admitting: Internal Medicine

## 2015-10-29 NOTE — Progress Notes (Signed)
This encounter was created in error - please disregard.

## 2015-11-06 ENCOUNTER — Other Ambulatory Visit: Payer: Self-pay

## 2015-11-06 DIAGNOSIS — M12811 Other specific arthropathies, not elsewhere classified, right shoulder: Secondary | ICD-10-CM

## 2015-11-06 DIAGNOSIS — M75101 Unspecified rotator cuff tear or rupture of right shoulder, not specified as traumatic: Principal | ICD-10-CM

## 2015-11-06 MED ORDER — OXYCODONE HCL 30 MG PO TABS: ORAL_TABLET | ORAL | Status: DC

## 2015-11-06 NOTE — Telephone Encounter (Signed)
Patient was notified that prescription is ready to be picked up.

## 2015-11-10 ENCOUNTER — Ambulatory Visit (INDEPENDENT_AMBULATORY_CARE_PROVIDER_SITE_OTHER): Payer: Medicare Other | Admitting: Nurse Practitioner

## 2015-11-10 ENCOUNTER — Telehealth: Payer: Self-pay | Admitting: *Deleted

## 2015-11-10 ENCOUNTER — Encounter: Payer: Self-pay | Admitting: Nurse Practitioner

## 2015-11-10 VITALS — BP 130/90 | HR 71 | Temp 98.2°F | Resp 18 | Ht 75.0 in | Wt 255.8 lb

## 2015-11-10 DIAGNOSIS — B370 Candidal stomatitis: Secondary | ICD-10-CM | POA: Diagnosis not present

## 2015-11-10 MED ORDER — FIRST-DUKES MOUTHWASH MT SUSP: OROMUCOSAL | Status: DC

## 2015-11-10 MED ORDER — NYSTATIN 100000 UNIT/ML MT SUSP: OROMUCOSAL | Status: DC

## 2015-11-10 NOTE — Addendum Note (Signed)
Addended by: Rafael Bihari A on: 11/10/2015 04:48 PM   Modules accepted: Orders, Medications

## 2015-11-10 NOTE — Telephone Encounter (Signed)
Dc previous order and start nystatin 100,000 units/ML 5 ml 4 times daily swish and spit

## 2015-11-10 NOTE — Telephone Encounter (Signed)
Rx faxed to pharmacy and patient notified.

## 2015-11-10 NOTE — Progress Notes (Signed)
Patient ID: [name] [name]., male   DOB: 06/03/1940, 75 y.o.   MRN: [id number]    PCP: Estill Dooms, MD  Advanced Directive information Does patient have an advance directive?: Yes, Type of Advance Directive: [place];Living will;Out of facility DNR (pink MOST or yellow form)  Allergies  Allergen Reactions  . Amitriptyline   . Azithromycin     Stomach pain  . Flexeril [Cyclobenzaprine]     Causes dizziness    Chief Complaint  Patient presents with  . Acute Visit    sore in mouth x 1 week. Using OTC mouthwash with peroxide but does not help.  . OTHER    Wife also has sore in mouth x 1week.      HPI: Patient is a 75 y.o. male seen in the office today due to painful sores in mouth for 1 month. Pt reports wife has similar sores, they have been kissing a lot.  Sores noted on gumline under dentures. Has been drinking a lot of orange juice and eating tomatoes. Eating is painful. Pt uses OTC mouthwash which is not helping.  Never had this before. Wears dentures but does not change the cup out that he cleans them in often. No fevers or chills. No recent antibiotic use.  Review of Systems:  Review of Systems  Constitutional: Positive for appetite change. Negative for fever, activity change and fatigue.  HENT: Positive for dental problem and mouth sores. Negative for congestion, ear pain, postnasal drip, rhinorrhea and sinus pressure.   Respiratory: Negative for cough and shortness of breath.   Cardiovascular: Negative for chest pain.  Gastrointestinal: Negative for nausea, diarrhea and constipation.    Past Medical History  Diagnosis Date  . HTN (hypertension)   . Partial deafness   . Clostridium difficile colitis   . GERD (gastroesophageal reflux disease)   . Allergy   . Hx of echocardiogram     a. Echo 11/12:  Mod LVH, EF 55-60%, MAC, mod LAE, mild RAE  . Depression   . Atrial flutter ([place])     a. dx after inguinal hernia repair in 10/12 => seen by Dr.  Acie Fredrickson  . Arthritis     "right shoulder" (05/03/2012)  . Kidney stone     "they just passed" (05/03/2012)  . Allergic rhinitis due to pollen   . Pain in joint, pelvic region and thigh   . Complications affecting other specified body systems, hypertension   . Other specified cardiac dysrhythmias(427.89)   . Rash and other nonspecific skin eruption   . Sacroiliitis, not elsewhere classified ([place])   . Obesity, unspecified   . Inguinal hernia without mention of obstruction or gangrene, unilateral or unspecified, (not specified as recurrent)   . Intestinal infection due to Clostridium difficile   . Irritable bowel syndrome   . Special screening for malignant neoplasm of prostate   . Unspecified hereditary and idiopathic peripheral neuropathy   . Sacroiliitis, not elsewhere classified ([place])   . Spasm of muscle   . Cervicalgia   . Degeneration of cervical intervertebral disc   . Pain in joint, ankle and foot   . Cervicalgia   . Pain in joint, shoulder region   . Nonspecific abnormal electrocardiogram (ECG) (EKG)   . Calculus of kidney   . Depressive disorder, not elsewhere classified   . Unspecified constipation   . Pain in joint, lower leg   . Anxiety state, unspecified   . Other malaise and fatigue   .  Insomnia, unspecified   . Lumbago   . Coronary atherosclerosis of unspecified type of vessel, native or graft   . Nonspecific abnormal results of pulmonary system function study   . Impotence of organic origin   . Osteoarthrosis, unspecified whether generalized or localized, unspecified site    Past Surgical History  Procedure Laterality Date  . Appendectomy  ~ 1956  . Tonsillectomy  ~ 1946  . Renal cyst excision      pt denies this hx on 05/03/2012  . Colonoscopy  06/06/2008    severe sigmoid diverticulosis and internal hemorrhoids  . Esophagogastroduodenoscopy  06/06/2008    normal  . Posterior lumbar fusion  1989  . Total hip arthroplasty  1999-2000    bilateral  . Carpal  tunnel with cubital tunnel  2004    "right" (05/03/2012)  . Cardiac catheterization  08/1999; 05/03/2012    normal coronary arteries;   . Inguinal hernia repair  03/23/11    "left" (05/03/2012)  . Spine surgery  1969    Pantopaque Myelography and spinal infusion- Dr. Lyman Speller  . Reconstruction (r) foot surgery      DR SUE   . Right knee  [place]  . Atrial flutter ablation N/A 05/03/2012    Procedure: ATRIAL FLUTTER ABLATION;  Surgeon: Evans Lance, MD;  Location[facility] C[facility] LAB;  Service: Cardiovascular;  Laterality: N/A;   Social History:   reports that he has quit smoking. His smoking use included Cigarettes. He has a 10 pack-year smoking history. He has never used smokeless tobacco. He reports that he drinks alcohol. He reports that he does not use illicit drugs.  Family History  Problem Relation Age of Onset  . Colon cancer Neg Hx   . Hypertension Father     Medications: Patient's Medications  New Prescriptions   No medications on file  Previous Medications   ASPIRIN EC 81 MG TABLET    Take 1 tablet (81 mg total) by mouth daily.   HYDROCHLOROTHIAZIDE (HYDRODIURIL) 25 MG TABLET    TAKE 1/2 TABLET BY MOUTH TWICE DAILY FOR BLOOD PRESSURE   HYDROCODONE-IBUPROFEN (VICOPROFEN) 7.5-200 MG TABLET    Take one tablet by mouth up to four times daily as needed for pain.   LOSARTAN-HYDROCHLOROTHIAZIDE (HYZAAR) 100-25 MG PER TABLET    TAKE ONE TABLET BY MOUTH ONCE DAILY TO CONTROL BLOOD PRESSURE   ONDANSETRON (ZOFRAN) 4 MG TABLET    TAKE 1 TABLET BY MOUTH EVERY 8 HOURS AS NEEDED FOR NAUSEA   OXYCODONE (ROXICODONE) 30 MG IMMEDIATE RELEASE TABLET    One every 3 hours if needed to control pain  Modified Medications   No medications on file  Discontinued Medications   CETIRIZINE-PSEUDOEPHEDRINE (ZYRTEC-D) 5-120 MG TABLET    One twice daily to help congestion   FINASTERIDE (PROSCAR) 5 MG TABLET    One daily to help prostate   MUPIROCIN OINTMENT (BACTROBAN) 2 %    Apply to wound daily after soap and  water cleansing of the wound   TEMAZEPAM (RESTORIL) 15 MG CAPSULE    Take 1 capsule (15 mg total) by mouth at bedtime as needed for sleep.   VITAMIN C (ASCORBIC ACID) 500 MG TABLET    Take 2,000 mg by mouth daily.      Physical Exam:  Filed Vitals:   11/10/15 1314  BP: 130/90  Pulse: 71  Temp: 98.2 F (36.8 C)  TempSrc: Oral  Resp: 18  Height: 6\' 3"  (1.905 m)  Weight: 255 lb 12.8  oz (116.03 kg)  SpO2: 97%   Body mass index is 31.97 kg/(m^2).  Physical Exam  Constitutional: He appears well-developed and well-nourished.  HENT:  Head: Normocephalic and atraumatic.  Right Ear: External ear normal.  Left Ear: External ear normal.  Nose: Nose normal.  Red white patches noted to inside of cheeks top and bottom of mouth bilaterally. Red open sores noted.   Eyes: Conjunctivae are normal. Pupils are equal, round, and reactive to light.  Neck: Neck supple.  Lymphadenopathy:    He has no cervical adenopathy.    Labs reviewed: Basic Metabolic Panel:  Recent Labs  02/24/15 1618  NA 141  K 4.1  CL 99  CO2 24  GLUCOSE 82  BUN 22  CREATININE 1.41*  CALCIUM 9.6   Liver Function Tests: No results for input(s): AST, ALT, ALKPHOS, BILITOT, PROT, ALBUMIN in the last 8760 hours. No results for input(s): LIPASE, AMYLASE in the last 8760 hours. No results for input(s): AMMONIA in the last 8760 hours. CBC:  Recent Labs  02/24/15 1618  WBC 6.8  NEUTROABS 4.4  HCT 41.0  MCV 90  PLT 229   Lipid Panel: No results for input(s): CHOL, HDL, LDLCALC, TRIG, CHOLHDL, LDLDIRECT in the last 8760 hours. TSH: No results for input(s): TSH in the last 8760 hours. A1C: Lab Results  Component Value Date   HGBA1C * 08/10/2010    5.8 (NOTE)                                                                       According to the ADA Clinical Practice Recommendations for 2011, when HbA1c is used as a screening test:   >=6.5%   Diagnostic of Diabetes Mellitus           (if abnormal result   is confirmed)  5.7-6.4%   Increased risk of developing Diabetes Mellitus  References:Diagnosis and Classification of Diabetes Mellitus,Diabetes [id number] 1):S62-S69 and Standards of Medical Care in         Diabetes - 2011,Diabetes [id number]  (Suppl 1):S11-S61.     Assessment/Plan 1. Oral yeast infection -to take dentures out for 24 hours and clean dentures thoroughly.  - Diphenhyd-Hydrocort-Nystatin (FIRST-DUKES MOUTHWASH) SUSP; Swish and spit 5 cc 4 times daily until resolved  Dispense: 237 mL; Refill: 1  To follow up if symptoms persist or worsen   Khaleed Holan K. Harle Battiest  [facility] [phone] 8 am - 5 pm) [phone] (after hours)

## 2015-11-10 NOTE — Telephone Encounter (Signed)
Patient called and stated that pharmacy does not have the Mouthwash that was prescribed and stating they need the ingredients to make it. Please Advise.

## 2015-11-10 NOTE — Patient Instructions (Signed)
Make sure denture cup and dentures are cleaned well.   To use Dukes mouthwash 4 times daily until resolved   To not use dentures until resolved- soft food, avoid foods with high acid  Thrush, Adult Thrush, also called oral candidiasis, is a fungal infection that develops in the mouth and throat and on the tongue. It causes white patches to form on the mouth and tongue. Ritta Slot is most common in older adults, but it can occur at any age.  Many cases of thrush are mild, but this infection can also be more serious. Ritta Slot can be a recurring problem for people who have chronic illnesses or who take medicines that limit the body's ability to fight infection. Because these people have difficulty fighting infections, the fungus that causes thrush can spread throughout the body. This can cause life-threatening blood or organ infections. CAUSES  Ritta Slot is usually caused by a yeast called Candida albicans. This fungus is normally present in small amounts in the mouth and on other mucous membranes. It usually causes no harm. However, when conditions are present that allow the fungus to grow uncontrolled, it invades surrounding tissues and becomes an infection. Less often, other Candida species can also lead to thrush.  RISK FACTORS Ritta Slot is more likely to develop in the following people:  People with an impaired ability to fight infection (weakened immune system).   Older adults.   People with HIV.   People with diabetes.   People with dry mouth (xerostomia).   Pregnant women.   People with poor dental care, especially those who have false teeth.   People who use antibiotic medicines.  SIGNS AND SYMPTOMS  Ritta Slot can be a mild infection that causes no symptoms. If symptoms develop, they may include:   A burning feeling in the mouth and throat. This can occur at the start of a thrush infection.   White patches that adhere to the mouth and tongue. The tissue around the patches may be  red, raw, and painful. If rubbed (during tooth brushing, for example), the patches and the tissue of the mouth may bleed easily.   A bad taste in the mouth or difficulty tasting foods.   Cottony feeling in the mouth.   Pain during eating and swallowing. DIAGNOSIS  Your health care provider can usually diagnose thrush by looking in your mouth and asking you questions about your health.  TREATMENT  Medicines that help prevent the growth of fungi (antifungals) are the standard treatment for thrush. These medicines are either applied directly to the affected area (topical) or swallowed (oral). The treatment will depend on the severity of the condition.  Mild Thrush Mild cases of thrush may clear up with the use of an antifungal mouth rinse or lozenges. Treatment usually lasts about 14 days.  Moderate to Severe Thrush  More severe thrush infections that have spread to the esophagus are treated with an oral antifungal medicine. A topical antifungal medicine may also be used.   For some severe infections, a treatment period longer than 14 days may be needed.   Oral antifungal medicines are almost never used during pregnancy because the fetus may be harmed. However, if a pregnant woman has a rare, severe thrush infection that has spread to her blood, oral antifungal medicines may be used. In this case, the risk of harm to the mother and fetus from the severe thrush infection may be greater than the risk posed by the use of antifungal medicines.  Persistent or Recurrent Thrush  For cases of thrush that do not go away or keep coming back, treatment may involve the following:   Treatment may be needed twice as long as the symptoms last.   Treatment will include both oral and topical antifungal medicines.   People with weakened immune systems can take an antifungal medicine on a continuous basis to prevent thrush infections.  It is important to treat conditions that make you more likely to  get thrush, such as diabetes or HIV.  HOME CARE INSTRUCTIONS   Only take over-the-counter or prescription medicine as directed by your health care provider. Talk to your health care provider about an over-the-counter medicine called gentian violet, which kills bacteria and fungi.   Eat plain, unflavored yogurt as directed by your health care provider. Check the label to make sure the yogurt contains live cultures. This yogurt can help healthy bacteria grow in the mouth that can stop the growth of the fungus that causes thrush.   Try these measures to help reduce the discomfort of thrush:   Drink cold liquids such as water or iced tea.   Try flavored ice treats or frozen juices.   Eat foods that are easy to swallow, such as gelatin, ice cream, or custard.   If the patches in your mouth are painful, try drinking from a straw.   Rinse your mouth several times a day with a warm saltwater rinse. You can make the saltwater mixture with 1 tsp (6 g) of salt in 8 fl oz (0.2 L) of warm water.   If you wear dentures, remove the dentures before going to bed, brush them vigorously, and soak them in a cleaning solution as directed by your health care provider.   Women who are breastfeeding should clean their nipples with an antifungal medicine as directed by their health care provider. Dry the nipples after breastfeeding. Applying lanolin-containing body lotion may help relieve nipple soreness.  SEEK MEDICAL CARE IF:  Your symptoms are getting worse or are not improving within 7 days of starting treatment.   You have symptoms of spreading infection, such as white patches on the skin outside of the mouth.   You are nursing and you have redness, burning, or pain in the nipples that is not relieved with treatment.  MAKE SURE YOU:  Understand these instructions.  Will watch your condition.  Will get help right away if you are not doing well or get worse.   This information is not  intended to replace advice given to you by your health care provider. Make sure you discuss any questions you have with your health care provider.   Document Released: 02/10/2004 Document Revised: 06/07/2014 Document Reviewed: 12/18/2012 Elsevier Interactive Patient Education [employer].

## 2015-11-12 ENCOUNTER — Other Ambulatory Visit: Payer: Self-pay | Admitting: *Deleted

## 2015-11-12 MED ORDER — NYSTATIN 100000 UNIT/ML MT SUSP: OROMUCOSAL | Status: DC

## 2015-11-12 NOTE — Telephone Encounter (Signed)
Patient ran out of mouthwash after 2 days and needed a refill due to not resolved yet.

## 2015-11-13 ENCOUNTER — Other Ambulatory Visit: Payer: Self-pay | Admitting: Internal Medicine

## 2015-11-21 ENCOUNTER — Other Ambulatory Visit: Payer: Medicare Other

## 2015-11-21 DIAGNOSIS — I10 Essential (primary) hypertension: Secondary | ICD-10-CM

## 2015-11-21 DIAGNOSIS — N182 Chronic kidney disease, stage 2 (mild): Secondary | ICD-10-CM

## 2015-11-22 LAB — BASIC METABOLIC PANEL
BUN / CREAT RATIO: 19 (ref 10–24)
BUN: 21 mg/dL (ref 8–27)
CHLORIDE: 100 mmol/L (ref 96–106)
CO2: 24 mmol/L (ref 18–29)
Calcium: 9.5 mg/dL (ref 8.6–10.2)
Creatinine, Ser: 1.12 mg/dL (ref 0.76–1.27)
GFR calc non Af Amer: 64 mL/min/{1.73_m2} (ref >59)
GFR, EST AFRICAN AMERICAN: 74 mL/min/{1.73_m2} (ref >59)
GLUCOSE: 90 mg/dL (ref 65–99)
Potassium: 4.4 mmol/L (ref 3.5–5.2)
SODIUM: 140 mmol/L (ref 134–144)

## 2015-11-25 ENCOUNTER — Ambulatory Visit (INDEPENDENT_AMBULATORY_CARE_PROVIDER_SITE_OTHER): Payer: Medicare Other | Admitting: Internal Medicine

## 2015-11-25 ENCOUNTER — Encounter: Payer: Self-pay | Admitting: Internal Medicine

## 2015-11-25 VITALS — BP 140/78 | HR 69 | Temp 98.1°F | Ht 75.0 in | Wt 260.0 lb

## 2015-11-25 DIAGNOSIS — N182 Chronic kidney disease, stage 2 (mild): Secondary | ICD-10-CM

## 2015-11-25 DIAGNOSIS — I10 Essential (primary) hypertension: Secondary | ICD-10-CM | POA: Diagnosis not present

## 2015-11-25 DIAGNOSIS — M791 Myalgia: Secondary | ICD-10-CM | POA: Diagnosis not present

## 2015-11-25 DIAGNOSIS — M609 Myositis, unspecified: Secondary | ICD-10-CM | POA: Diagnosis not present

## 2015-11-25 DIAGNOSIS — M25561 Pain in right knee: Secondary | ICD-10-CM | POA: Diagnosis not present

## 2015-11-25 DIAGNOSIS — M25562 Pain in left knee: Secondary | ICD-10-CM | POA: Insufficient documentation

## 2015-11-25 DIAGNOSIS — R531 Weakness: Secondary | ICD-10-CM | POA: Diagnosis not present

## 2015-11-25 DIAGNOSIS — IMO0001 Reserved for inherently not codable concepts without codable children: Secondary | ICD-10-CM | POA: Insufficient documentation

## 2015-11-25 NOTE — Progress Notes (Signed)
Patient ID: [name] Camp., male   DOB: 1941/05/04, 75 y.o.   MRN: [id number]    Facility  [facility]    Place of Service:   OFFICE    Allergies  Allergen Reactions  . Amitriptyline   . Azithromycin     Stomach pain  . Flexeril [Cyclobenzaprine]     Causes dizziness    Chief Complaint  Patient presents with  . Medical Management of Chronic Issues    3 month medication management blood pressure, depression  . Allergies    drainage, cough only at night,when he lays down.  . Extremity Weakness    in legs and knees    HPI:  Seen 11/10/15 by Joelene Millin, NP for painful sores in the mouth for a month. Treated with Duke's Magic Mouthwash. Cleared up in 3 days.  Complains of no strength in the legs. Feels wobbly. Says he only has about 1/2 the stamina he had  6 months ago. Climbing stairs is not possible without a bannister. Hips ache. Saw Dr. Gladstone Lighter a few months ago. Was given a medication that helped. He does not remember what the name was. Denies loss of upper extremity strength. Feels off balance. He stopped working out about 6 months ago when his symptoms started.  Painful in both knees.  Medications: Patient's Medications  New Prescriptions   No medications on file  Previous Medications   ASPIRIN EC 81 MG TABLET    Take 1 tablet (81 mg total) by mouth daily.   HYDROCHLOROTHIAZIDE (HYDRODIURIL) 25 MG TABLET    TAKE 1/2 TABLET BY MOUTH TWICE DAILY FOR BLOOD PRESSURE   HYDROCODONE-IBUPROFEN (VICOPROFEN) 7.5-200 MG TABLET    Take one tablet by mouth up to four times daily as needed for pain.   LOSARTAN-HYDROCHLOROTHIAZIDE (HYZAAR) 100-25 MG PER TABLET    TAKE ONE TABLET BY MOUTH ONCE DAILY TO CONTROL BLOOD PRESSURE   NYSTATIN (MYCOSTATIN) 100000 UNIT/ML SUSPENSION    68m by mouth four times daily, swish and spit   ONDANSETRON (ZOFRAN) 4 MG TABLET    TAKE 1 TABLET BY MOUTH EVERY 8 HOURS AS NEEDED FOR NAUSEA   OXYCODONE (ROXICODONE) 30 MG IMMEDIATE RELEASE TABLET    One every 3 hours  if needed to control pain  Modified Medications   No medications on file  Discontinued Medications   No medications on file    Review of Systems  Constitutional: Positive for appetite change and fatigue. Negative for fever and chills.  HENT: Positive for hearing loss. Negative for ear pain, sinus pressure, trouble swallowing and voice change.   Eyes: Negative.   Cardiovascular:       Hx hypertension.   Gastrointestinal: Negative for diarrhea, constipation, blood in stool and abdominal distention.  Endocrine: Negative.   Genitourinary: Negative.   Musculoskeletal:       Increased shoulder pains. Painful to raise or rotate shoulders.  Skin:       Painless atrophic plaque of the left forearm. Skin tear/laceration of the left forearm mid March 2017. Initially had some periodic discharge, but it seemed to be improving since 09/02/2015.  Neurological: Negative.   Hematological: Negative.   Psychiatric/Behavioral: Positive for sleep disturbance (Insomnia) and dysphoric mood. Negative for suicidal ideas and self-injury. The patient is nervous/anxious.     Filed Vitals:   11/25/15 1506  BP: 140/78  Pulse: 69  Temp: 98.1 F (36.7 C)  TempSrc: Oral  Height: '6\' 3"'  (1.905 m)  Weight: 260 lb (117.935 kg)  SpO2: 97%  Body mass index is 32.5 kg/(m^2). Filed Weights   11/25/15 1506  Weight: 260 lb (117.935 kg)     Physical Exam  Constitutional: He is oriented to person, place, and time. He appears well-developed and well-nourished. No distress.  Moderately overweight  HENT:  Head: Normocephalic and atraumatic.  Significant partial deafness bilaterally.  Eyes: Conjunctivae and EOM are normal. Pupils are equal, round, and reactive to light.  Neck: Normal range of motion. Neck supple. No JVD present. No tracheal deviation present. No thyromegaly present.  Cardiovascular: Normal rate, regular rhythm, normal heart sounds and intact distal pulses.  Exam reveals no gallop and no friction  rub.   No murmur heard. Pulmonary/Chest: Effort normal and breath sounds normal. No respiratory distress. He has no wheezes. He has no rales. He exhibits no tenderness.  Abdominal: Soft. Bowel sounds are normal. He exhibits no distension and no mass. There is no tenderness.  Genitourinary: Rectum normal, prostate normal and penis normal. Guaiac negative stool.  Musculoskeletal: He exhibits no edema or tenderness.  Hammer toe right 3rd. Painful shoulders with elevation or rotation. Pain in both knees. Pushes off to get out of a chair. Unable to fully squat and come back up.  Lymphadenopathy:    He has no cervical adenopathy.  Neurological: He is alert and oriented to person, place, and time. No cranial nerve deficit.  Skin: Skin is dry. No rash noted. No erythema. No pallor.  Atrophic plaque of the left forearm. Skin tear/laceration of the left forearm. Surrounding bruising. No purulence. Irritation of the skin surrounding this wound secondary to either bandages or Neosporin reaction.  Psychiatric: His speech is normal and behavior is normal. Judgment and thought content normal. Cognition and memory are normal. He exhibits a depressed mood.  Anxiety and depressed mood.    Labs reviewed: Lab Summary Latest Ref Rng 11/21/2015 02/24/2015  Hemoglobin 12.6 - 17.7 g/dL (None) 14.0  Hematocrit 37.5 - 51.0 % (None) 41.0  White count 3.4 - 10.8 x10E3/uL (None) 6.8  Platelet count 150 - 379 x10E3/uL (None) 229  Sodium 134 - 144 mmol/L 140 141  Potassium 3.5 - 5.2 mmol/L 4.4 4.1  Calcium 8.6 - 10.2 mg/dL 9.5 9.6  Phosphorus - (None) (None)  Creatinine 0.76 - 1.27 mg/dL 1.12 1.41(H)  AST - (None) (None)  Alk Phos - (None) (None)  Bilirubin - (None) (None)  Glucose 65 - 99 mg/dL 90 82  Cholesterol - (None) (None)  HDL cholesterol - (None) (None)  Triglycerides - (None) (None)  LDL Direct - (None) (None)  LDL Calc - (None) (None)  Total protein - (None) (None)  Albumin - (None) (None)   Lab  Results  Component Value Date   TSH 1.57 03/22/2012   TSH 1.219 03/23/2011   TSH 0.792 08/10/2010   Lab Results  Component Value Date   BUN 21 11/21/2015   BUN 22 02/24/2015   BUN 18 10/18/2013   Lab Results  Component Value Date   HGBA1C * 08/10/2010    5.8 (NOTE)                                                                       According to the ADA Clinical Practice Recommendations for 2011, when HbA1c is  used as a screening test:   >=6.5%   Diagnostic of Diabetes Mellitus           (if abnormal result  is confirmed)  5.7-6.4%   Increased risk of developing Diabetes Mellitus  References:Diagnosis and Classification of Diabetes Mellitus,Diabetes DYJW,9295,74(BBUYZ 1):S62-S69 and Standards of Medical Care in         Diabetes - 2011,Diabetes [id number],4383,81  (Suppl 1):S11-S61.    Assessment/Plan  1. Pain in both knees Find out what Dr. Gladstone Lighter prescribed  2. Myalgia and myositis - Sedimentation rate - CK  3. Weak - TSH - Ambulatory referral to Physical Therapy  4. Essential hypertension controlled  5. CKD (chronic kidney disease), stage 2 (mild) Normal on the lab done rently

## 2015-11-26 LAB — CK: Total CK: 100 U/L (ref 24–204)

## 2015-11-26 LAB — TSH: TSH: 1.52 u[IU]/mL (ref 0.450–4.500)

## 2015-11-26 LAB — SEDIMENTATION RATE: Sed Rate: 5 mm/hr (ref 0–30)

## 2015-11-28 ENCOUNTER — Telehealth: Payer: Self-pay | Admitting: *Deleted

## 2015-11-28 NOTE — Telephone Encounter (Signed)
Patient called and wanted to know if you have found out the name of the medication prescribed by Dr. Emiliano Dyer for his knee pain. He stated he would like a refill called to pharmacy. Please Advise.

## 2015-12-03 NOTE — Telephone Encounter (Signed)
Will forwarded to in-office Medical Assistant to address

## 2015-12-03 NOTE — Telephone Encounter (Signed)
To the best of my knowledge, we have not received a response from Dr. Charlestine Night office, despite sending the release of information form to them regarding his last visit. Perhaps he can call Dr. Charlestine Night office and find out what the name of this drug is.

## 2015-12-03 NOTE — Telephone Encounter (Signed)
Spoke with patient regarding this message, I informed him that we have not received any information from the office of Dr. Gladstone Lighter as of this morning. I asked if he could call the office to see if he could get the office to send the medication name, so that Dr. Nyoka Cowden could take care of it today since he is in the office until 5:00 pm.

## 2015-12-05 ENCOUNTER — Other Ambulatory Visit: Payer: Self-pay

## 2015-12-05 DIAGNOSIS — M75101 Unspecified rotator cuff tear or rupture of right shoulder, not specified as traumatic: Principal | ICD-10-CM

## 2015-12-05 DIAGNOSIS — M12811 Other specific arthropathies, not elsewhere classified, right shoulder: Secondary | ICD-10-CM

## 2015-12-05 MED ORDER — OXYCODONE HCL 30 MG PO TABS: ORAL_TABLET | ORAL | Status: DC

## 2015-12-09 NOTE — Telephone Encounter (Signed)
Patient called Dr. Charlestine Night office and stated that they told him the medication prescribed was Prednisone 5mg . Patient wants to know if this would be ok for him to take and if so if we could call it to his pharmacy. Please Advise.

## 2015-12-09 NOTE — Telephone Encounter (Signed)
Medication approved: Prednisone 5 mg (dispense 60 tablets) 2 tablets each morning to help arthritis.

## 2015-12-10 MED ORDER — PREDNISONE 5 MG PO TABS: ORAL_TABLET | ORAL | Status: DC

## 2015-12-10 NOTE — Telephone Encounter (Signed)
Rx faxed to pharmacy and patient notified.

## 2015-12-23 ENCOUNTER — Other Ambulatory Visit: Payer: Self-pay | Admitting: Internal Medicine

## 2015-12-24 ENCOUNTER — Encounter: Payer: Self-pay | Admitting: Physical Therapy

## 2015-12-24 ENCOUNTER — Ambulatory Visit: Payer: Medicare Other | Attending: Internal Medicine | Admitting: Physical Therapy

## 2015-12-24 DIAGNOSIS — M6281 Muscle weakness (generalized): Secondary | ICD-10-CM | POA: Diagnosis not present

## 2015-12-24 DIAGNOSIS — R262 Difficulty in walking, not elsewhere classified: Secondary | ICD-10-CM | POA: Diagnosis not present

## 2015-12-24 NOTE — Patient Instructions (Signed)
Educated patient in possible return to a gym in the future but cautioned him on his mentality of not wanting to use any machines and wanting to do heavy weights.

## 2015-12-24 NOTE — Therapy (Addendum)
[address] [place], [place], [address] Phone: [phone]   Fax:  [phone]  Physical Therapy Evaluation  Patient Details  Name: [name] [name]. MRN: [id number] Date of Birth: 11-01-1940 Referring Provider: Jeanmarie Hubert  Encounter Date: 12/24/2015      PT End of Session - 12/24/15 1521    Visit Number 1   Date for PT Re-Evaluation 02/24/16   PT Start Time 8416   PT Stop Time 1530   PT Time Calculation (min) 56 min   Activity Tolerance Patient tolerated treatment well   Behavior During Therapy [facility] for tasks assessed/performed;Impulsive      Past Medical History:  Diagnosis Date  . Allergic rhinitis due to pollen   . Allergy   . Anxiety state, unspecified   . Arthritis    "right shoulder" (05/03/2012)  . Atrial flutter ([place])    a. dx after inguinal hernia repair in 10/12 => seen by Dr. Acie Fredrickson  . Calculus of kidney   . Cervicalgia   . Cervicalgia   . Clostridium difficile colitis   . Complications affecting other specified body systems, hypertension   . Coronary atherosclerosis of unspecified type of vessel, native or graft   . Degeneration of cervical intervertebral disc   . Depression   . Depressive disorder, not elsewhere classified   . GERD (gastroesophageal reflux disease)   . HTN (hypertension)   . Hx of echocardiogram    a. Echo 11/12:  Mod LVH, EF 55-60%, MAC, mod LAE, mild RAE  . Impotence of organic origin   . Inguinal hernia without mention of obstruction or gangrene, unilateral or unspecified, (not specified as recurrent)   . Insomnia, unspecified   . Intestinal infection due to Clostridium difficile   . Irritable bowel syndrome   . Kidney stone    "they just passed" (05/03/2012)  . Lumbago   . Nonspecific abnormal electrocardiogram (ECG) (EKG)   . Nonspecific abnormal results of pulmonary system function study   . Obesity, unspecified   . Osteoarthrosis, unspecified  whether generalized or localized, unspecified site   . Other malaise and fatigue   . Other specified cardiac dysrhythmias(427.89)   . Pain in joint, ankle and foot   . Pain in joint, lower leg   . Pain in joint, pelvic region and thigh   . Pain in joint, shoulder region   . Partial deafness   . Rash and other nonspecific skin eruption   . Sacroiliitis, not elsewhere classified ([place])   . Sacroiliitis, not elsewhere classified ([place])   . Spasm of muscle   . Special screening for malignant neoplasm of prostate   . Unspecified constipation   . Unspecified hereditary and idiopathic peripheral neuropathy     Past Surgical History:  Procedure Laterality Date  . APPENDECTOMY  ~ 1956  . ATRIAL FLUTTER ABLATION N/A 05/03/2012   Procedure: ATRIAL FLUTTER ABLATION;  Surgeon: Evans Lance, MD;  Location: [facility] CATH LAB;  Service: Cardiovascular;  Laterality: N/A;  . CARDIAC CATHETERIZATION  08/1999; 05/03/2012   normal coronary arteries;   . CARPAL TUNNEL WITH CUBITAL TUNNEL  2004   "right" (05/03/2012)  . COLONOSCOPY  06/06/2008   severe sigmoid diverticulosis and internal hemorrhoids  . ESOPHAGOGASTRODUODENOSCOPY  06/06/2008   normal  . INGUINAL HERNIA REPAIR  03/23/11   "left" (05/03/2012)  . POSTERIOR LUMBAR FUSION  1989  . reconstruction (r) foot surgery     DR SUE   . RENAL  CYST EXCISION     pt denies this hx on 05/03/2012  . [place]  . SPINE SURGERY  1969   Pantopaque Myelography and spinal infusion- Dr. Lyman Speller  . TONSILLECTOMY  ~ 1946  . TOTAL HIP ARTHROPLASTY  1999-2000   bilateral    There were no vitals filed for this visit.       Subjective Assessment - 12/24/15 1440    Subjective Patient reports declining health over the past 2 years, he reports frequent falls and difficulty getting up from sitting, He reports that he has lost a lot of mms.  He reports a torn right RC, he has bilateral hip replacments, he reportrs that he was injuring himself going to the gym on  multiple occasions.   Limitations Walking;House hold activities   Patient Stated Goals get up from sitting easier   Currently in Pain? Yes   Pain Score 1    Pain Location Back   Pain Orientation Left;Lower   Pain Descriptors / Indicators Aching;Sore   Pain Type Chronic pain   Pain Onset More than a month ago   Pain Frequency Intermittent   Aggravating Factors  worese with squatting in the knees, worse with walking in the left low back pain a 7/10   Pain Relieving Factors rest   Effect of Pain on Daily Activities difficulty walking            [facility] PT Assessment - 12/24/15 0001      Assessment   Medical Diagnosis Weakness, difficulty walking   Referring Provider Jeanmarie Hubert   Onset Date/Surgical Date 11/24/15   Hand Dominance Right   Prior Therapy no     Precautions   Precautions None     Balance Screen   Has the patient fallen in the past 6 months Yes   How many times? 2   Has the patient had a decrease in activity level because of a fear of falling?  No   Is the patient reluctant to leave their home because of a fear of falling?  No     Home Environment   Additional Comments minimal housework     Prior Function   Level of Independence Independent   Vocation Retired   Leisure about 1 year ago was exercising at the gym reports that he prefers free [employer]     Posture/Postural Control   Posture Comments fwd head, rounded shoulder     ROM / Strength   AROM / PROM / Strength AROM;Strength     AROM   Overall AROM Comments Hips and knees WNL's, lumbar ROM was decreased 50%     Strength   Overall Strength Comments arms 4/5, LE's 3+/5     Palpation   Palpation comment mild soreness in teh left buttock area, has mm wasting UE and LE     Ambulation/Gait   Gait Comments no device, slow, stooped posture     Standardized Balance Assessment   Standardized Balance Assessment Timed Up and Go Test;Berg Balance Test     Berg Balance Test   Sit to Stand Able to stand   independently using hands   Standing Unsupported Able to stand safely 2 minutes   Sitting with Back Unsupported but Feet Supported on Floor or Stool Able to sit safely and securely 2 minutes   Stand to Sit Sits safely with minimal use of hands   Transfers Able to transfer safely, definite need of hands   Standing Unsupported with  Eyes Closed Able to stand 10 seconds safely   Standing Ubsupported with Feet Together Able to place feet together independently and stand 1 minute safely   From Standing, Reach Forward with Outstretched Arm Can reach confidently >25 cm (10")   From Standing Position, Pick up Object from Floor Able to pick up shoe, needs supervision   From Standing Position, Turn to Look Behind Over each Shoulder Looks behind one side only/other side shows less weight shift   Turn 360 Degrees Able to turn 360 degrees safely one side only in 4 seconds or less   Standing Unsupported, Alternately Place Feet on Step/Stool Able to stand independently and complete 8 steps >20 seconds   Standing Unsupported, One Foot in [place] to plae foot ahead of the other independently and hold 30 seconds   Standing on One Leg Tries to lift leg/unable to hold 3 seconds but remains standing independently   Total Score 46     Timed Up and Go Test   Normal TUG (seconds) 15                   [facility] Adult PT Treatment/Exercise - 12/24/15 0001      Exercises   Exercises Knee/Hip     Knee/Hip Exercises: Aerobic   Nustep level 5 x 5 minutes     Knee/Hip Exercises: Machines for Strengthening   Cybex Knee Flexion 35# 2x15   Cybex Leg Press 50# 2x10, 20# single legs x10 each                     PT Long Term Goals - 12/24/15 1530      PT LONG TERM GOAL #1   Title understand proper posture and body mechanics to prevent injury   Time 8   Period Weeks   Status New     PT LONG TERM GOAL #2   Title independent and safe return to gym   Time 8   Period Weeks   Status New      PT LONG TERM GOAL #3   Title get up from sitting without hands   Time 8   Period Weeks   Status New     PT LONG TERM GOAL #4   Title increase hip flexion strength to 4/5   Time 8   Period Weeks   Status New               Plan - 12/24/15 1522    Clinical Impression Statement Patient reports increased difficulty over the past few years with walking and getting up from sitting, he does report multiple stumbles.  He reports that he has not been in a gym in over 9 months as he "always hurt myself", he describes only wanting to do free weights and heavy weight.  His TUG time was normal Berg score was 46, he does have very weak hip flexors and abductors   Rehab Potential Good   PT Frequency 1x / week   PT Duration 8 weeks   PT Treatment/Interventions ADLs/Self Care Home Management;Patient/family education;Therapeutic exercise;Therapeutic activities;Balance training;Manual techniques   PT Next Visit Plan add gym exercises and start working with him on what he could do at the gym, weights and form.   Consulted and Agree with Plan of Care Patient      Patient will benefit from skilled therapeutic intervention in order to improve the following deficits and impairments:  Abnormal gait, Cardiopulmonary status limiting activity, Decreased balance, Decreased mobility,  Decreased range of motion, Decreased strength, Difficulty walking, Postural dysfunction, Improper body mechanics, Pain  Visit Diagnosis: Muscle weakness (generalized) - Plan: PT plan of care cert/re-cert  Difficulty in walking, not elsewhere classified - Plan: PT plan of care cert/re-cert      G-Codes - 70/05/25 1607    Functional Assessment Tool Used foto 60% limitation   Functional Limitation Mobility: Walking and moving around   Mobility: Walking and Moving Around Current Status [phone]) At least 60 percent but less than 80 percent impaired, limited or restricted   Mobility: Walking and Moving Around Goal Status  [phone]) At least 40 percent but less than 60 percent impaired, limited or restricted       Problem List Patient Active Problem List   Diagnosis Date Noted  . Pain in both knees 11/25/2015  . Myalgia and myositis 11/25/2015  . Weak 11/25/2015  . Laceration of arm 09/03/2015  . Insomnia 09/03/2015  . Frequency of urination 05/27/2015  . Post herpetic Neuropathy 03/12/2015  . Depression, major, recurrent, moderate ([place]) 03/12/2015  . Stress at home 01/26/2015  . Pain in left foot 06/26/2014  . Corn of toe 01/09/2014  . Allergic rhinitis 10/01/2013  . Rotator cuff tear arthropathy of right shoulder 10/01/2013  . Pain in joint, shoulder region 01/02/2013  . HTN (hypertension)   . Partial deafness   . Obesity   . [place] 10/04/2012  . CKD (chronic kidney disease) 03/22/2012  . Chronic constipation 11/13/2010   PHYSICAL THERAPY DISCHARGE SUMMARY   Plan: Patient agrees to discharge.  Patient goals were not met. Patient is being discharged due to not returning since the last visit.  ?????      Sumner Boast., PT 12/24/2015, 4:16 PM  [address] [place], [place], [address] Phone: [phone]   Fax:  [phone]  Name: Khalon Cansler. MRN: [id number] Date of Birth: [birth year]

## 2015-12-31 ENCOUNTER — Ambulatory Visit: Payer: Self-pay | Admitting: Physical Therapy

## 2016-01-02 ENCOUNTER — Other Ambulatory Visit: Payer: Self-pay | Admitting: *Deleted

## 2016-01-02 ENCOUNTER — Encounter: Payer: Self-pay | Admitting: Internal Medicine

## 2016-01-02 ENCOUNTER — Ambulatory Visit (INDEPENDENT_AMBULATORY_CARE_PROVIDER_SITE_OTHER): Payer: Medicare Other | Admitting: Internal Medicine

## 2016-01-02 VITALS — BP 144/90 | HR 60 | Temp 98.0°F | Ht 75.0 in | Wt 261.0 lb

## 2016-01-02 DIAGNOSIS — L089 Local infection of the skin and subcutaneous tissue, unspecified: Secondary | ICD-10-CM | POA: Diagnosis not present

## 2016-01-02 DIAGNOSIS — M12811 Other specific arthropathies, not elsewhere classified, right shoulder: Secondary | ICD-10-CM

## 2016-01-02 DIAGNOSIS — M75101 Unspecified rotator cuff tear or rupture of right shoulder, not specified as traumatic: Principal | ICD-10-CM

## 2016-01-02 DIAGNOSIS — T148XXA Other injury of unspecified body region, initial encounter: Principal | ICD-10-CM

## 2016-01-02 MED ORDER — OXYCODONE HCL 30 MG PO TABS: ORAL_TABLET | ORAL | 0 refills | Status: DC

## 2016-01-02 MED ORDER — CEPHALEXIN 500 MG PO CAPS: 500.0000 mg | ORAL_CAPSULE | Freq: Two times a day (BID) | ORAL | 0 refills | Status: DC

## 2016-01-02 NOTE — Patient Instructions (Addendum)
Clean wound with warm soapy water using Dial antibacterial soap daily. Cover with dry nonstick dressing daily  Take antibiotic 2 times daily x 10 days  Take probiotic (yogurt or culturelle) daily while on antibiotic  Continue other medications as ordered  Return/call office if no better or worsens in 48-72 hrs  Follow up with Dr Nyoka Cowden as scheduled  Laceration/Skin tear Care, Adult A laceration is a cut that goes through all layers of the skin. The cut also goes into the tissue that is right under the skin. Some cuts heal on their own. Others need to be closed with stitches (sutures), staples, skin adhesive strips, or wound glue. Taking care of your cut lowers your risk of infection and helps your cut to heal better. HOW TO TAKE CARE OF YOUR CUT General Instructions  To help prevent scarring, make sure to cover your wound with sunscreen whenever you are outside after stitches are removed, after adhesive strips are removed, or when wound glue stays in place and the wound is healed. Make sure to wear a sunscreen of at least 30 SPF.  Take over-the-counter and prescription medicines only as told by your doctor.  If you were given antibiotic medicine or ointment, take or apply it as told by your doctor. Do not stop using the antibiotic even if your wound is getting better.  Do not scratch or pick at the wound.  Keep all follow-up visits as told by your doctor. This is important.  Check your wound every day for signs of infection. Watch for:  Redness, swelling, or pain.  Fluid, blood, or pus.  Raise (elevate) the injured area above the level of your heart while you are sitting or lying down, if possible. GET HELP IF:  You got a tetanus shot and you have any of these problems at the injection site:  Swelling.  Very bad pain.  Redness.  Bleeding.  You have a fever.  A wound that was closed breaks open.  You notice a bad smell coming from your wound or your bandage.  You  notice something coming out of the wound, such as wood or glass.  Medicine does not help your pain.  You have more redness, swelling, or pain at the site of your wound.  You have fluid, blood, or pus coming from your wound.  You notice a change in the color of your skin near your wound.  You need to change the bandage often because fluid, blood, or pus is coming from the wound.  You start to have a new rash.  You start to have numbness around the wound. GET HELP RIGHT AWAY IF:  You have very bad swelling around the wound.  Your pain suddenly gets worse and is very bad.  You notice painful lumps near the wound or on skin that is anywhere on your body.  You have a red streak going away from your wound.  The wound is on your hand or foot and you cannot move a finger or toe like you usually can.  The wound is on your hand or foot and you notice that your fingers or toes look pale or bluish.   This information is not intended to replace advice given to you by your health care provider. Make sure you discuss any questions you have with your health care provider.   Document Released: 11/03/2007 Document Revised: 10/01/2014 Document Reviewed: 05/13/2014 Elsevier Interactive Patient Education [employer].

## 2016-01-02 NOTE — Telephone Encounter (Signed)
Patient requested and will pick up 

## 2016-01-02 NOTE — Progress Notes (Signed)
Patient ID: Bubba Camp., male   DOB: 1940/12/11, 75 y.o.   MRN: [id number]    Location:  [facility] Place of Service: OFFICE  Chief Complaint  Patient presents with  . Acute Visit    Examine cut on left forearm- not healing. Patient injured on metal door 5days ago     HPI:  75 yo male of Dr Rolly Salter seen today for acute visit. He cut his left forearm on metal 5 days ago and it does not appear to be healing well. His UE was 'sliced" by metal object that fell on it. Last Tdap 06/2011. He tried topical abx and H2O2. He kept it covered and has noticed oozing.  No f/c, numbness or tingling, weakness.  Past Medical History:  Diagnosis Date  . Allergic rhinitis due to pollen   . Allergy   . Anxiety state, unspecified   . Arthritis    "right shoulder" (05/03/2012)  . Atrial flutter ([place])    a. dx after inguinal hernia repair in 10/12 => seen by Dr. Acie Fredrickson  . Calculus of kidney   . Cervicalgia   . Cervicalgia   . Clostridium difficile colitis   . Complications affecting other specified body systems, hypertension   . Coronary atherosclerosis of unspecified type of vessel, native or graft   . Degeneration of cervical intervertebral disc   . Depression   . Depressive disorder, not elsewhere classified   . GERD (gastroesophageal reflux disease)   . HTN (hypertension)   . Hx of echocardiogram    a. Echo 11/12:  Mod LVH, EF 55-60%, MAC, mod LAE, mild RAE  . Impotence of organic origin   . Inguinal hernia without mention of obstruction or gangrene, unilateral or unspecified, (not specified as recurrent)   . Insomnia, unspecified   . Intestinal infection due to Clostridium difficile   . Irritable bowel syndrome   . Kidney stone    "they just passed" (05/03/2012)  . Lumbago   . Nonspecific abnormal electrocardiogram (ECG) (EKG)   . Nonspecific abnormal results of pulmonary system function study   . Obesity, unspecified   . Osteoarthrosis, unspecified whether generalized or localized,  unspecified site   . Other malaise and fatigue   . Other specified cardiac dysrhythmias(427.89)   . Pain in joint, ankle and foot   . Pain in joint, lower leg   . Pain in joint, pelvic region and thigh   . Pain in joint, shoulder region   . Partial deafness   . Rash and other nonspecific skin eruption   . Sacroiliitis, not elsewhere classified ([place])   . Sacroiliitis, not elsewhere classified ([place])   . Spasm of muscle   . Special screening for malignant neoplasm of prostate   . Unspecified constipation   . Unspecified hereditary and idiopathic peripheral neuropathy     Past Surgical History:  Procedure Laterality Date  . APPENDECTOMY  ~ 1956  . ATRIAL FLUTTER ABLATION N/A 05/03/2012   Procedure: ATRIAL FLUTTER ABLATION;  Surgeon: Evans Lance, MD;  Location: [facility] CATH LAB;  Service: Cardiovascular;  Laterality: N/A;  . CARDIAC CATHETERIZATION  08/1999; 05/03/2012   normal coronary arteries;   . CARPAL TUNNEL WITH CUBITAL TUNNEL  2004   "right" (05/03/2012)  . COLONOSCOPY  06/06/2008   severe sigmoid diverticulosis and internal hemorrhoids  . ESOPHAGOGASTRODUODENOSCOPY  06/06/2008   normal  . INGUINAL HERNIA REPAIR  03/23/11   "left" (05/03/2012)  . POSTERIOR LUMBAR FUSION  1989  . reconstruction (r) foot surgery  DR SUE   . RENAL CYST EXCISION     pt denies this hx on 05/03/2012  . [place]  . SPINE SURGERY  1969   Pantopaque Myelography and spinal infusion- Dr. Lyman Speller  . TONSILLECTOMY  ~ 1946  . TOTAL HIP ARTHROPLASTY  1999-2000   bilateral    Patient Care Team: Estill Dooms, MD as PCP - General (Internal Medicine)  Social History   Social History  . Marital status: Married    Spouse name: N/A  . Number of children: N/A  . Years of education: N/A   Occupational History  . Not on file.   Social History Main Topics  . Smoking status: Former Smoker    Packs/day: 2.00    Years: 5.00    Types: Cigarettes  . Smokeless tobacco: Never Used     Comment:  05/03/2012 "smoked from age 28 to 64"  . Alcohol use 0.0 oz/week  . Drug use: No  . Sexual activity: Yes    Partners: Female   Other Topics Concern  . Not on file   Social History Narrative   ** Merged History Encounter **         reports that he has quit smoking. His smoking use included Cigarettes. He has a 10.00 pack-year smoking history. He has never used smokeless tobacco. He reports that he drinks alcohol. He reports that he does not use drugs.  Family History  Problem Relation Age of Onset  . Hypertension Father   . Colon cancer Neg Hx    Family Status  Relation Status  . Father Deceased at age 31   Cause of Death: Pneumonia  . Mother Deceased at age 108   Cause of Death: Hepatitis  . Sister Alive  . Daughter Alive  . Son Alive  . Son Alive  . Neg Hx      Allergies  Allergen Reactions  . Amitriptyline   . Azithromycin     Stomach pain  . Flexeril [Cyclobenzaprine]     Causes dizziness    Medications: Patient's Medications  New Prescriptions   No medications on file  Previous Medications   ASPIRIN EC 81 MG TABLET    Take 1 tablet (81 mg total) by mouth daily.   HYDROCHLOROTHIAZIDE (HYDRODIURIL) 25 MG TABLET    TAKE 1/2 TABLET BY MOUTH TWICE DAILY FOR BLOOD PRESSURE   HYDROCODONE-IBUPROFEN (VICOPROFEN) 7.5-200 MG TABLET    Take one tablet by mouth up to four times daily as needed for pain.   LOSARTAN-HYDROCHLOROTHIAZIDE (HYZAAR) 100-25 MG TABLET    TAKE ONE TABLET BY MOUTH ONCE DAILY TO CONTROL BLOOD PRESSURE   ONDANSETRON (ZOFRAN) 4 MG TABLET    TAKE 1 TABLET BY MOUTH EVERY 8 HOURS AS NEEDED FOR NAUSEA   OXYCODONE (ROXICODONE) 30 MG IMMEDIATE RELEASE TABLET    One every 3 hours if needed to control pain   PREDNISONE (DELTASONE) 5 MG TABLET    Take two tablets by mouth each morning to help arthritis  Modified Medications   No medications on file  Discontinued Medications   NYSTATIN (MYCOSTATIN) 100000 UNIT/ML SUSPENSION    70ml by mouth four times daily,  swish and spit    Review of Systems  Skin: Positive for wound.  All other systems reviewed and are negative.   Vitals:   01/02/16 1530  BP: (!) 144/90  Pulse: 60  Temp: 98 F (36.7 C)  TempSrc: Oral  SpO2: 97%  Weight: 261 lb (118.4  kg)  Height: 6\' 3"  (1.905 m)   Body mass index is 32.62 kg/m.  Physical Exam  Constitutional: He appears well-developed and well-nourished. No distress.  Cardiovascular:  (+) left ulnar and radial pulses intact; grip strength intact. No swelling in b/l UE  Musculoskeletal: He exhibits edema and tenderness.  Grip strength intact b/l.   Neurological: He is alert.  Skin: Skin is warm and dry.     Psychiatric: He has a normal mood and affect. His behavior is normal.     Labs reviewed: Office Visit on 11/25/2015  Component Date Value Ref Range Status  . Sed Rate 11/26/2015 5  0 - 30 mm/hr Final  . Total CK 11/26/2015 100  24 - 204 U/L Final  . TSH 11/26/2015 1.520  0.450 - 4.500 uIU/mL Final  Appointment on 11/21/2015  Component Date Value Ref Range Status  . Glucose 11/22/2015 90  65 - 99 mg/dL Final  . BUN 11/22/2015 21  8 - 27 mg/dL Final  . Creatinine, Ser 11/22/2015 1.12  0.76 - 1.27 mg/dL Final  . GFR calc non Af Amer 11/22/2015 64  >59 mL/min/1.73 Final  . GFR calc Af Amer 11/22/2015 74  >59 mL/min/1.73 Final  . BUN/Creatinine Ratio 11/22/2015 19  10 - 24 Final  . Sodium 11/22/2015 140  134 - 144 mmol/L Final  . Potassium 11/22/2015 4.4  3.5 - 5.2 mmol/L Final  . Chloride 11/22/2015 100  96 - 106 mmol/L Final  . CO2 11/22/2015 24  18 - 29 mmol/L Final  . Calcium 11/22/2015 9.5  8.6 - 10.2 mg/dL Final    No results found.   Assessment/Plan   ICD-9-CM ICD-10-CM   1. Infected abrasion vs skin tear 919.1 L08.9 cephALEXin (KEFLEX) 500 MG capsule   left forearm    Wound education handout given  Clean wound with warm soapy water using Dial antibacterial soap daily. Cover with dry nonstick dressing daily  Take antibiotic 2  times daily x 10 days  Take probiotic (yogurt or culturelle) daily while on antibiotic  Continue other medications as ordered  Return/call office if no better or worsens in 48-72 hrs  Follow up with Dr Nyoka Cowden as scheduled  Cordella Register. Perlie Gold  [facility] [address] [place], [place] [address] [phone] Cell (Monday-Friday 8 AM - 5 PM) [phone] After 5 PM and follow prompts

## 2016-01-05 ENCOUNTER — Telehealth: Payer: Self-pay

## 2016-01-05 MED ORDER — TRIAMCINOLONE ACETONIDE 0.1 % EX CREA: 1.0000 "application " | TOPICAL_CREAM | Freq: Two times a day (BID) | CUTANEOUS | 0 refills | Status: DC

## 2016-01-05 NOTE — Telephone Encounter (Signed)
:   Prescription for triamcinolone acetonide 0.1% cream (15 grams) Apply twice daily to lesion until improved.

## 2016-01-05 NOTE — Telephone Encounter (Signed)
RX sent, patient aware  

## 2016-01-05 NOTE — Telephone Encounter (Signed)
Patient called to request medication for bug bite. Patient was bitten by green-looking bug. Patient is now itchy, with raised spots ,and redness. Patient denies fever or drainage. Patient has tried several OTC cream with no relief.  Please advise if rx to be sent in or if patient needs to be seen.

## 2016-01-06 ENCOUNTER — Encounter: Payer: Self-pay | Admitting: Internal Medicine

## 2016-01-06 ENCOUNTER — Ambulatory Visit (INDEPENDENT_AMBULATORY_CARE_PROVIDER_SITE_OTHER): Payer: Medicare Other | Admitting: Internal Medicine

## 2016-01-06 DIAGNOSIS — R21 Rash and other nonspecific skin eruption: Secondary | ICD-10-CM | POA: Diagnosis not present

## 2016-01-06 DIAGNOSIS — S51802D Unspecified open wound of left forearm, subsequent encounter: Secondary | ICD-10-CM | POA: Diagnosis not present

## 2016-01-06 DIAGNOSIS — S51809A Unspecified open wound of unspecified forearm, initial encounter: Secondary | ICD-10-CM | POA: Insufficient documentation

## 2016-01-06 MED ORDER — DIPHENHYDRAMINE HCL 2 % EX GEL: CUTANEOUS | 0 refills | Status: DC

## 2016-01-06 NOTE — Progress Notes (Signed)
Facility  [facility]    Place of Service:   OFFICE    Allergies  Allergen Reactions  . Amitriptyline   . Azithromycin     Stomach pain  . Flexeril [Cyclobenzaprine]     Causes dizziness    Chief Complaint  Patient presents with  . Acute Visit    recheck left forearm, not healing  . Other    bites on chest all over, speading since using Triamcinolone 0.1% cream    HPI:  Seen 01/02/16 for infected abrasion of the left forearm. Was started on Cephalexin 500 mg bid. Wound is no longer infected, but is open like a burn.  On 01/05/16 he called to report a bug bite. I recommended TCA 0.1% cream bid. Was driving. Bit in several places. Initially had a welt and then a spreading rash.   Medications: Patient's Medications  New Prescriptions   No medications on file  Previous Medications   ASPIRIN EC 81 MG TABLET    Take 1 tablet (81 mg total) by mouth daily.   CEPHALEXIN (KEFLEX) 500 MG CAPSULE    Take 1 capsule (500 mg total) by mouth 2 (two) times daily.   HYDROCHLOROTHIAZIDE (HYDRODIURIL) 25 MG TABLET    TAKE 1/2 TABLET BY MOUTH TWICE DAILY FOR BLOOD PRESSURE   HYDROCODONE-IBUPROFEN (VICOPROFEN) 7.5-200 MG TABLET    Take one tablet by mouth up to four times daily as needed for pain.   LOSARTAN-HYDROCHLOROTHIAZIDE (HYZAAR) 100-25 MG TABLET    TAKE ONE TABLET BY MOUTH ONCE DAILY TO CONTROL BLOOD PRESSURE   ONDANSETRON (ZOFRAN) 4 MG TABLET    TAKE 1 TABLET BY MOUTH EVERY 8 HOURS AS NEEDED FOR NAUSEA   OXYCODONE (ROXICODONE) 30 MG IMMEDIATE RELEASE TABLET    One every 3 hours if needed to control pain   PREDNISONE (DELTASONE) 5 MG TABLET    Take two tablets by mouth each morning to help arthritis   TRIAMCINOLONE CREAM (KENALOG) 0.1 %    Apply 1 application topically 2 (two) times daily.  Modified Medications   No medications on file  Discontinued Medications   No medications on file    Review of Systems  Constitutional: Positive for appetite change and fatigue. Negative for chills and  fever.  HENT: Positive for hearing loss. Negative for ear pain, sinus pressure, trouble swallowing and voice change.   Eyes: Negative.   Cardiovascular:       Hx hypertension.   Gastrointestinal: Negative for abdominal distention, blood in stool, constipation and diarrhea.  Endocrine: Negative.   Genitourinary: Negative.   Musculoskeletal:       Increased shoulder pains. Painful to raise or rotate shoulders.  Skin: Positive for rash.       Painless atrophic plaque of the left forearm. Skin tearing wound of the left forearm present for at least 2 weeks. No infection. Spreading rash across the chest. Welt at the left biceps area.  Neurological: Negative.   Hematological: Negative.   Psychiatric/Behavioral: Positive for dysphoric mood and sleep disturbance (Insomnia). Negative for self-injury and suicidal ideas. The patient is nervous/anxious.     Vitals:   01/06/16 1334  BP: (!) 174/92  Pulse: (!) 56  Temp: 97.8 F (36.6 C)  TempSrc: Oral  SpO2: 97%  Weight: 260 lb (117.9 kg)  Height: '6\' 3"'  (1.905 m)   Body mass index is 32.5 kg/m. Wt Readings from Last 3 Encounters:  01/06/16 260 lb (117.9 kg)  01/02/16 261 lb (118.4 kg)  11/25/15 260 lb (  117.9 kg)      Physical Exam  Constitutional: He is oriented to person, place, and time. He appears well-developed and well-nourished. No distress.  Moderately overweight  HENT:  Head: Normocephalic and atraumatic.  Significant partial deafness bilaterally.  Eyes: Conjunctivae and EOM are normal. Pupils are equal, round, and reactive to light.  Neck: Normal range of motion. Neck supple. No JVD present. No tracheal deviation present. No thyromegaly present.  Cardiovascular: Normal rate, regular rhythm, normal heart sounds and intact distal pulses.  Exam reveals no gallop and no friction rub.   No murmur heard. Pulmonary/Chest: Effort normal and breath sounds normal. No respiratory distress. He has no wheezes. He has no rales. He  exhibits no tenderness.  Abdominal: Soft. Bowel sounds are normal. He exhibits no distension and no mass. There is no tenderness.  Genitourinary: Rectum normal, prostate normal and penis normal. Rectal exam shows guaiac negative stool.  Musculoskeletal: He exhibits no edema or tenderness.  Hammer toe right 3rd. Painful shoulders with elevation or rotation. Pain in both knees. Pushes off to get out of a chair. Unable to fully squat and come back up.  Lymphadenopathy:    He has no cervical adenopathy.  Neurological: He is alert and oriented to person, place, and time. No cranial nerve deficit.  Skin: Skin is dry. Rash (A spreading erythematous rash across the anterior chest. Welts on the left biceps area. Open wound of the left forearm secondary traumatic injury. No infection noted.) noted. No erythema. No pallor.  Atrophic plaque of the left forearm. Skin tear/laceration of the left forearm. Surrounding bruising. No purulence. Irritation of the skin surrounding this wound secondary to either bandages or Neosporin reaction.  Psychiatric: His speech is normal and behavior is normal. Judgment and thought content normal. Cognition and memory are normal. He exhibits a depressed mood.  Anxiety and depressed mood.    Labs reviewed: Lab Summary Latest Ref Rng & Units 11/21/2015  Hemoglobin - (None)  Hematocrit - (None)  White count - (None)  Platelet count - (None)  Sodium 134 - 144 mmol/L 140  Potassium 3.5 - 5.2 mmol/L 4.4  Calcium 8.6 - 10.2 mg/dL 9.5  Phosphorus - (None)  Creatinine 0.76 - 1.27 mg/dL 1.12  AST - (None)  Alk Phos - (None)  Bilirubin - (None)  Glucose 65 - 99 mg/dL 90  Cholesterol - (None)  HDL cholesterol - (None)  Triglycerides - (None)  LDL Direct - (None)  LDL Calc - (None)  Total protein - (None)  Albumin - (None)  Some recent data might be hidden   Lab Results  Component Value Date   TSH 1.520 11/25/2015   TSH 1.57 03/22/2012   TSH 1.219 03/23/2011   Lab  Results  Component Value Date   BUN 21 11/21/2015   BUN 22 02/24/2015   BUN 18 10/18/2013   Lab Results  Component Value Date   HGBA1C (H) 08/10/2010    5.8 (NOTE)                                                                       According to the ADA Clinical Practice Recommendations for 2011, when HbA1c is used as a screening test:   >=6.5%   Diagnostic  of Diabetes Mellitus           (if abnormal result  is confirmed)  5.7-6.4%   Increased risk of developing Diabetes Mellitus  References:Diagnosis and Classification of Diabetes Mellitus,Diabetes QWQV,7944,46(FJUVQ 1):S62-S69 and Standards of Medical Care in         Diabetes - 2011,Diabetes QUIV,1464,31  (Suppl 1):S11-S61.    Assessment/Plan  1. Rash and nonspecific skin eruption - DIPHENHYDRAMINE HCL, TOPICAL, (BENADRYL ITCH STOPPING) 2 % GEL; Apply up to 4 times daily to area of itchy rash  Dispense: 120 g; Refill: 0  2. Wound, open, arm, forearm, left, subsequent encounter Tegaderm bandage applied

## 2016-01-07 ENCOUNTER — Telehealth: Payer: Self-pay | Admitting: *Deleted

## 2016-01-07 NOTE — Telephone Encounter (Signed)
Patient notified and agreed.  

## 2016-01-07 NOTE — Telephone Encounter (Signed)
Leave the bandage on forearm in place. I believe the spreading of the rash is related to the bottom of the insect that bit him. It may be worse for a while before it gets better. He can use either the Benadryl gel or the triamcinolone cream that was initially ordered. There is really nothing else to do.

## 2016-01-07 NOTE — Telephone Encounter (Signed)
[name] [name], wife called and stated that the rash you saw yesterday is spreading. And the bandage you put on his arm is all bloody but not seeping out of bandage, should they leave the bandage on for a week.  Wife wanted to let you know this and wanted to know if they should do anything or just wait and be patient. Please Advise.

## 2016-01-08 ENCOUNTER — Ambulatory Visit: Payer: Medicare Other | Admitting: Internal Medicine

## 2016-01-14 ENCOUNTER — Ambulatory Visit (INDEPENDENT_AMBULATORY_CARE_PROVIDER_SITE_OTHER): Payer: Medicare Other | Admitting: Internal Medicine

## 2016-01-14 ENCOUNTER — Other Ambulatory Visit: Payer: Self-pay | Admitting: Internal Medicine

## 2016-01-14 ENCOUNTER — Encounter: Payer: Self-pay | Admitting: Internal Medicine

## 2016-01-14 VITALS — BP 160/100 | HR 61 | Temp 98.2°F | Ht 75.0 in | Wt 263.0 lb

## 2016-01-14 DIAGNOSIS — S41112A Laceration without foreign body of left upper arm, initial encounter: Secondary | ICD-10-CM

## 2016-01-14 DIAGNOSIS — R21 Rash and other nonspecific skin eruption: Secondary | ICD-10-CM

## 2016-01-14 NOTE — Progress Notes (Signed)
Facility  [facility]    Place of Service:   OFFICE    Allergies  Allergen Reactions  . Amitriptyline   . Azithromycin     Stomach pain  . Flexeril [Cyclobenzaprine]     Causes dizziness    Chief Complaint  Patient presents with  . Medical Management of Chronic Issues    one week follow-up on left arm wound, rash on chest. Neither one has gotten any better.    HPI:  Laceration of arm, left, initial encounter - healing  Rash and nonspecific skin eruption - improving    Medications: Patient's Medications  New Prescriptions   No medications on file  Previous Medications   ASPIRIN EC 81 MG TABLET    Take 1 tablet (81 mg total) by mouth daily.   DIPHENHYDRAMINE HCL, TOPICAL, (BENADRYL ITCH STOPPING) 2 % GEL    Apply up to 4 times daily to area of itchy rash   HYDROCHLOROTHIAZIDE (HYDRODIURIL) 25 MG TABLET    TAKE 1/2 TABLET BY MOUTH TWICE DAILY FOR BLOOD PRESSURE   HYDROCODONE-IBUPROFEN (VICOPROFEN) 7.5-200 MG TABLET    Take one tablet by mouth up to four times daily as needed for pain.   LOSARTAN-HYDROCHLOROTHIAZIDE (HYZAAR) 100-25 MG TABLET    TAKE ONE TABLET BY MOUTH ONCE DAILY TO CONTROL BLOOD PRESSURE   ONDANSETRON (ZOFRAN) 4 MG TABLET    TAKE 1 TABLET BY MOUTH EVERY 8 HOURS AS NEEDED FOR NAUSEA   OXYCODONE (ROXICODONE) 30 MG IMMEDIATE RELEASE TABLET    One every 3 hours if needed to control pain   PREDNISONE (DELTASONE) 5 MG TABLET    Take two tablets by mouth each morning to help arthritis   TRIAMCINOLONE CREAM (KENALOG) 0.1 %    Apply 1 application topically 2 (two) times daily.  Modified Medications   No medications on file  Discontinued Medications   CEPHALEXIN (KEFLEX) 500 MG CAPSULE    Take 1 capsule (500 mg total) by mouth 2 (two) times daily.    Review of Systems  Constitutional: Positive for appetite change and fatigue. Negative for chills and fever.  HENT: Positive for hearing loss. Negative for ear pain, sinus pressure, trouble swallowing and voice change.    Eyes: Negative.   Cardiovascular:       Hx hypertension.   Gastrointestinal: Negative for abdominal distention, blood in stool, constipation and diarrhea.  Endocrine: Negative.   Genitourinary: Negative.   Musculoskeletal:       Increased shoulder pains. Painful to raise or rotate shoulders.  Skin: Positive for rash.       Painless atrophic plaque of the left forearm. Healing skin tear wound of the left forearm present for at least 2 weeks. No infection. Spreading rash across the chest and the left biceps area.  Neurological: Negative.   Hematological: Negative.   Psychiatric/Behavioral: Positive for dysphoric mood and sleep disturbance (Insomnia). Negative for self-injury and suicidal ideas. The patient is nervous/anxious.     Vitals:   01/14/16 1440  BP: (!) 160/100  Pulse: 61  Temp: 98.2 F (36.8 C)  TempSrc: Oral  SpO2: 96%  Weight: 263 lb (119.3 kg)  Height: _0  (1.905 m)   Body mass index is 32.87 kg/m. Wt Readings from Last 3 Encounters:  01/14/16 263 lb (119.3 kg)  01/06/16 260 lb (117.9 kg)  01/02/16 261 lb (118.4 kg)      Physical Exam  Constitutional: He is oriented to person, place, and time. He appears well-developed and well-nourished. No distress.  Moderately overweight  HENT:  Head: Normocephalic and atraumatic.  Significant partial deafness bilaterally.  Eyes: Conjunctivae and EOM are normal. Pupils are equal, round, and reactive to light.  Neck: Normal range of motion. Neck supple. No JVD present. No tracheal deviation present. No thyromegaly present.  Cardiovascular: Normal rate, regular rhythm, normal heart sounds and intact distal pulses.  Exam reveals no gallop and no friction rub.   No murmur heard. Pulmonary/Chest: Effort normal and breath sounds normal. No respiratory distress. He has no wheezes. He has no rales. He exhibits no tenderness.  Abdominal: Soft. Bowel sounds are normal. He exhibits no distension and no mass. There is no  tenderness.  Genitourinary: Rectum normal, prostate normal and penis normal. Rectal exam shows guaiac negative stool.  Musculoskeletal: He exhibits no edema or tenderness.  Hammer toe right 3rd. Painful shoulders with elevation or rotation. Pain in both knees. Pushes off to get out of a chair. Unable to fully squat and come back up.  Lymphadenopathy:    He has no cervical adenopathy.  Neurological: He is alert and oriented to person, place, and time. No cranial nerve deficit.  Skin: Rash (A spreading erythematous rash across the anterior chest. Welts on the left biceps area. Open wound of the left forearm secondary traumatic injury. No infection noted.) noted. There is erythema. No pallor.  Atrophic plaque of the left forearm. Skin tear/laceration of the left forearm.   Psychiatric: His speech is normal and behavior is normal. Judgment and thought content normal. Cognition and memory are normal. He exhibits a depressed mood.  Anxiety and depressed mood.    Labs reviewed: Lab Summary Latest Ref Rng & Units 11/21/2015  Hemoglobin - (None)  Hematocrit - (None)  White count - (None)  Platelet count - (None)  Sodium 134 - 144 mmol/L 140  Potassium 3.5 - 5.2 mmol/L 4.4  Calcium 8.6 - 10.2 mg/dL 9.5  Phosphorus - (None)  Creatinine 0.76 - 1.27 mg/dL 1.12  AST - (None)  Alk Phos - (None)  Bilirubin - (None)  Glucose 65 - 99 mg/dL 90  Cholesterol - (None)  HDL cholesterol - (None)  Triglycerides - (None)  LDL Direct - (None)  LDL Calc - (None)  Total protein - (None)  Albumin - (None)  Some recent data might be hidden   Lab Results  Component Value Date   TSH 1.520 11/25/2015   TSH 1.57 03/22/2012   TSH 1.219 03/23/2011   Lab Results  Component Value Date   BUN 21 11/21/2015   BUN 22 02/24/2015   BUN 18 10/18/2013   Lab Results  Component Value Date   HGBA1C (H) 08/10/2010    5.8 (NOTE)                                                                       According to  the ADA Clinical Practice Recommendations for 2011, when HbA1c is used as a screening test:   >=6.5%   Diagnostic of Diabetes Mellitus           (if abnormal result  is confirmed)  5.7-6.4%   Increased risk of developing Diabetes Mellitus  References:Diagnosis and Classification of Diabetes Mellitus,Diabetes TGPQ,9826,41(RAXEN 1):S62-S69 and Standards of Medical Care in  Diabetes - 2011,Diabetes Care,2011,34  (Suppl 1):S11-S61.    Assessment/Plan  1. Laceration of arm, left, initial encounter Healing. Redressed.  2. Rash and nonspecific skin eruption Slowly improving

## 2016-01-17 ENCOUNTER — Other Ambulatory Visit: Payer: Self-pay | Admitting: Internal Medicine

## 2016-01-19 ENCOUNTER — Other Ambulatory Visit: Payer: Self-pay | Admitting: Internal Medicine

## 2016-01-19 ENCOUNTER — Telehealth: Payer: Self-pay | Admitting: *Deleted

## 2016-01-19 NOTE — Telephone Encounter (Signed)
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

## 2016-01-19 NOTE — Telephone Encounter (Signed)
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

## 2016-01-19 NOTE — Telephone Encounter (Signed)
Letter composed and left up front for pick up

## 2016-01-21 ENCOUNTER — Telehealth: Payer: Self-pay

## 2016-01-21 DIAGNOSIS — R21 Rash and other nonspecific skin eruption: Secondary | ICD-10-CM

## 2016-01-21 NOTE — Telephone Encounter (Signed)
See if Dr. Syble Creek can see him sooner.

## 2016-01-21 NOTE — Telephone Encounter (Signed)
Discussed with patient, referral order placed

## 2016-01-21 NOTE — Telephone Encounter (Signed)
Message left on triage voicemail: Patient would like a referral to a dermatologist. Patient called [facility] and was told first available was last October. Patient would like to know if Dr.Green would recommend another dermatology group that he could get in with sooner.   Please advise

## 2016-01-27 ENCOUNTER — Encounter: Payer: Self-pay | Admitting: Internal Medicine

## 2016-01-27 ENCOUNTER — Ambulatory Visit (INDEPENDENT_AMBULATORY_CARE_PROVIDER_SITE_OTHER): Payer: Medicare Other | Admitting: Internal Medicine

## 2016-01-27 VITALS — BP 146/80 | HR 69 | Temp 98.2°F | Ht 75.0 in | Wt 263.0 lb

## 2016-01-27 DIAGNOSIS — I10 Essential (primary) hypertension: Secondary | ICD-10-CM | POA: Diagnosis not present

## 2016-01-27 DIAGNOSIS — R21 Rash and other nonspecific skin eruption: Secondary | ICD-10-CM

## 2016-01-27 DIAGNOSIS — N182 Chronic kidney disease, stage 2 (mild): Secondary | ICD-10-CM

## 2016-01-27 DIAGNOSIS — S41112A Laceration without foreign body of left upper arm, initial encounter: Secondary | ICD-10-CM | POA: Diagnosis not present

## 2016-01-27 DIAGNOSIS — Z23 Encounter for immunization: Secondary | ICD-10-CM

## 2016-01-27 NOTE — Progress Notes (Signed)
Facility  [facility]    Place of Service:   OFFICE    Allergies  Allergen Reactions  . Amitriptyline   . Azithromycin     Stomach pain  . Flexeril [Cyclobenzaprine]     Causes dizziness    Chief Complaint  Patient presents with  . Medical Management of Chronic Issues    left arm laceration, skin rash    HPI:  Laceration of arm, left, initial encounter - healed  Rash and nonspecific skin eruption - healing. Still has a lot of itching after warm shower.    Medications: Patient's Medications  New Prescriptions   No medications on file  Previous Medications   ASPIRIN EC 81 MG TABLET    Take 1 tablet (81 mg total) by mouth daily.   DIPHENHYDRAMINE HCL, TOPICAL, (BENADRYL ITCH STOPPING) 2 % GEL    Apply up to 4 times daily to area of itchy rash   HYDROCHLOROTHIAZIDE (HYDRODIURIL) 25 MG TABLET    TAKE 1/2 TABLET BY MOUTH TWICE DAILY FOR BLOOD PRESSURE   HYDROCODONE-IBUPROFEN (VICOPROFEN) 7.5-200 MG TABLET    Take one tablet by mouth up to four times daily as needed for pain.   LOSARTAN-HYDROCHLOROTHIAZIDE (HYZAAR) 100-25 MG TABLET    TAKE ONE TABLET BY MOUTH ONCE DAILY TO CONTROL BLOOD PRESSURE   ONDANSETRON (ZOFRAN) 4 MG TABLET    TAKE 1 TABLET BY MOUTH EVERY 8 HOURS AS NEEDED FOR NAUSEA   OXYCODONE (ROXICODONE) 30 MG IMMEDIATE RELEASE TABLET    One every 3 hours if needed to control pain   PREDNISONE (DELTASONE) 5 MG TABLET    TAKE 2 TABLETS BY MOUTH EVERY MORNING TO HELP ARTHRITIS   TRIAMCINOLONE CREAM (KENALOG) 0.1 %    APPLY 1 APPLICATION TOPICALLY 2 (TWO) TIMES DAILY.  Modified Medications   No medications on file  Discontinued Medications   ONDANSETRON (ZOFRAN) 4 MG TABLET    TAKE 1 TABLET BY MOUTH EVERY 8 HOURS AS NEEDED FOR NAUSEA    Review of Systems  Constitutional: Positive for appetite change and fatigue. Negative for chills and fever.  HENT: Positive for hearing loss. Negative for ear pain, sinus pressure, trouble swallowing and voice change.   Eyes: Negative.    Cardiovascular:       Hx hypertension.   Gastrointestinal: Negative for abdominal distention, blood in stool, constipation and diarrhea.  Endocrine: Negative.   Genitourinary: Negative.   Musculoskeletal:       Increased shoulder pains. Painful to raise or rotate shoulders.  Skin: Positive for rash.       Painless atrophic plaque of the left forearm. Healed skin tear wound of the left forearm..  Neurological: Negative.   Hematological: Negative.   Psychiatric/Behavioral: Positive for dysphoric mood and sleep disturbance (Insomnia). Negative for self-injury and suicidal ideas. The patient is nervous/anxious.     Vitals:   01/27/16 1403  BP: (!) 146/80  Pulse: 69  Temp: 98.2 F (36.8 C)  TempSrc: Oral  SpO2: 96%  Weight: 263 lb (119.3 kg)  Height: _0  (1.905 m)   Body mass index is 32.87 kg/m. Wt Readings from Last 3 Encounters:  01/27/16 263 lb (119.3 kg)  01/14/16 263 lb (119.3 kg)  01/06/16 260 lb (117.9 kg)      Physical Exam  Constitutional: He is oriented to person, place, and time. He appears well-developed and well-nourished. No distress.  Moderately overweight  HENT:  Head: Normocephalic and atraumatic.  Significant partial deafness bilaterally.  Eyes: Conjunctivae and EOM  are normal. Pupils are equal, round, and reactive to light.  Neck: Normal range of motion. Neck supple. No JVD present. No tracheal deviation present. No thyromegaly present.  Cardiovascular: Normal rate, regular rhythm, normal heart sounds and intact distal pulses.  Exam reveals no gallop and no friction rub.   No murmur heard. Pulmonary/Chest: Effort normal and breath sounds normal. No respiratory distress. He has no wheezes. He has no rales. He exhibits no tenderness.  Abdominal: Soft. Bowel sounds are normal. He exhibits no distension and no mass. There is no tenderness.  Genitourinary: Rectum normal, prostate normal and penis normal. Rectal exam shows guaiac negative stool.    Musculoskeletal: He exhibits no edema or tenderness.  Hammer toe right 3rd. Painful shoulders with elevation or rotation. Pain in both knees. Pushes off to get out of a chair. Unable to fully squat and come back up.  Lymphadenopathy:    He has no cervical adenopathy.  Neurological: He is alert and oriented to person, place, and time. No cranial nerve deficit.  Skin: Rash noted. There is erythema. No pallor.  Atrophic plaque of the left forearm. Healed laceration of the left arm   Psychiatric: His speech is normal and behavior is normal. Judgment and thought content normal. Cognition and memory are normal. He exhibits a depressed mood.  Anxiety and depressed mood.    Labs reviewed: Lab Summary Latest Ref Rng & Units 11/21/2015  Hemoglobin - (None)  Hematocrit - (None)  White count - (None)  Platelet count - (None)  Sodium 134 - 144 mmol/L 140  Potassium 3.5 - 5.2 mmol/L 4.4  Calcium 8.6 - 10.2 mg/dL 9.5  Phosphorus - (None)  Creatinine 0.76 - 1.27 mg/dL 1.12  AST - (None)  Alk Phos - (None)  Bilirubin - (None)  Glucose 65 - 99 mg/dL 90  Cholesterol - (None)  HDL cholesterol - (None)  Triglycerides - (None)  LDL Direct - (None)  LDL Calc - (None)  Total protein - (None)  Albumin - (None)  Some recent data might be hidden   Lab Results  Component Value Date   TSH 1.520 11/25/2015   TSH 1.57 03/22/2012   TSH 1.219 03/23/2011   Lab Results  Component Value Date   BUN 21 11/21/2015   BUN 22 02/24/2015   BUN 18 10/18/2013   Lab Results  Component Value Date   HGBA1C (H) 08/10/2010    5.8 (NOTE)                                                                       According to the ADA Clinical Practice Recommendations for 2011, when HbA1c is used as a screening test:   >=6.5%   Diagnostic of Diabetes Mellitus           (if abnormal result  is confirmed)  5.7-6.4%   Increased risk of developing Diabetes Mellitus  References:Diagnosis and Classification of Diabetes  [place],Diabetes FOYD,7412,87(OMVEH 1):S62-S69 and Standards of Medical Care in         Diabetes - 2011,Diabetes Care,2011,34  (Suppl 1):S11-S61.    Assessment/Plan  1. Laceration of arm, left, initial encounter resolved  2. Rash and nonspecific skin eruption resolving

## 2016-02-03 ENCOUNTER — Other Ambulatory Visit: Payer: Self-pay | Admitting: *Deleted

## 2016-02-03 DIAGNOSIS — M75101 Unspecified rotator cuff tear or rupture of right shoulder, not specified as traumatic: Principal | ICD-10-CM

## 2016-02-03 DIAGNOSIS — M12811 Other specific arthropathies, not elsewhere classified, right shoulder: Secondary | ICD-10-CM

## 2016-02-03 MED ORDER — OXYCODONE HCL 30 MG PO TABS
ORAL_TABLET | ORAL | 0 refills | Status: DC
Start: 2016-02-03 — End: 2016-03-04

## 2016-02-03 MED ORDER — HYDROCODONE-IBUPROFEN 7.5-200 MG PO TABS
ORAL_TABLET | ORAL | 0 refills | Status: DC
Start: 2016-02-03 — End: 2016-03-04

## 2016-02-03 NOTE — Telephone Encounter (Signed)
Patient requested and will pick up 

## 2016-02-06 ENCOUNTER — Telehealth: Payer: Self-pay | Admitting: *Deleted

## 2016-02-06 NOTE — Telephone Encounter (Signed)
Patient called and stated that [facility] would not [facility]. I called [facility] [phone] and spoke with pharmacist and she stated that it needs prior authorization. Called [phone] ID#: [id number] and spoke with [facility] and it went into Clinical Review. Patient Notified.

## 2016-02-10 NOTE — Telephone Encounter (Signed)
Received [facility] Fax form for Prior Authorization for Hydrocodone 7.5/200. Given to Dr. Nyoka Cowden to review and sign. To be faxed back to Fax#: [phone]

## 2016-02-18 DIAGNOSIS — M1711 Unilateral primary osteoarthritis, right knee: Secondary | ICD-10-CM | POA: Diagnosis not present

## 2016-02-19 ENCOUNTER — Telehealth: Payer: Self-pay

## 2016-02-19 NOTE — Telephone Encounter (Signed)
Letter received from [facility]) indicating: We decided to approve the Morphine Equivalent Dose (MED) threshold limit of 390.00 mg for Hydrocodone/Ibuprofen after receiving appeal on 02/10/16.  Letter placed in Dr.Green's review and sign folder.

## 2016-03-04 ENCOUNTER — Other Ambulatory Visit: Payer: Self-pay

## 2016-03-04 DIAGNOSIS — M12811 Other specific arthropathies, not elsewhere classified, right shoulder: Secondary | ICD-10-CM

## 2016-03-04 DIAGNOSIS — M75101 Unspecified rotator cuff tear or rupture of right shoulder, not specified as traumatic: Principal | ICD-10-CM

## 2016-03-04 MED ORDER — OXYCODONE HCL 30 MG PO TABS: ORAL_TABLET | ORAL | 0 refills | Status: DC

## 2016-03-04 MED ORDER — HYDROCODONE-IBUPROFEN 7.5-200 MG PO TABS
ORAL_TABLET | ORAL | 0 refills | Status: DC
Start: 2016-03-04 — End: 2016-03-22

## 2016-03-05 ENCOUNTER — Telehealth: Payer: Self-pay

## 2016-03-05 NOTE — Telephone Encounter (Signed)
[facility] would like [facility] detailed explanation as to why patient is on Hydrocodone and Oxycodone. [facility] is concerned that patient is over medicated.  Please advise

## 2016-03-08 NOTE — Telephone Encounter (Signed)
Left message on voicemail for the pharmacist with Dr.Green's response. Diagnosis is M25.519

## 2016-03-08 NOTE — Telephone Encounter (Signed)
He has chronic shoulder pains of variable intensity. Usually able to control with hydrocodone, but sometimes needs something stronger, so he has oxycodone to be used in that case.

## 2016-03-11 ENCOUNTER — Telehealth: Payer: Self-pay

## 2016-03-11 NOTE — Telephone Encounter (Signed)
[name] [address] [facility] needs to speak directly to [name] [name]. [facility] not [facility] refilling controlled substances for patient. Pharmacist feels patient is over medicated. [facility] received the message where Dr.Green ok'd for patient to be on both medications and states they will not fill unless they speak with Dr.Green

## 2016-03-19 NOTE — Telephone Encounter (Signed)
I spoke to the pharmacist and to [name] [name]. We agreed to stop his hydrocodone and to continue the oxycodone.

## 2016-03-19 NOTE — Telephone Encounter (Signed)
Dr.Green please advise if you spoke with the pharmacy

## 2016-04-02 ENCOUNTER — Ambulatory Visit (INDEPENDENT_AMBULATORY_CARE_PROVIDER_SITE_OTHER): Payer: Medicare Other | Admitting: Internal Medicine

## 2016-04-02 ENCOUNTER — Other Ambulatory Visit: Payer: Self-pay | Admitting: *Deleted

## 2016-04-02 ENCOUNTER — Encounter: Payer: Self-pay | Admitting: Internal Medicine

## 2016-04-02 VITALS — BP 140/60 | HR 60 | Wt 261.0 lb

## 2016-04-02 DIAGNOSIS — Z13220 Encounter for screening for lipoid disorders: Secondary | ICD-10-CM | POA: Diagnosis not present

## 2016-04-02 DIAGNOSIS — E669 Obesity, unspecified: Secondary | ICD-10-CM | POA: Diagnosis not present

## 2016-04-02 DIAGNOSIS — M75101 Unspecified rotator cuff tear or rupture of right shoulder, not specified as traumatic: Principal | ICD-10-CM

## 2016-04-02 DIAGNOSIS — R252 Cramp and spasm: Secondary | ICD-10-CM

## 2016-04-02 DIAGNOSIS — S83429A Sprain of lateral collateral ligament of unspecified knee, initial encounter: Secondary | ICD-10-CM

## 2016-04-02 DIAGNOSIS — M12811 Other specific arthropathies, not elsewhere classified, right shoulder: Secondary | ICD-10-CM

## 2016-04-02 MED ORDER — OXYCODONE HCL 30 MG PO TABS: ORAL_TABLET | ORAL | 0 refills | Status: DC

## 2016-04-02 NOTE — Progress Notes (Signed)
Location:  [facility] clinic Provider: Karyna Bessler L. Mariea Clonts, D.O., C.M.D. PCP:  Dr. Nyoka Cowden  Code Status: DNR Goals of Care:  Advanced Directives 01/27/2016  Does patient have an advance directive? Yes  Type of Paramedic of [place];Living will  Does patient want to make changes to advanced directive? -  Copy of advanced directive(s) in chart? No - copy requested  Would patient like information on creating an advanced directive? -     Chief Complaint  Patient presents with  . Acute Visit    twisted knee x3 days    HPI: Patient is a 75 y.o. male seen today for an acute visit for a twisted knee for three days.  Was trying to retrieve an item from under the desk.  He could not get back up on his feet except to pull himself up with his arms.  Pt a lot of pressure on the right knee.  Feels very tight behind it.  He put ice on it afterwards.  It feels tight behind his right knee.    He's historically seen Dr. Marlou Sa and had xrays of the knee that showed they were "in good shape".    Muscle cramps in shoulders, stomach, legs, calves.  Start in the afternoon.  He's drinking water with quinine which is not doing much.  Gets unbearable at night.  Trying to drink a lot of water.  Has to get up and walk for 30 mins.    He requests his cholesterol be checked.  He feels he is not in good health.  He eats poorly, he does not exercise, he goes out to eat all the time.  He is anxious, doesn't sleep well and has chronic pain.    Past Medical History:  Diagnosis Date  . Allergic rhinitis due to pollen   . Allergy   . Anxiety state, unspecified   . Arthritis    "right shoulder" (05/03/2012)  . Atrial flutter ([place])    a. dx after inguinal hernia repair in 10/12 => seen by Dr. Acie Fredrickson  . Calculus of kidney   . Cervicalgia   . Cervicalgia   . Clostridium difficile colitis   . Complications affecting other specified body systems, hypertension   . Coronary atherosclerosis of unspecified  type of vessel, native or graft   . Degeneration of cervical intervertebral disc   . Depression   . Depressive disorder, not elsewhere classified   . GERD (gastroesophageal reflux disease)   . HTN (hypertension)   . Hx of echocardiogram    a. Echo 11/12:  Mod LVH, EF 55-60%, MAC, mod LAE, mild RAE  . Impotence of organic origin   . Inguinal hernia without mention of obstruction or gangrene, unilateral or unspecified, (not specified as recurrent)   . Insomnia, unspecified   . Intestinal infection due to Clostridium difficile   . Irritable bowel syndrome   . Kidney stone    "they just passed" (05/03/2012)  . Lumbago   . Nonspecific abnormal electrocardiogram (ECG) (EKG)   . Nonspecific abnormal results of pulmonary system function study   . Obesity, unspecified   . Osteoarthrosis, unspecified whether generalized or localized, unspecified site   . Other malaise and fatigue   . Other specified cardiac dysrhythmias(427.89)   . Pain in joint, ankle and foot   . Pain in joint, lower leg   . Pain in joint, pelvic region and thigh   . Pain in joint, shoulder region   . Partial deafness   .  Rash and other nonspecific skin eruption   . Sacroiliitis, not elsewhere classified ([place])   . Sacroiliitis, not elsewhere classified ([place])   . Spasm of muscle   . Special screening for malignant neoplasm of prostate   . Unspecified constipation   . Unspecified hereditary and idiopathic peripheral neuropathy     Past Surgical History:  Procedure Laterality Date  . APPENDECTOMY  ~ 1956  . ATRIAL FLUTTER ABLATION N/A 05/03/2012   Procedure: ATRIAL FLUTTER ABLATION;  Surgeon: Evans Lance, MD;  Location: [facility] CATH LAB;  Service: Cardiovascular;  Laterality: N/A;  . CARDIAC CATHETERIZATION  08/1999; 05/03/2012   normal coronary arteries;   . CARPAL TUNNEL WITH CUBITAL TUNNEL  2004   "right" (05/03/2012)  . COLONOSCOPY  06/06/2008   severe sigmoid diverticulosis and internal hemorrhoids  .  ESOPHAGOGASTRODUODENOSCOPY  06/06/2008   normal  . INGUINAL HERNIA REPAIR  03/23/11   "left" (05/03/2012)  . POSTERIOR LUMBAR FUSION  1989  . reconstruction (r) foot surgery     DR SUE   . RENAL CYST EXCISION     pt denies this hx on 05/03/2012  . [place]  . SPINE SURGERY  1969   Pantopaque Myelography and spinal infusion- Dr. Lyman Speller  . TONSILLECTOMY  ~ 1946  . TOTAL HIP ARTHROPLASTY  1999-2000   bilateral    Allergies  Allergen Reactions  . Amitriptyline   . Azithromycin     Stomach pain  . Flexeril [Cyclobenzaprine]     Causes dizziness      Medication List       Accurate as of 04/02/16 11:25 AM. Always use your most recent med list.          aspirin EC 81 MG tablet Take 1 tablet (81 mg total) by mouth daily.   hydrochlorothiazide 25 MG tablet Commonly known as:  HYDRODIURIL TAKE 1/2 TABLET BY MOUTH TWICE DAILY FOR BLOOD PRESSURE   losartan-hydrochlorothiazide 100-25 MG tablet Commonly known as:  HYZAAR TAKE ONE TABLET BY MOUTH ONCE DAILY TO CONTROL BLOOD PRESSURE   ondansetron 4 MG tablet Commonly known as:  ZOFRAN TAKE 1 TABLET BY MOUTH EVERY 8 HOURS AS NEEDED FOR NAUSEA   oxycodone 30 MG immediate release tablet Commonly known as:  ROXICODONE One every 3 hours if needed to control pain   predniSONE 5 MG tablet Commonly known as:  DELTASONE TAKE 2 TABLETS BY MOUTH EVERY MORNING TO HELP ARTHRITIS       Review of Systems:  Review of Systems  Constitutional: Negative for chills, fever, malaise/fatigue and weight loss.  Respiratory: Negative for shortness of breath.   Cardiovascular: Negative for chest pain and palpitations.  Gastrointestinal: Negative for abdominal pain.  Musculoskeletal: Positive for back pain, joint pain and myalgias. Negative for falls.  Skin: Negative for itching and rash.  Neurological: Positive for weakness. Negative for dizziness, loss of consciousness and headaches.  Psychiatric/Behavioral: Positive for  depression. Negative for memory loss. The patient is nervous/anxious and has insomnia.     Health Maintenance  Topic Date Due  . TETANUS/TDAP  06/01/2021  . INFLUENZA VACCINE  Completed  . ZOSTAVAX  Completed  . PNA vac Low Risk Adult  Completed    Physical Exam: Vitals:   04/02/16 1113  BP: 140/60  Pulse: 60  SpO2: 98%  Weight: 261 lb (118.4 kg)   Body mass index is 32.62 kg/m. Physical Exam  Constitutional: He is oriented to person, place, and time. He appears well-developed and well-nourished.  No distress.  Cardiovascular: Normal rate, regular rhythm, normal heart sounds and intact distal pulses.   Pulmonary/Chest: Effort normal and breath sounds normal. No respiratory distress.  Abdominal: Bowel sounds are normal.  Musculoskeletal: He exhibits no edema or deformity.  Tenderness on right lateral aspect of knee and pulling sensation when straightens right knee; no increase in pain with positional testing  Neurological: He is alert and oriented to person, place, and time.  Skin: Skin is warm and dry.  Psychiatric: He has a normal mood and affect.    Labs reviewed: Basic Metabolic Panel:  Recent Labs  11/21/15 1001 11/25/15 1550  NA 140  --   K 4.4  --   CL 100  --   CO2 24  --   GLUCOSE 90  --   BUN 21  --   CREATININE 1.12  --   CALCIUM 9.5  --   TSH  --  1.520   Liver Function Tests: No results for input(s): AST, ALT, ALKPHOS, BILITOT, PROT, ALBUMIN in the last 8760 hours. No results for input(s): LIPASE, AMYLASE in the last 8760 hours. No results for input(s): AMMONIA in the last 8760 hours. CBC: No results for input(s): WBC, NEUTROABS, HGB, HCT, MCV, PLT in the last 8760 hours. Lipid Panel: No results for input(s): CHOL, HDL, LDLCALC, TRIG, CHOLHDL, LDLDIRECT in the last 8760 hours. Lab Results  Component Value Date   HGBA1C (H) 08/10/2010    5.8 (NOTE)                                                                       According to the ADA  Clinical Practice Recommendations for 2011, when HbA1c is used as a screening test:   >=6.5%   Diagnostic of Diabetes Mellitus           (if abnormal result  is confirmed)  5.7-6.4%   Increased risk of developing Diabetes Mellitus  References:Diagnosis and Classification of Diabetes Mellitus,Diabetes [id number] 1):S62-S69 and Standards of Medical Care in         Diabetes - 2011,Diabetes Care,2011,34  (Suppl 1):S11-S61.   Assessment/Plan 1. 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 Cramps, muscle, general - will r/o electrolyte abnormality as cause first - Basic metabolic panel - Magnesium -also encouraged hydration with water  3. Screening for lipid disorders - Lipid panel at his request today -we discussed weight loss, diet and exercise -goal is to start off with 10 mins walking per day and increase up to 30 mins per day after he recovers from his knee injury  4. Obesity (BMI 30-39.9) - discussed his BMI, effects on labs, approaches to weight loss, given 2500 calorie diet as he is currently eating over 3500 calories per day by his report--discussed eating out less and cooking more at home - Lipid panel  Labs/tests ordered:  Orders Placed This Encounter  Procedures  . Basic metabolic panel    Order Specific Question:   Has the patient fasted?    Answer:   Yes  . Magnesium  . Lipid panel    Order Specific Question:   Has the patient fasted?    Answer:   Yes   Next appt:  05/18/2016  Minas Bonser L. Zahir Eisenhour, D.O. [facility] [address], [place] [address] Cell Phone (Mon-Fri  8am-5pm):  [phone] On Call:  [phone] & follow prompts after 5pm & weekends Office Phone:  [phone] Office Fax:  [phone]

## 2016-04-02 NOTE — Patient Instructions (Addendum)

## 2016-04-02 NOTE — Telephone Encounter (Signed)
Patient requested and will pick up 

## 2016-04-03 LAB — BASIC METABOLIC PANEL
BUN: 22 mg/dL (ref 7–25)
CO2: 24 mmol/L (ref 20–31)
Calcium: 9.8 mg/dL (ref 8.6–10.3)
Chloride: 105 mmol/L (ref 98–110)
Creat: 1.28 mg/dL — ABNORMAL HIGH (ref 0.70–1.18)
Glucose, Bld: 86 mg/dL (ref 65–99)
Potassium: 4.4 mmol/L (ref 3.5–5.3)
Sodium: 139 mmol/L (ref 135–146)

## 2016-04-03 LAB — LIPID PANEL
Cholesterol: 155 mg/dL (ref 125–200)
HDL: 49 mg/dL (ref >=40)
LDL Cholesterol: 90 mg/dL (ref <130)
Total CHOL/HDL Ratio: 3.2 Ratio (ref <=5.0)
Triglycerides: 80 mg/dL (ref <150)
VLDL: 16 mg/dL (ref <30)

## 2016-04-03 LAB — MAGNESIUM: Magnesium: 1.8 mg/dL (ref 1.5–2.5)

## 2016-04-05 ENCOUNTER — Encounter: Payer: Self-pay | Admitting: *Deleted

## 2016-04-14 ENCOUNTER — Ambulatory Visit (INDEPENDENT_AMBULATORY_CARE_PROVIDER_SITE_OTHER): Payer: Medicare Other | Admitting: Orthopedic Surgery

## 2016-04-16 ENCOUNTER — Encounter: Payer: Self-pay | Admitting: Internal Medicine

## 2016-04-16 ENCOUNTER — Ambulatory Visit (INDEPENDENT_AMBULATORY_CARE_PROVIDER_SITE_OTHER): Payer: Medicare Other | Admitting: Internal Medicine

## 2016-04-16 VITALS — BP 160/80 | HR 64 | Temp 98.2°F | Wt 264.0 lb

## 2016-04-16 DIAGNOSIS — S83429D Sprain of lateral collateral ligament of unspecified knee, subsequent encounter: Secondary | ICD-10-CM | POA: Diagnosis not present

## 2016-04-16 DIAGNOSIS — I10 Essential (primary) hypertension: Secondary | ICD-10-CM | POA: Diagnosis not present

## 2016-04-16 DIAGNOSIS — G5701 Lesion of sciatic nerve, right lower limb: Secondary | ICD-10-CM

## 2016-04-16 MED ORDER — METHOCARBAMOL 500 MG PO TABS: 500.0000 mg | ORAL_TABLET | Freq: Every evening | ORAL | 0 refills | Status: DC | PRN

## 2016-04-16 NOTE — Progress Notes (Signed)
Location:  [facility] clinic Provider: Mindel Friscia L. Mariea Clonts, D.O., C.M.D.  Code Status:DNR Goals of Care:  Advanced Directives 01/27/2016  Does patient have an advance directive? Yes  Type of Paramedic of [place];Living will  Does patient want to make changes to advanced directive? -  Copy of advanced directive(s) in chart? No - copy requested  Would patient like information on creating an advanced directive? -     Chief Complaint  Patient presents with  . Acute Visit    right knee pain x 4weeks    HPI: Patient is a 75 y.o. male seen today for an acute visit for residual right knee pain.  Now it's going down into the outside of his right foot and tingles.  Right hip is also incredibly sore.  Did have bilateral hip replacements 19 years ago and didn't have trouble with them since.  Pain still on the outside of the knee and now the knee pops.  Has been climbing stairs using the left first.  He switched to heat and that did not help--made it worse.  Back to ice and occasional jack daniels.  When he stands to start walking, the knee and hp don't want to go, but gets better as he goes.  If he has to turn the right hip to get out of the car, it's very painful.    He is forgetting if he's taking his bp medications.  He urinates frequently during the day, but only up once at night.  He does not think he fully empties his bladder.  He was taking a pill for that at one point.  Will urinate some, start to leave, then realizes he's not done.  He'll end up waiting and waiting.  Then may have some dribbling.  Reports stress is why his bp is up.    Feels melancholy in the evenings and thru the night.  Feels better in mornings.  Says we can discuss that at his next appointment.  Past Medical History:  Diagnosis Date  . Allergic rhinitis due to pollen   . Allergy   . Anxiety state, unspecified   . Arthritis    "right shoulder" (05/03/2012)  . Atrial flutter ([place])    a. dx after  inguinal hernia repair in 10/12 => seen by Dr. Acie Fredrickson  . Calculus of kidney   . Cervicalgia   . Cervicalgia   . Clostridium difficile colitis   . Complications affecting other specified body systems, hypertension   . Coronary atherosclerosis of unspecified type of vessel, native or graft   . Degeneration of cervical intervertebral disc   . Depression   . Depressive disorder, not elsewhere classified   . GERD (gastroesophageal reflux disease)   . HTN (hypertension)   . Hx of echocardiogram    a. Echo 11/12:  Mod LVH, EF 55-60%, MAC, mod LAE, mild RAE  . Impotence of organic origin   . Inguinal hernia without mention of obstruction or gangrene, unilateral or unspecified, (not specified as recurrent)   . Insomnia, unspecified   . Intestinal infection due to Clostridium difficile   . Irritable bowel syndrome   . Kidney stone    "they just passed" (05/03/2012)  . Lumbago   . Nonspecific abnormal electrocardiogram (ECG) (EKG)   . Nonspecific abnormal results of pulmonary system function study   . Obesity, unspecified   . Osteoarthrosis, unspecified whether generalized or localized, unspecified site   . Other malaise and fatigue   . Other specified cardiac  dysrhythmias(427.89)   . Pain in joint, ankle and foot   . Pain in joint, lower leg   . Pain in joint, pelvic region and thigh   . Pain in joint, shoulder region   . Partial deafness   . Rash and other nonspecific skin eruption   . Sacroiliitis, not elsewhere classified ([place])   . Sacroiliitis, not elsewhere classified ([place])   . Spasm of muscle   . Special screening for malignant neoplasm of prostate   . Unspecified constipation   . Unspecified hereditary and idiopathic peripheral neuropathy     Past Surgical History:  Procedure Laterality Date  . APPENDECTOMY  ~ 1956  . ATRIAL FLUTTER ABLATION N/A 05/03/2012   Procedure: ATRIAL FLUTTER ABLATION;  Surgeon: Evans Lance, MD;  Location: [facility] CATH LAB;  Service: Cardiovascular;   Laterality: N/A;  . CARDIAC CATHETERIZATION  08/1999; 05/03/2012   normal coronary arteries;   . CARPAL TUNNEL WITH CUBITAL TUNNEL  2004   "right" (05/03/2012)  . COLONOSCOPY  06/06/2008   severe sigmoid diverticulosis and internal hemorrhoids  . ESOPHAGOGASTRODUODENOSCOPY  06/06/2008   normal  . INGUINAL HERNIA REPAIR  03/23/11   "left" (05/03/2012)  . POSTERIOR LUMBAR FUSION  1989  . reconstruction (r) foot surgery     DR SUE   . RENAL CYST EXCISION     pt denies this hx on 05/03/2012  . [place]  . SPINE SURGERY  1969   Pantopaque Myelography and spinal infusion- Dr. Lyman Speller  . TONSILLECTOMY  ~ 1946  . TOTAL HIP ARTHROPLASTY  1999-2000   bilateral    Allergies  Allergen Reactions  . Amitriptyline   . Azithromycin     Stomach pain  . Flexeril [Cyclobenzaprine]     Causes dizziness      Medication List       Accurate as of 04/16/16 11:30 AM. Always use your most recent med list.          aspirin EC 81 MG tablet Take 1 tablet (81 mg total) by mouth daily.   hydrochlorothiazide 25 MG tablet Commonly known as:  HYDRODIURIL TAKE 1/2 TABLET BY MOUTH TWICE DAILY FOR BLOOD PRESSURE   losartan-hydrochlorothiazide 100-25 MG tablet Commonly known as:  HYZAAR TAKE ONE TABLET BY MOUTH ONCE DAILY TO CONTROL BLOOD PRESSURE   ondansetron 4 MG tablet Commonly known as:  ZOFRAN TAKE 1 TABLET BY MOUTH EVERY 8 HOURS AS NEEDED FOR NAUSEA   oxycodone 30 MG immediate release tablet Commonly known as:  ROXICODONE One every 3 hours if needed to control pain   predniSONE 5 MG tablet Commonly known as:  DELTASONE TAKE 2 TABLETS BY MOUTH EVERY MORNING TO HELP ARTHRITIS       Review of Systems:  Review of Systems  Constitutional: Positive for malaise/fatigue. Negative for chills and fever.  HENT: Positive for hearing loss. Negative for congestion.   Eyes: Negative for blurred vision.  Respiratory: Negative for shortness of breath.   Cardiovascular: Negative for chest  pain, palpitations and leg swelling.  Gastrointestinal: Negative for abdominal pain, blood in stool, constipation and melena.  Genitourinary: Positive for frequency. Negative for dysuria and urgency.  Musculoskeletal: Positive for back pain, joint pain and myalgias. Negative for falls.  Skin: Negative for itching and rash.  Neurological: Negative for dizziness, loss of consciousness and weakness.  Psychiatric/Behavioral: Positive for depression and memory loss. The patient is nervous/anxious.     Health Maintenance  Topic Date Due  . TETANUS/TDAP  06/01/2021  . INFLUENZA VACCINE  Completed  . ZOSTAVAX  Completed  . PNA vac Low Risk Adult  Completed    Physical Exam: Vitals:   04/16/16 1121  BP: (!) 160/80  Pulse: 64  Temp: 98.2 F (36.8 C)  TempSrc: Oral  SpO2: 94%  Weight: 264 lb (119.7 kg)   Body mass index is 33 kg/m. Physical Exam  Constitutional: He is oriented to person, place, and time. He appears well-developed and well-nourished. No distress.  Cardiovascular: Normal rate, regular rhythm, normal heart sounds and intact distal pulses.   Pulmonary/Chest: Effort normal and breath sounds normal. No respiratory distress.  Musculoskeletal: Normal range of motion. He exhibits tenderness.  Tenderness right lateral knee and right buttock beneath SI region, more in muscle itself; as he was sitting, observed him leaning forward and getting jolted by pain or not even moving and suddenly having a jolt of pain down his leg; no foot drop  Neurological: He is alert and oriented to person, place, and time.    Labs reviewed: Basic Metabolic Panel:  Recent Labs  11/21/15 1001 11/25/15 1550 04/02/16 1208  NA 140  --  139  K 4.4  --  4.4  CL 100  --  105  CO2 24  --  24  GLUCOSE 90  --  86  BUN 21  --  22  CREATININE 1.12  --  1.28*  CALCIUM 9.5  --  9.8  MG  --   --  1.8  TSH  --  1.520  --    Liver Function Tests: No results for input(s): AST, ALT, ALKPHOS, BILITOT,  PROT, ALBUMIN in the last 8760 hours. No results for input(s): LIPASE, AMYLASE in the last 8760 hours. No results for input(s): AMMONIA in the last 8760 hours. CBC: No results for input(s): WBC, NEUTROABS, HGB, HCT, MCV, PLT in the last 8760 hours. Lipid Panel:  Recent Labs  04/02/16 1208  CHOL 155  HDL 49  LDLCALC 90  TRIG 80  CHOLHDL 3.2   Lab Results  Component Value Date   HGBA1C (H) 08/10/2010    5.8 (NOTE)                                                                       According to the ADA Clinical Practice Recommendations for 2011, when HbA1c is used as a screening test:   >=6.5%   Diagnostic of Diabetes Mellitus           (if abnormal result  is confirmed)  5.7-6.4%   Increased risk of developing Diabetes Mellitus  References:Diagnosis and Classification of Diabetes Mellitus,Diabetes [id number] 1):S62-S69 and Standards of Medical Care in         Diabetes - 2011,Diabetes [id number]  (Suppl 1):S11-S61.    Assessment/Plan 1. Piriformis syndrome, right -seems he has now developed a piriformin syndrome from his abnormal gait since hurting his lateral knee - seems more like muscle spasms compressing the nerve--not an chronic sciatica (intermittent) - Ambulatory referral to Physical Therapy--does not want [place] location--needs one session to learn exercises and then wants to do them himself (cannot afford multiple copays) - methocarbamol (ROBAXIN) 500 MG tablet; Take 1 tablet (500 mg total) by mouth at bedtime as needed  for muscle spasms.  Dispense: 30 tablet; Refill: 0--only at night to avoid sedation and interaction with his narcotic pain medication  2. Sprain of lateral collateral ligament of knee, subsequent encounter - is gradually getting better, he reports and he wanted to hold off on MRI at this point -he's more bothered now by the neuropathic pain coming down his leg from his buttocks - Ambulatory referral to Physical Therapy  3. Essential  hypertension -bp was not controlled today, pt is in pain also, but is not sure if he took his medication -also is having a lot of urinary frequency due to taking three diuretics per day (arb/hctz combo daily AND  hctz 1/2 tab bid)--he reports never having an edema problem -this could also be why he was having muscle cramps -advised to d/c the hctz alone and cont the combo pill daily -keep visit for annual next month with Dr. Nyoka Cowden to readdress the bp issue if bps still running high then  Labs/tests ordered:   Orders Placed This Encounter  Procedures  . Ambulatory referral to Physical Therapy    Referral Priority:   Routine    Referral Type:   Physical Medicine    Referral Reason:   Specialty Services Required    Requested Specialty:   Physical Therapy    Number of Visits Requested:   1    Next appt:  05/18/2016  Jashaun Penrose L. Samarion Ehle, D.O. [facility] [address], [place] [address] Cell Phone (Mon-Fri 8am-5pm):  [phone] On Call:  [phone] & follow prompts after 5pm & weekends Office Phone:  [phone] Office Fax:  [phone]

## 2016-04-28 ENCOUNTER — Other Ambulatory Visit: Payer: Self-pay | Admitting: Internal Medicine

## 2016-04-28 NOTE — Telephone Encounter (Signed)
Spoke with patient he stated that he has gotten some bug bites and would like kenalog cream. Please advise

## 2016-04-30 ENCOUNTER — Other Ambulatory Visit: Payer: Self-pay | Admitting: *Deleted

## 2016-04-30 DIAGNOSIS — M12811 Other specific arthropathies, not elsewhere classified, right shoulder: Secondary | ICD-10-CM

## 2016-04-30 DIAGNOSIS — M75101 Unspecified rotator cuff tear or rupture of right shoulder, not specified as traumatic: Principal | ICD-10-CM

## 2016-04-30 MED ORDER — OXYCODONE HCL 30 MG PO TABS: ORAL_TABLET | ORAL | 0 refills | Status: DC

## 2016-04-30 MED ORDER — TRIAMCINOLONE ACETONIDE 0.1 % EX CREA: 1.0000 "application " | TOPICAL_CREAM | Freq: Two times a day (BID) | CUTANEOUS | 0 refills | Status: DC

## 2016-04-30 NOTE — Telephone Encounter (Signed)
Patient came in today to pick up other Rx's. Asked patient if he still needed the medication. He said it was getting better, just hold off at this time.

## 2016-04-30 NOTE — Telephone Encounter (Signed)
Patient requested and will pick up 

## 2016-04-30 NOTE — Telephone Encounter (Signed)
[facility]  

## 2016-05-10 ENCOUNTER — Ambulatory Visit (INDEPENDENT_AMBULATORY_CARE_PROVIDER_SITE_OTHER): Payer: Medicare Other | Admitting: Nurse Practitioner

## 2016-05-10 VITALS — BP 168/96 | HR 72 | Temp 97.5°F | Ht 75.0 in | Wt 264.6 lb

## 2016-05-10 DIAGNOSIS — R21 Rash and other nonspecific skin eruption: Secondary | ICD-10-CM

## 2016-05-10 MED ORDER — METHYLPREDNISOLONE ACETATE 40 MG/ML IJ SUSP
40.0000 mg | Freq: Once | INTRAMUSCULAR | Status: AC
  Administered 2016-05-10: 40 mg via INTRAMUSCULAR

## 2016-05-10 NOTE — Patient Instructions (Addendum)
Ranitidine 150 mg twice a day for 1 week.  You have received a steroid injection today.   Benadryl cream 3-4 times/day as needed for itching.        Contact Dermatitis Introduction Dermatitis is redness, soreness, and swelling (inflammation) of the skin. Contact dermatitis is a reaction to certain substances that touch the skin. You either touched something that irritated your skin, or you have allergies to something you touched. Follow these instructions at home: [place] your skin as needed.  Apply cool compresses to the affected areas.  Try taking a bath with:  Epsom salts. Follow the instructions on the package. You can get these at a pharmacy or grocery store.  Baking soda. Pour a small amount into the bath as told by your doctor.  Colloidal oatmeal. Follow the instructions on the package. You can get this at a pharmacy or grocery store.  Try applying baking soda paste to your skin. Stir water into baking soda until it looks like paste.  Do not scratch your skin.  Bathe less often.  Bathe in lukewarm water. Avoid using hot water. Medicines  Take or apply over-the-counter and prescription medicines only as told by your doctor.  If you were prescribed an antibiotic medicine, take or apply your antibiotic as told by your doctor. Do not stop taking the antibiotic even if your condition starts to get better. General instructions  Keep all follow-up visits as told by your doctor. This is important.  Avoid the substance that caused your reaction. If you do not know what caused it, keep a journal to try to track what caused it. Write down:  What you eat.  What cosmetic products you use.  What you drink.  What you wear in the affected area. This includes jewelry.  If you were given a bandage (dressing), take care of it as told by your doctor. This includes when to change and remove it. Contact a doctor if:  You do not get better with  treatment.  Your condition gets worse.  You have signs of infection such as:  Swelling.  Tenderness.  Redness.  Soreness.  Warmth.  You have a fever.  You have new symptoms. Get help right away if:  You have a very bad headache.  You have neck pain.  Your neck is stiff.  You throw up (vomit).  You feel very sleepy.  You see red streaks coming from the affected area.  Your bone or joint underneath the affected area becomes painful after the skin has healed.  The affected area turns darker.  You have trouble breathing. This information is not intended to replace advice given to you by your health care provider. Make sure you discuss any questions you have with your health care provider. Document Released: 03/14/2009 Document Revised: 10/23/2015 Document Reviewed: 10/02/2014  2017 [employer]

## 2016-05-10 NOTE — Progress Notes (Signed)
Careteam: Patient Care Team: Estill Dooms, MD as PCP - General (Internal Medicine)   Allergies  Allergen Reactions  . Amitriptyline   . Azithromycin     Stomach pain  . Flexeril [Cyclobenzaprine]     Causes dizziness    Chief Complaint  Patient presents with  . Acute Visit    Sore, Itchy Rash on Bottom. Has Tried with no Relief Aloe Gel with Lidocaine, Gold Bond with Lidocaine, Hydrocortisone Cream and Kenalog Cream     HPI: Patient is a 75 y.o. male seen in the office today for a sore, itchy rash on his bottom that presented 4 days ago.  No recent antibiotics. No diarrhea. Has not spread. No new detergent. No new underwear. Has not been sitting anywhere with his bare bottom. Started using Dove body wash 2 weeks ago.  Treatments tried with no relief include aloe gel with lidocaine, gold bond with lidocaine, hydrocortisone cream, and kenalog cream. Reports these treatments caused even more irritation. Used ice packs to calm it down last night it got so bad.   Review of Systems:  Review of Systems  Constitutional: Negative for chills and fever.  Respiratory: Negative for shortness of breath and wheezing.   Cardiovascular: Negative for leg swelling.  Gastrointestinal: Negative for constipation, diarrhea, nausea and vomiting.  Skin: Positive for rash (buttocks).    Past Medical History:  Diagnosis Date  . Allergic rhinitis due to pollen   . Allergy   . Anxiety state, unspecified   . Arthritis    "right shoulder" (05/03/2012)  . Atrial flutter ([place])    a. dx after inguinal hernia repair in 10/12 => seen by Dr. Acie Fredrickson  . Calculus of kidney   . Cervicalgia   . Cervicalgia   . Clostridium difficile colitis   . Complications affecting other specified body systems, hypertension   . Coronary atherosclerosis of unspecified type of vessel, native or graft   . Degeneration of cervical intervertebral disc   . Depression   . Depressive disorder, not elsewhere classified   .  GERD (gastroesophageal reflux disease)   . HTN (hypertension)   . Hx of echocardiogram    a. Echo 11/12:  Mod LVH, EF 55-60%, MAC, mod LAE, mild RAE  . Impotence of organic origin   . Inguinal hernia without mention of obstruction or gangrene, unilateral or unspecified, (not specified as recurrent)   . Insomnia, unspecified   . Intestinal infection due to Clostridium difficile   . Irritable bowel syndrome   . Kidney stone    "they just passed" (05/03/2012)  . Lumbago   . Nonspecific abnormal electrocardiogram (ECG) (EKG)   . Nonspecific abnormal results of pulmonary system function study   . Obesity, unspecified   . Osteoarthrosis, unspecified whether generalized or localized, unspecified site   . Other malaise and fatigue   . Other specified cardiac dysrhythmias(427.89)   . Pain in joint, ankle and foot   . Pain in joint, lower leg   . Pain in joint, pelvic region and thigh   . Pain in joint, shoulder region   . Partial deafness   . Rash and other nonspecific skin eruption   . Sacroiliitis, not elsewhere classified ([place])   . Sacroiliitis, not elsewhere classified ([place])   . Spasm of muscle   . Special screening for malignant neoplasm of prostate   . Unspecified constipation   . Unspecified hereditary and idiopathic peripheral neuropathy    Past Surgical History:  Procedure Laterality Date  .  APPENDECTOMY  ~ 1956  . ATRIAL FLUTTER ABLATION N/A 05/03/2012   Procedure: ATRIAL FLUTTER ABLATION;  Surgeon: Evans Lance, MD;  Location: [facility] CATH LAB;  Service: Cardiovascular;  Laterality: N/A;  . CARDIAC CATHETERIZATION  08/1999; 05/03/2012   normal coronary arteries;   . CARPAL TUNNEL WITH CUBITAL TUNNEL  2004   "right" (05/03/2012)  . COLONOSCOPY  06/06/2008   severe sigmoid diverticulosis and internal hemorrhoids  . ESOPHAGOGASTRODUODENOSCOPY  06/06/2008   normal  . INGUINAL HERNIA REPAIR  03/23/11   "left" (05/03/2012)  . POSTERIOR LUMBAR FUSION  1989  . reconstruction (r) foot  surgery     DR SUE   . RENAL CYST EXCISION     pt denies this hx on 05/03/2012  . [place]  . SPINE SURGERY  1969   Pantopaque Myelography and spinal infusion- Dr. Lyman Speller  . TONSILLECTOMY  ~ 1946  . TOTAL HIP ARTHROPLASTY  1999-2000   bilateral   Social History:   reports that he has quit smoking. His smoking use included Cigarettes. He has a 10.00 pack-year smoking history. He has never used smokeless tobacco. He reports that he drinks alcohol. He reports that he does not use drugs.  Family History  Problem Relation Age of Onset  . Hypertension Father   . Colon cancer Neg Hx     Medications: Patient's Medications  New Prescriptions   No medications on file  Previous Medications   ASPIRIN EC 81 MG TABLET    Take 1 tablet (81 mg total) by mouth daily.   LOSARTAN-HYDROCHLOROTHIAZIDE (HYZAAR) 100-25 MG TABLET    TAKE ONE TABLET BY MOUTH ONCE DAILY TO CONTROL BLOOD PRESSURE   METHOCARBAMOL (ROBAXIN) 500 MG TABLET    Take 1 tablet (500 mg total) by mouth at bedtime as needed for muscle spasms.   ONDANSETRON (ZOFRAN) 4 MG TABLET    TAKE 1 TABLET BY MOUTH EVERY 8 HOURS AS NEEDED FOR NAUSEA   OXYCODONE (ROXICODONE) 30 MG IMMEDIATE RELEASE TABLET    One every 3 hours if needed to control pain   TRIAMCINOLONE CREAM (KENALOG) 0.1 %    Apply 1 application topically 2 (two) times daily.  Modified Medications   No medications on file  Discontinued Medications   No medications on file     Physical Exam:  Vitals:   05/10/16 1242  BP: (!) 168/96  Pulse: 72  Temp: 97.5 F (36.4 C)  TempSrc: Oral  Weight: 264 lb 9.6 oz (120 kg)  Height: 6\' 3"  (1.905 m)   Body mass index is 33.07 kg/m.  Physical Exam  Constitutional: He is oriented to person, place, and time. He appears well-developed and well-nourished.  HENT:  Head: Normocephalic.  Mouth/Throat: Oropharynx is clear and moist. No oropharyngeal exudate.  Eyes: Pupils are equal, round, and reactive to light.  Neck:  Normal range of motion.  Cardiovascular: Normal rate, regular rhythm and normal heart sounds.   Pulmonary/Chest: Effort normal and breath sounds normal.  Abdominal: Soft. Bowel sounds are normal.  Neurological: He is alert and oriented to person, place, and time.  Skin: Skin is warm and dry. Rash (covering bilateral buttocks without spreading, warm to the touch) noted. Rash is urticarial. There is erythema.  Psychiatric: He has a normal mood and affect. His behavior is normal. Judgment and thought content normal.    Labs reviewed: Basic Metabolic Panel:  Recent Labs  11/21/15 1001 11/25/15 1550 04/02/16 1208  NA 140  --  139  K 4.4  --  4.4  CL 100  --  105  CO2 24  --  24  GLUCOSE 90  --  86  BUN 21  --  22  CREATININE 1.12  --  1.28*  CALCIUM 9.5  --  9.8  MG  --   --  1.8  TSH  --  1.520  --    Liver Function Tests: No results for input(s): AST, ALT, ALKPHOS, BILITOT, PROT, ALBUMIN in the last 8760 hours. No results for input(s): LIPASE, AMYLASE in the last 8760 hours. No results for input(s): AMMONIA in the last 8760 hours. CBC: No results for input(s): WBC, NEUTROABS, HGB, HCT, MCV, PLT in the last 8760 hours. Lipid Panel:  Recent Labs  04/02/16 1208  CHOL 155  HDL 49  LDLCALC 90  TRIG 80  CHOLHDL 3.2   TSH:  Recent Labs  11/25/15 1550  TSH 1.520   A1C: Lab Results  Component Value Date   HGBA1C (H) 08/10/2010    5.8 (NOTE)                                                                       According to the ADA Clinical Practice Recommendations for 2011, when HbA1c is used as a screening test:   >=6.5%   Diagnostic of Diabetes Mellitus           (if abnormal result  is confirmed)  5.7-6.4%   Increased risk of developing Diabetes Mellitus  References:Diagnosis and Classification of Diabetes Mellitus,Diabetes [id number] 1):S62-S69 and Standards of Medical Care in         Diabetes - 2011,Diabetes [id number]  (Suppl 1):S11-S61.      Assessment/Plan 1. Rash - Large urticarial plaque to entire buttocks without spreading with unknown cause.  - Ranitidine 150 mg BID for 1 week. - Benadryl cream 3-4 times/day as needed for itching. - Aveeno oatmeal baths to sooth itching. - Education provided on contact dermatitis. - methylPREDNISolone acetate (DEPO-MEDROL) injection 40 mg; Inject 1 mL (40 mg total) into the muscle once.  To follow up if symptoms worsening or do not improve  [place] K. Harle Battiest  [facility] [phone] 8 am - 5 pm) [phone] (after hours)

## 2016-05-11 ENCOUNTER — Telehealth: Payer: Self-pay | Admitting: *Deleted

## 2016-05-11 ENCOUNTER — Telehealth: Payer: Self-pay | Admitting: Physical Therapy

## 2016-05-11 NOTE — Telephone Encounter (Signed)
04/19/16 & 04/26/16 left message to call, 04/26/16 patient returned call and said he would schedule later

## 2016-05-11 NOTE — Telephone Encounter (Signed)
Patient called and stated that he only wants me to send this to you. Stated that he saw Janett Billow yesterday for a rash on his bottom and it is no better, itchy and painful. And she told him that if it was no better by Wednesday to follow up but there are no available appointments. Patient wants your advice. Would like to see you. Please Advise.

## 2016-05-12 ENCOUNTER — Ambulatory Visit (INDEPENDENT_AMBULATORY_CARE_PROVIDER_SITE_OTHER): Payer: Medicare Other | Admitting: Internal Medicine

## 2016-05-12 ENCOUNTER — Encounter: Payer: Self-pay | Admitting: Internal Medicine

## 2016-05-12 VITALS — BP 152/88 | HR 68 | Temp 97.5°F

## 2016-05-12 DIAGNOSIS — R21 Rash and other nonspecific skin eruption: Secondary | ICD-10-CM | POA: Diagnosis not present

## 2016-05-12 DIAGNOSIS — S93422A Sprain of deltoid ligament of left ankle, initial encounter: Secondary | ICD-10-CM

## 2016-05-12 DIAGNOSIS — S93409A Sprain of unspecified ligament of unspecified ankle, initial encounter: Secondary | ICD-10-CM | POA: Insufficient documentation

## 2016-05-12 MED ORDER — TRIAMCINOLONE ACETONIDE 0.1 % EX CREA: TOPICAL_CREAM | CUTANEOUS | 4 refills | Status: DC

## 2016-05-12 NOTE — Telephone Encounter (Signed)
Advise him to come now for OV.

## 2016-05-12 NOTE — Progress Notes (Signed)
Facility  [place]    Place of Service:   OFFICE    Allergies  Allergen Reactions  . Amitriptyline   . Azithromycin     Stomach pain  . Flexeril [Cyclobenzaprine]     Causes dizziness    Chief Complaint  Patient presents with  . Acute Visit    rash on bottom not any better. [place] out of tub 05/10/16, hurt left leg, left ankle swollen.     HPI:  Rash and nonspecific skin eruption - extensive erythematous rash across both buttocks. loos like either an allergic reaction or possibly chemicsal trauma. He cannot recall sitting in anything.  Sprain of deltoid ligament of left ankle, initial encounter - fell while sitting in a tub to soak his buttock rash in oilated oatmeal.. Twisted left ankle. Now swollen and painful.    Medications: Patient's Medications  New Prescriptions   No medications on file  Previous Medications   ASPIRIN EC 81 MG TABLET    Take 1 tablet (81 mg total) by mouth daily.   LOSARTAN-HYDROCHLOROTHIAZIDE (HYZAAR) 100-25 MG TABLET    TAKE ONE TABLET BY MOUTH ONCE DAILY TO CONTROL BLOOD PRESSURE   METHOCARBAMOL (ROBAXIN) 500 MG TABLET    Take 1 tablet (500 mg total) by mouth at bedtime as needed for muscle spasms.   ONDANSETRON (ZOFRAN) 4 MG TABLET    TAKE 1 TABLET BY MOUTH EVERY 8 HOURS AS NEEDED FOR NAUSEA   OXYCODONE (ROXICODONE) 30 MG IMMEDIATE RELEASE TABLET    One every 3 hours if needed to control pain   TRIAMCINOLONE CREAM (KENALOG) 0.1 %    Apply 1 application topically 2 (two) times daily.  Modified Medications   No medications on file  Discontinued Medications   No medications on file    Review of Systems  Constitutional: Positive for appetite change and fatigue. Negative for chills and fever.  HENT: Positive for hearing loss. Negative for ear pain, sinus pressure, trouble swallowing and voice change.   Eyes: Negative.   Cardiovascular:       Hx hypertension.   Gastrointestinal: Negative for abdominal distention, blood in stool, constipation and  diarrhea.  Endocrine: Negative.   Genitourinary: Negative.   Musculoskeletal: Positive for joint swelling (left ankle laterally).       Increased shoulder pains. Painful to raise or rotate shoulders.  Skin: Positive for rash.       Rash on buttocks.  Painless atrophic plaque of the left forearm.  Neurological: Negative.   Hematological: Negative.   Psychiatric/Behavioral: Positive for dysphoric mood and sleep disturbance (Insomnia). Negative for self-injury and suicidal ideas. The patient is nervous/anxious.     Vitals:   05/12/16 1632  BP: (!) 152/88  Pulse: 68  Temp: 97.5 F (36.4 C)  TempSrc: Oral  SpO2: 96%   There is no height or weight on file to calculate BMI. Wt Readings from Last 3 Encounters:  05/10/16 264 lb 9.6 oz (120 kg)  04/16/16 264 lb (119.7 kg)  04/02/16 261 lb (118.4 kg)      Physical Exam  Constitutional: He is oriented to person, place, and time. He appears well-developed and well-nourished. No distress.  Moderately overweight  HENT:  Head: Normocephalic and atraumatic.  Significant partial deafness bilaterally.  Eyes: Conjunctivae and EOM are normal. Pupils are equal, round, and reactive to light.  Neck: Normal range of motion. Neck supple. No JVD present. No tracheal deviation present. No thyromegaly present.  Cardiovascular: Normal rate, regular rhythm, normal heart sounds  and intact distal pulses.  Exam reveals no gallop and no friction rub.   No murmur heard. Pulmonary/Chest: Effort normal and breath sounds normal. No respiratory distress. He has no wheezes. He has no rales. He exhibits no tenderness.  Abdominal: Soft. Bowel sounds are normal. He exhibits no distension and no mass. There is no tenderness.  Genitourinary: Rectum normal, prostate normal and penis normal. Rectal exam shows guaiac negative stool.  Musculoskeletal: He exhibits no edema or tenderness.  Left lateral ankle and foot are tender and swollen. Hammer toe right 3rd. Painful  shoulders with elevation or rotation. Pain in both knees. Pushes off to get out of a chair. Unable to fully squat and come back up.  Lymphadenopathy:    He has no cervical adenopathy.  Neurological: He is alert and oriented to person, place, and time. No cranial nerve deficit.  Skin: Rash (extensive erythematous rash covering both buttocks) noted. There is erythema. No pallor.  Atrophic plaque of the left forearm. Healed laceration of the left arm   Psychiatric: His speech is normal and behavior is normal. Judgment and thought content normal. Cognition and memory are normal. He exhibits a depressed mood.  Anxiety and depressed mood.    Labs reviewed: Lab Summary Latest Ref Rng & Units 04/02/2016 11/21/2015  Hemoglobin - (None) (None)  Hematocrit - (None) (None)  White count - (None) (None)  Platelet count - (None) (None)  Sodium 135 - 146 mmol/L 139 140  Potassium 3.5 - 5.3 mmol/L 4.4 4.4  Calcium 8.6 - 10.3 mg/dL 9.8 9.5  Phosphorus - (None) (None)  Creatinine 0.70 - 1.18 mg/dL 1.28(H) 1.12  AST - (None) (None)  Alk Phos - (None) (None)  Bilirubin - (None) (None)  Glucose 65 - 99 mg/dL 86 90  Cholesterol 125 - 200 mg/dL 155 (None)  HDL cholesterol >=40 mg/dL 49 (None)  Triglycerides <150 mg/dL 80 (None)  LDL Direct - (None) (None)  LDL Calc <130 mg/dL 90 (None)  Total protein - (None) (None)  Albumin - (None) (None)  Some recent data might be hidden   Lab Results  Component Value Date   TSH 1.520 11/25/2015   TSH 1.57 03/22/2012   TSH 1.219 03/23/2011   Lab Results  Component Value Date   BUN 22 04/02/2016   BUN 21 11/21/2015   BUN 22 02/24/2015   Lab Results  Component Value Date   HGBA1C (H) 08/10/2010    5.8 (NOTE)                                                                       According to the ADA Clinical Practice Recommendations for 2011, when HbA1c is used as a screening test:   >=6.5%   Diagnostic of Diabetes Mellitus           (if abnormal result   is confirmed)  5.7-6.4%   Increased risk of developing Diabetes Mellitus  References:Diagnosis and Classification of Diabetes Mellitus,Diabetes WGYK,5993,57(SVXBL 1):S62-S69 and Standards of Medical Care in         Diabetes - 2011,Diabetes Care,2011,34  (Suppl 1):S11-S61.    Assessment/Plan  1. Sprain of deltoid ligament of left ankle, initial encounter Wear a boot above the left ankle or use a ankle compression  wrap - DG Ankle Complete Left; Future  2. Rash and nonspecific skin eruption - triamcinolone cream (KENALOG) 0.1 %; Apply to rash on buttocks twice daily  Dispense: 30 g; Refill: 4

## 2016-05-12 NOTE — Telephone Encounter (Signed)
Patient wife is calling today requesting advice. Please Advise.

## 2016-05-12 NOTE — Telephone Encounter (Signed)
Patient notified and on the way. Appointment added to schedule.

## 2016-05-13 ENCOUNTER — Ambulatory Visit
Admission: RE | Admit: 2016-05-13 | Discharge: 2016-05-13 | Disposition: A | Discharge status: Home / Self Care | Payer: Medicare Other | Source: Ambulatory Visit | Attending: Internal Medicine | Admitting: Internal Medicine

## 2016-05-13 DIAGNOSIS — M25572 Pain in left ankle and joints of left foot: Secondary | ICD-10-CM | POA: Diagnosis not present

## 2016-05-13 DIAGNOSIS — M7989 Other specified soft tissue disorders: Secondary | ICD-10-CM | POA: Diagnosis not present

## 2016-05-13 DIAGNOSIS — S93422A Sprain of deltoid ligament of left ankle, initial encounter: Secondary | ICD-10-CM

## 2016-05-17 ENCOUNTER — Telehealth: Payer: Self-pay | Admitting: *Deleted

## 2016-05-17 DIAGNOSIS — R21 Rash and other nonspecific skin eruption: Secondary | ICD-10-CM

## 2016-05-17 NOTE — Telephone Encounter (Signed)
Patient called and stated that his rash is no better and spreading. Patient is using the cream. Wants to know if he should go to a Dermatologist. Please Advise.

## 2016-05-17 NOTE — Telephone Encounter (Signed)
Please get him an appointment with a dermatologist ASAP.

## 2016-05-17 NOTE — Telephone Encounter (Signed)
Order placed and patient notified 

## 2016-05-18 ENCOUNTER — Emergency Department (HOSPITAL_COMMUNITY)
Admission: EM | Admit: 2016-05-18 | Discharge: 2016-05-18 | Disposition: A | Discharge status: Home / Self Care | Payer: Medicare Other | Attending: Emergency Medicine | Admitting: Emergency Medicine

## 2016-05-18 ENCOUNTER — Encounter (HOSPITAL_COMMUNITY): Payer: Self-pay

## 2016-05-18 ENCOUNTER — Encounter: Payer: Self-pay | Admitting: Internal Medicine

## 2016-05-18 ENCOUNTER — Ambulatory Visit (INDEPENDENT_AMBULATORY_CARE_PROVIDER_SITE_OTHER): Payer: Medicare Other

## 2016-05-18 ENCOUNTER — Ambulatory Visit (INDEPENDENT_AMBULATORY_CARE_PROVIDER_SITE_OTHER): Payer: Medicare Other | Admitting: Internal Medicine

## 2016-05-18 VITALS — BP 113/83 | HR 88 | Temp 98.0°F | Ht 75.0 in | Wt 262.0 lb

## 2016-05-18 VITALS — BP 140/80 | HR 69 | Temp 98.8°F | Ht 75.0 in | Wt 262.0 lb

## 2016-05-18 DIAGNOSIS — R21 Rash and other nonspecific skin eruption: Secondary | ICD-10-CM | POA: Diagnosis not present

## 2016-05-18 DIAGNOSIS — Z7982 Long term (current) use of aspirin: Secondary | ICD-10-CM | POA: Insufficient documentation

## 2016-05-18 DIAGNOSIS — L509 Urticaria, unspecified: Secondary | ICD-10-CM

## 2016-05-18 DIAGNOSIS — Z96643 Presence of artificial hip joint, bilateral: Secondary | ICD-10-CM | POA: Diagnosis not present

## 2016-05-18 DIAGNOSIS — R350 Frequency of micturition: Secondary | ICD-10-CM | POA: Diagnosis not present

## 2016-05-18 DIAGNOSIS — N189 Chronic kidney disease, unspecified: Secondary | ICD-10-CM | POA: Insufficient documentation

## 2016-05-18 DIAGNOSIS — Z87891 Personal history of nicotine dependence: Secondary | ICD-10-CM | POA: Diagnosis not present

## 2016-05-18 DIAGNOSIS — S93422A Sprain of deltoid ligament of left ankle, initial encounter: Secondary | ICD-10-CM

## 2016-05-18 DIAGNOSIS — I10 Essential (primary) hypertension: Secondary | ICD-10-CM | POA: Diagnosis not present

## 2016-05-18 DIAGNOSIS — Z0000 Encounter for general adult medical examination without abnormal findings: Secondary | ICD-10-CM

## 2016-05-18 DIAGNOSIS — I129 Hypertensive chronic kidney disease with stage 1 through stage 4 chronic kidney disease, or unspecified chronic kidney disease: Secondary | ICD-10-CM | POA: Insufficient documentation

## 2016-05-18 DIAGNOSIS — Z79899 Other long term (current) drug therapy: Secondary | ICD-10-CM | POA: Insufficient documentation

## 2016-05-18 MED ORDER — FINASTERIDE 5 MG PO TABS: ORAL_TABLET | ORAL | 4 refills | Status: DC

## 2016-05-18 MED ORDER — HYDROXYZINE HCL 25 MG PO TABS: 25.0000 mg | ORAL_TABLET | Freq: Four times a day (QID) | ORAL | 0 refills | Status: DC

## 2016-05-18 MED ORDER — PREDNISONE 10 MG PO TABS: 20.0000 mg | ORAL_TABLET | Freq: Two times a day (BID) | ORAL | 0 refills | Status: DC

## 2016-05-18 NOTE — Progress Notes (Signed)
Facility  [place]    Place of Service:   OFFICE    Allergies  Allergen Reactions  . Amitriptyline   . Azithromycin     Stomach pain  . Flexeril [Cyclobenzaprine]     Causes dizziness    Chief Complaint  Patient presents with  . Medical Management of Chronic Issues    3 month medication management blood pressure, depression, chronic pain.    HPI:  Seen here 05/12/16 for rash. Got worse despite the steroid cream. Went to the ER at [facility] today. Rash had spread to his torso. Prednisone and hydroxyzine were recommended  Left ankle sprain noted last visit here. No fx on xray 05/13/16.  In the last couple of months, he has noted urgency to the point of incontinence. Has also had need to double void at times. Proscar seemed to help earlier this year.  Medications: Patient's Medications  New Prescriptions   No medications on file  Previous Medications   ASPIRIN EC 81 MG TABLET    Take 1 tablet (81 mg total) by mouth daily.   HYDROXYZINE (ATARAX/VISTARIL) 25 MG TABLET    Take 1 tablet (25 mg total) by mouth every 6 (six) hours.   LOSARTAN-HYDROCHLOROTHIAZIDE (HYZAAR) 100-25 MG TABLET    TAKE ONE TABLET BY MOUTH ONCE DAILY TO CONTROL BLOOD PRESSURE   ONDANSETRON (ZOFRAN) 4 MG TABLET    TAKE 1 TABLET BY MOUTH EVERY 8 HOURS AS NEEDED FOR NAUSEA   OXYCODONE (ROXICODONE) 30 MG IMMEDIATE RELEASE TABLET    One every 3 hours if needed to control pain   PREDNISONE (DELTASONE) 10 MG TABLET    Take 2 tablets (20 mg total) by mouth 2 (two) times daily with a meal.   TRIAMCINOLONE CREAM (KENALOG) 0.1 %    Apply to rash on buttocks twice daily  Modified Medications   No medications on file  Discontinued Medications   No medications on file    Review of Systems  Constitutional: Positive for appetite change and fatigue. Negative for chills and fever.  HENT: Positive for hearing loss. Negative for ear pain, sinus pressure, trouble swallowing and voice change.   Eyes: Negative.     Cardiovascular:       Hx hypertension.   Gastrointestinal: Negative for abdominal distention, blood in stool, constipation and diarrhea.  Endocrine: Negative.   Genitourinary: Positive for urgency.       Urine incontinence  Musculoskeletal: Positive for joint swelling (left ankle laterally).       Increased shoulder pains. Painful to raise or rotate shoulders.  Skin: Positive for rash.       Rash on torso. Painless atrophic plaque of the left forearm.  Neurological: Negative.   Hematological: Negative.   Psychiatric/Behavioral: Positive for dysphoric mood and sleep disturbance (Insomnia). Negative for self-injury and suicidal ideas. The patient is nervous/anxious.     Vitals:   05/18/16 1507  BP: 113/83  Pulse: 88  Temp: 98 F (36.7 C)  TempSrc: Oral  SpO2: 100%  Weight: 262 lb (118.8 kg)  Height: _0  (1.905 m)   Body mass index is 32.75 kg/m. Wt Readings from Last 3 Encounters:  05/18/16 262 lb (118.8 kg)  05/18/16 262 lb (118.8 kg)  05/18/16 263 lb (119.3 kg)      Physical Exam  Constitutional: He is oriented to person, place, and time. He appears well-developed and well-nourished. No distress.  Moderately overweight  HENT:  Head: Normocephalic and atraumatic.  Significant partial deafness bilaterally.  Eyes:  Conjunctivae and EOM are normal. Pupils are equal, round, and reactive to light.  Neck: Normal range of motion. Neck supple. No JVD present. No tracheal deviation present. No thyromegaly present.  Cardiovascular: Normal rate, regular rhythm, normal heart sounds and intact distal pulses.  Exam reveals no gallop and no friction rub.   No murmur heard. Pulmonary/Chest: Effort normal and breath sounds normal. No respiratory distress. He has no wheezes. He has no rales. He exhibits no tenderness.  Abdominal: Soft. Bowel sounds are normal. He exhibits no distension and no mass. There is no tenderness.  Genitourinary: Rectum normal, prostate normal and penis  normal. Rectal exam shows guaiac negative stool.  Musculoskeletal: He exhibits no edema or tenderness.  Left lateral ankle and foot are tender and swollen. Hammer toe right 3rd. Painful shoulders with elevation or rotation. Pain in both knees. Pushes off to get out of a chair. Unable to fully squat and come back up.  Lymphadenopathy:    He has no cervical adenopathy.  Neurological: He is alert and oriented to person, place, and time. No cranial nerve deficit.  05/18/16 MMSE 29/30. Passed clock drawing.  Skin: Rash (extensive erythematous rash covering torso) noted. There is erythema. No pallor.  Atrophic plaque of the left forearm. Healed laceration of the left arm   Psychiatric: His speech is normal and behavior is normal. Judgment and thought content normal. Cognition and memory are normal. He exhibits a depressed mood.  Anxiety and depressed mood.    Labs reviewed: Lab Summary Latest Ref Rng & Units 04/02/2016 11/21/2015  Hemoglobin - (None) (None)  Hematocrit - (None) (None)  White count - (None) (None)  Platelet count - (None) (None)  Sodium 135 - 146 mmol/L 139 140  Potassium 3.5 - 5.3 mmol/L 4.4 4.4  Calcium 8.6 - 10.3 mg/dL 9.8 9.5  Phosphorus - (None) (None)  Creatinine 0.70 - 1.18 mg/dL 1.28(H) 1.12  AST - (None) (None)  Alk Phos - (None) (None)  Bilirubin - (None) (None)  Glucose 65 - 99 mg/dL 86 90  Cholesterol 125 - 200 mg/dL 155 (None)  HDL cholesterol >=40 mg/dL 49 (None)  Triglycerides <150 mg/dL 80 (None)  LDL Direct - (None) (None)  LDL Calc <130 mg/dL 90 (None)  Total protein - (None) (None)  Albumin - (None) (None)  Some recent data might be hidden   Lab Results  Component Value Date   TSH 1.520 11/25/2015   TSH 1.57 03/22/2012   TSH 1.219 03/23/2011   Lab Results  Component Value Date   BUN 22 04/02/2016   BUN 21 11/21/2015   BUN 22 02/24/2015   Lab Results  Component Value Date   HGBA1C (H) 08/10/2010    5.8 (NOTE)                                                                        According to the ADA Clinical Practice Recommendations for 2011, when HbA1c is used as a screening test:   >=6.5%   Diagnostic of Diabetes Mellitus           (if abnormal result  is confirmed)  5.7-6.4%   Increased risk of developing Diabetes Mellitus  References:Diagnosis and Classification of Diabetes Mellitus,Diabetes Care,2011,34(Suppl 1):S62-S69 and Standards  of Medical Care in         Diabetes - 2011,Diabetes Care,2011,34  (Suppl 1):S11-S61.    Assessment/Plan  1. Rash and nonspecific skin eruption Continue prednisone for 5 days. Resume if rash reoccurs.  2. Sprain of deltoid ligament of left ankle, initial encounter improving  3. Essential hypertension improved  4. Frequency of urination Resume finasteride 5 mg qd.

## 2016-05-18 NOTE — Patient Instructions (Addendum)
[name] [name] , Thank you for taking time to come for your Medicare Wellness Visit. I appreciate your ongoing commitment to your health goals. Please review the following plan we discussed and let me know if I can assist you in the future.   These are the goals we discussed: Goals    . Increase water intake          Starting 05/18/16; I will attempt to increase my water intake to 5 glasses daily.        This is a list of the screening recommended for you and due dates:  Health Maintenance  Topic Date Due  . Tetanus Vaccine  06/01/2021  . Flu Shot  Completed  . Shingles Vaccine  Completed  . Pneumonia vaccines  Completed  Preventive Care for Adults  A healthy lifestyle and preventive care can promote health and wellness. Preventive health guidelines for adults include the following key practices.  . A routine yearly physical is a good way to check with your health care provider about your health and preventive screening. It is a chance to share any concerns and updates on your health and to receive a thorough exam.  . Visit your dentist for a routine exam and preventive care every 6 months. Brush your teeth twice a day and floss once a day. Good oral hygiene prevents tooth decay and gum disease.  . The frequency of eye exams is based on your age, health, family medical history, use  of contact lenses, and other factors. Follow your health care provider's ecommendations for frequency of eye exams.  . Eat a healthy diet. Foods like vegetables, fruits, whole grains, low-fat dairy products, and lean protein foods contain the nutrients you need without too many calories. Decrease your intake of foods high in solid fats, added sugars, and salt. Eat the right amount of calories for you. Get information about a proper diet from your health care provider, if necessary.  . Regular physical exercise is one of the most important things you can do for your health. Most adults should get at least 150  minutes of moderate-intensity exercise (any activity that increases your heart rate and causes you to sweat) each week. In addition, most adults need muscle-strengthening exercises on 2 or more days a week.  Silver Sneakers may be a benefit available to you. To determine eligibility, you may visit the website: www.silversneakers.com or contact program at 8088554269 Mon-Fri between 8AM-8PM.   . Maintain a healthy weight. The body mass index (BMI) is a screening tool to identify possible weight problems. It provides an estimate of body fat based on height and weight. Your health care provider can find your BMI and can help you achieve or maintain a healthy weight.   For adults 20 years and older: ? A BMI below 18.5 is considered underweight. ? A BMI of 18.5 to 24.9 is normal. ? A BMI of 25 to 29.9 is considered overweight. ? A BMI of 30 and above is considered obese.   . Maintain normal blood lipids and cholesterol levels by exercising and minimizing your intake of saturated fat. Eat a balanced diet with plenty of fruit and vegetables. Blood tests for lipids and cholesterol should begin at age 15 and be repeated every 5 years. If your lipid or cholesterol levels are high, you are over 50, or you are at high risk for heart disease, you may need your cholesterol levels checked more frequently. Ongoing high lipid and cholesterol levels should be treated  with medicines if diet and exercise are not working.  . If you smoke, find out from your health care provider how to quit. If you do not use tobacco, please do not start.  . If you choose to drink alcohol, please do not consume more than 2 drinks per day. One drink is considered to be 12 ounces (355 mL) of beer, 5 ounces (148 mL) of wine, or 1.5 ounces (44 mL) of liquor.  . If you are 47-64 years old, ask your health care provider if you should take aspirin to prevent strokes.  . Use sunscreen. Apply sunscreen liberally and repeatedly throughout  the day. You should seek shade when your shadow is shorter than you. Protect yourself by wearing long sleeves, pants, a wide-brimmed hat, and sunglasses year round, whenever you are outdoors.  . Once a month, do a whole body skin exam, using a mirror to look at the skin on your back. Tell your health care provider of new moles, moles that have irregular borders, moles that are larger than a pencil eraser, or moles that have changed in shape or color.

## 2016-05-18 NOTE — Progress Notes (Signed)
Subjective:   [name] [name]. is a 75 y.o. male who presents for an Initial Medicare Annual Wellness Visit.  Review of Systems      Objective:    Today's Vitals   05/18/16 1447 05/18/16 1449  BP: 140/80   Pulse: 69   Temp: 98.8 F (37.1 C)   TempSrc: Oral   SpO2: 99%   Weight: 262 lb (118.8 kg)   Height: 6\' 3"  (1.905 m)   PainSc: 8  8    Body mass index is 32.75 kg/m.  Current Medications (verified) Outpatient Encounter Prescriptions as of 05/18/2016  Medication Sig  . aspirin EC 81 MG tablet Take 1 tablet (81 mg total) by mouth daily.  . hydrOXYzine (ATARAX/VISTARIL) 25 MG tablet Take 1 tablet (25 mg total) by mouth every 6 (six) hours.  Marland Kitchen losartan-hydrochlorothiazide (HYZAAR) 100-25 MG tablet TAKE ONE TABLET BY MOUTH ONCE DAILY TO CONTROL BLOOD PRESSURE  . ondansetron (ZOFRAN) 4 MG tablet TAKE 1 TABLET BY MOUTH EVERY 8 HOURS AS NEEDED FOR NAUSEA  . oxycodone (ROXICODONE) 30 MG immediate release tablet One every 3 hours if needed to control pain  . predniSONE (DELTASONE) 10 MG tablet Take 2 tablets (20 mg total) by mouth 2 (two) times daily with a meal.  . triamcinolone cream (KENALOG) 0.1 % Apply to rash on buttocks twice daily  . [DISCONTINUED] methocarbamol (ROBAXIN) 500 MG tablet Take 1 tablet (500 mg total) by mouth at bedtime as needed for muscle spasms.   No facility-administered encounter medications on file as of 05/18/2016.     Allergies (verified) Amitriptyline; Azithromycin; and Flexeril [cyclobenzaprine]   History: Past Medical History:  Diagnosis Date  . Allergic rhinitis due to pollen   . Allergy   . Anxiety state, unspecified   . Arthritis    "right shoulder" (05/03/2012)  . Atrial flutter ([place])    a. dx after inguinal hernia repair in 10/12 => seen by Dr. Acie Fredrickson  . Calculus of kidney   . Cervicalgia   . Cervicalgia   . Clostridium difficile colitis   . Complications affecting other specified body systems, hypertension   . Coronary  atherosclerosis of unspecified type of vessel, native or graft   . Degeneration of cervical intervertebral disc   . Depression   . Depressive disorder, not elsewhere classified   . GERD (gastroesophageal reflux disease)   . HTN (hypertension)   . Hx of echocardiogram    a. Echo 11/12:  Mod LVH, EF 55-60%, MAC, mod LAE, mild RAE  . Impotence of organic origin   . Inguinal hernia without mention of obstruction or gangrene, unilateral or unspecified, (not specified as recurrent)   . Insomnia, unspecified   . Intestinal infection due to Clostridium difficile   . Irritable bowel syndrome   . Kidney stone    "they just passed" (05/03/2012)  . Lumbago   . Nonspecific abnormal electrocardiogram (ECG) (EKG)   . Nonspecific abnormal results of pulmonary system function study   . Obesity, unspecified   . Osteoarthrosis, unspecified whether generalized or localized, unspecified site   . Other malaise and fatigue   . Other specified cardiac dysrhythmias(427.89)   . Pain in joint, ankle and foot   . Pain in joint, lower leg   . Pain in joint, pelvic region and thigh   . Pain in joint, shoulder region   . Partial deafness   . Rash and other nonspecific skin eruption   . Sacroiliitis, not elsewhere classified ([place])   .  Sacroiliitis, not elsewhere classified ([place])   . Spasm of muscle   . Special screening for malignant neoplasm of prostate   . Unspecified constipation   . Unspecified hereditary and idiopathic peripheral neuropathy    Past Surgical History:  Procedure Laterality Date  . APPENDECTOMY  ~ 1956  . ATRIAL FLUTTER ABLATION N/A 05/03/2012   Procedure: ATRIAL FLUTTER ABLATION;  Surgeon: Evans Lance, MD;  Location[facility] C[facility] LAB;  Service: Cardiovascular;  Laterality: N/A;  . CARDIAC CATHETERIZATION  08/1999; 05/03/2012   normal coronary arteries;   . CARPAL TUNNEL WITH CUBITAL TUNNEL  2004   "right" (05/03/2012)  . COLONOSCOPY  06/06/2008   severe sigmoid diverticulosis and internal  hemorrhoids  . ESOPHAGOGASTRODUODENOSCOPY  06/06/2008   normal  . INGUINAL HERNIA REPAIR  03/23/11   "left" (05/03/2012)  . POSTERIOR LUMBAR FUSION  1989  . reconstruction (r) foot surgery     DR SUE   . RENAL CYST EXCISION     pt denies this hx on 05/03/2012  . [place]  . SPINE SURGERY  1969   Pantopaque Myelography and spinal infusion- Dr. Lyman Speller  . TONSILLECTOMY  ~ 1946  . TOTAL HIP ARTHROPLASTY  1999-2000   bilateral   Family History  Problem Relation Age of Onset  . Hypertension Father   . Colon cancer Neg Hx    Social History   Occupational History  . Not on file.   Social History Main Topics  . Smoking status: Former Smoker    Packs/day: 2.00    Years: 5.00    Types: Cigarettes  . Smokeless tobacco: Never Used     Comment: 05/03/2012 "smoked from age 32 to 78"  . Alcohol use 0.0 oz/week  . Drug use: No  . Sexual activity: Yes    Partners: Female   Tobacco Counseling Counseling given: No   Activities of Daily Living No flowsheet data found.  Immunizations and Health Maintenance Immunization History  Administered Date(s) Administered  . Influenza,inj,Quad PF,36+ Mos 02/20/2014, 02/04/2015, 01/27/2016  . Influenza-Unspecified 02/24/2010, 04/02/2011, 02/02/2012, 01/29/2013  . Pneumococcal Conjugate-13 05/27/2015  . Pneumococcal Polysaccharide-23 05/27/2009  . Tdap 06/02/2011  . Zoster 06/01/2011   There are no preventive care reminders to display for this patient.  Patient Care Team: Estill Dooms, MD as PCP - General (Internal Medicine)  Indicate any recent Medical Services you may have received from other than [facility] providers in the past year (date may be approximate).    Assessment:   This is a routine wellness examination for [employer]  Hearing/Vision screen Hearing Screening Comments: Last hearing screen done in June 2017; goes to [facility]. Hearing aid in right ear.  Vision Screening Comments: Last eye exam done 2017 done at  [facility].   Dietary issues and exercise activities discussed:    Goals    . Increase water intake          Starting 05/18/16; I will attempt to increase my water intake to 5 glasses daily.       Depression Screen PHQ 2/9 Scores 05/18/2016 04/16/2016 04/02/2016 11/10/2015  PHQ - 2 Score 2 0 0 0  PHQ- 9 Score 13 - - -    Fall Risk Fall Risk  05/18/2016 04/16/2016 04/02/2016 01/02/2016 11/25/2015  Falls in the past year? Yes No No No No  Number falls in past yr: 1 - - - -  Injury with Fall? Yes - - - -  Follow up Falls prevention discussed - - - -  Cognitive Function: MMSE - Mini Mental State Exam 12/04/2014  Orientation to time 5  Orientation to Place 5  Registration 3  Attention/ Calculation 5  Recall 3  Language- name 2 objects 2  Language- repeat 1  Language- follow 3 step command 3  Language- read & follow direction 1  Write a sentence 1  Copy design 1  Total score 30        Screening Tests Health Maintenance  Topic Date Due  . TETANUS/TDAP  06/01/2021  . INFLUENZA VACCINE  Completed  . ZOSTAVAX  Completed  . PNA vac Low Risk Adult  Completed        Plan:    I have personally reviewed and addressed the Medicare Annual Wellness questionnaire and have noted the following in the patient's chart:  A. Medical and social history B. Use of alcohol, tobacco or illicit drugs  C. Current medications and supplements D. Functional ability and status E.  Nutritional status F.  Physical activity G. Advance directives H. List of other physicians I.  Hospitalizations, surgeries, and ER visits in previous 12 months J.  [place] to include hearing, vision, cognitive, depression L. Referrals and appointments - none  In addition, I have reviewed and discussed with patient certain preventive protocols, quality metrics, and best practice recommendations. A written personalized care plan for preventive services as well as general preventive health  recommendations were provided to patient.  See attached scanned questionnaire for additional information.   Signed,   Allyn Kenner, LPN Health Advisor  I have reviewed the information entered by the Health Advisor. I was present in the office during the time of patient interaction and was available for consultation. I agree with the documentation and advice.  Viviann Spare Nyoka Cowden, MD

## 2016-05-18 NOTE — ED Provider Notes (Signed)
[facility] DEPT Provider Note   CSN: [id number] Arrival date & time: 05/18/16  [id number]     History   Chief Complaint No chief complaint on file.   HPI [name] [name]. is a 75 y.o. male.  Patient is a 75 year old male with history of hypertension. He presents today for evaluation of rash. He started several days ago 2 small areas to his lower back which have since spread throughout his torso. He was initially given triamcinolone cream by his primary Dr., however this has not helped. He reports diffuse itching and irritation. He denies any new contacts or exposures. He denies any difficulty breathing or swallowing.   The history is provided by the patient.    Past Medical History:  Diagnosis Date  . Allergic rhinitis due to pollen   . Allergy   . Anxiety state, unspecified   . Arthritis    "right shoulder" (05/03/2012)  . Atrial flutter ([place])    a. dx after inguinal hernia repair in 10/12 => seen by Dr. Acie Fredrickson  . Calculus of kidney   . Cervicalgia   . Cervicalgia   . Clostridium difficile colitis   . Complications affecting other specified body systems, hypertension   . Coronary atherosclerosis of unspecified type of vessel, native or graft   . Degeneration of cervical intervertebral disc   . Depression   . Depressive disorder, not elsewhere classified   . GERD (gastroesophageal reflux disease)   . HTN (hypertension)   . Hx of echocardiogram    a. Echo 11/12:  Mod LVH, EF 55-60%, MAC, mod LAE, mild RAE  . Impotence of organic origin   . Inguinal hernia without mention of obstruction or gangrene, unilateral or unspecified, (not specified as recurrent)   . Insomnia, unspecified   . Intestinal infection due to Clostridium difficile   . Irritable bowel syndrome   . Kidney stone    "they just passed" (05/03/2012)  . Lumbago   . Nonspecific abnormal electrocardiogram (ECG) (EKG)   . Nonspecific abnormal results of pulmonary system function study   . Obesity,  unspecified   . Osteoarthrosis, unspecified whether generalized or localized, unspecified site   . Other malaise and fatigue   . Other specified cardiac dysrhythmias(427.89)   . Pain in joint, ankle and foot   . Pain in joint, lower leg   . Pain in joint, pelvic region and thigh   . Pain in joint, shoulder region   . Partial deafness   . Rash and other nonspecific skin eruption   . Sacroiliitis, not elsewhere classified ([place])   . Sacroiliitis, not elsewhere classified ([place])   . Spasm of muscle   . Special screening for malignant neoplasm of prostate   . Unspecified constipation   . Unspecified hereditary and idiopathic peripheral neuropathy     Patient Active Problem List   Diagnosis Date Noted  . Sprain of ankle 05/12/2016  . Rash and nonspecific skin eruption 01/06/2016  . Pain in both knees 11/25/2015  . Myalgia and myositis 11/25/2015  . Weak 11/25/2015  . Laceration of arm 09/03/2015  . Insomnia 09/03/2015  . Frequency of urination 05/27/2015  . Post herpetic Neuropathy 03/12/2015  . Depression, major, recurrent, moderate ([place]) 03/12/2015  . Stress at home 01/26/2015  . Pain in left foot 06/26/2014  . Corn of toe 01/09/2014  . Allergic rhinitis 10/01/2013  . Rotator cuff tear arthropathy of right shoulder 10/01/2013  . Pain in joint, shoulder region 01/02/2013  . HTN (hypertension)   .  Partial deafness   . Obesity   . Hammertoe 10/04/2012  . CKD (chronic kidney disease) 03/22/2012  . Chronic constipation 11/13/2010    Past Surgical History:  Procedure Laterality Date  . APPENDECTOMY  ~ 1956  . ATRIAL FLUTTER ABLATION N/A 05/03/2012   Procedure: ATRIAL FLUTTER ABLATION;  Surgeon: Evans Lance, MD;  Location: [facility] CATH LAB;  Service: Cardiovascular;  Laterality: N/A;  . CARDIAC CATHETERIZATION  08/1999; 05/03/2012   normal coronary arteries;   . CARPAL TUNNEL WITH CUBITAL TUNNEL  2004   "right" (05/03/2012)  . COLONOSCOPY  06/06/2008   severe sigmoid diverticulosis  and internal hemorrhoids  . ESOPHAGOGASTRODUODENOSCOPY  06/06/2008   normal  . INGUINAL HERNIA REPAIR  03/23/11   "left" (05/03/2012)  . POSTERIOR LUMBAR FUSION  1989  . reconstruction (r) foot surgery     DR SUE   . RENAL CYST EXCISION     pt denies this hx on 05/03/2012  . [place]  . SPINE SURGERY  1969   Pantopaque Myelography and spinal infusion- Dr. Lyman Speller  . TONSILLECTOMY  ~ 1946  . TOTAL HIP ARTHROPLASTY  1999-2000   bilateral       Home Medications    Prior to Admission medications   Medication Sig Start Date End Date Taking? Authorizing Provider  aspirin EC 81 MG tablet Take 1 tablet (81 mg total) by mouth daily. 07/04/12   Evans Lance, MD  losartan-hydrochlorothiazide (HYZAAR) 100-25 MG tablet TAKE ONE TABLET BY MOUTH ONCE DAILY TO CONTROL BLOOD PRESSURE 12/23/15   Estill Dooms, MD  methocarbamol (ROBAXIN) 500 MG tablet Take 1 tablet (500 mg total) by mouth at bedtime as needed for muscle spasms. 04/16/16   Tiffany L Reed, DO  ondansetron (ZOFRAN) 4 MG tablet TAKE 1 TABLET BY MOUTH EVERY 8 HOURS AS NEEDED FOR NAUSEA 01/15/16   Estill Dooms, MD  oxycodone (ROXICODONE) 30 MG immediate release tablet One every 3 hours if needed to control pain 04/30/16   Tiffany L Reed, DO  triamcinolone cream (KENALOG) 0.1 % Apply to rash on buttocks twice daily 05/12/16   Estill Dooms, MD    Family History Family History  Problem Relation Age of Onset  . Hypertension Father   . Colon cancer Neg Hx     Social History Social History  Substance Use Topics  . Smoking status: Former Smoker    Packs/day: 2.00    Years: 5.00    Types: Cigarettes  . Smokeless tobacco: Never Used     Comment: 05/03/2012 "smoked from age 17 to 17"  . Alcohol use 0.0 oz/week     Allergies   Amitriptyline; Azithromycin; and Flexeril [cyclobenzaprine]   Review of Systems Review of Systems  All other systems reviewed and are negative.    Physical Exam Updated Vital Signs BP  113/83 (BP Location: Right Arm)   Pulse 88   Temp 98 F (36.7 C) (Oral)   Resp 18   Ht 6\' 3"  (1.905 m)   Wt 263 lb (119.3 kg)   SpO2 100%   BMI 32.87 kg/m   Physical Exam  Constitutional: He is oriented to person, place, and time. He appears well-developed and well-nourished. No distress.  HENT:  Head: Normocephalic and atraumatic.  Neck: Normal range of motion. Neck supple.  Cardiovascular: Normal rate.   No murmur heard. Pulmonary/Chest: Effort normal and breath sounds normal. No respiratory distress.  Neurological: He is alert and oriented to person, place, and  time.  Skin: Skin is warm and dry. Rash noted. He is not diaphoretic.  There is a diffuse urticarial rash present to the entire torso and arms.  Nursing note and vitals reviewed.    ED Treatments / Results  Labs (all labs ordered are listed, but only abnormal results are displayed) Labs Reviewed - No data to display  EKG  EKG Interpretation None       Radiology No results found.  Procedures Procedures (including critical care time)  Medications Ordered in ED Medications - No data to display   Initial Impression / Assessment and Plan / ED Course  I have reviewed the triage vital signs and the nursing notes.  Pertinent labs & imaging results that were available during my care of the patient were reviewed by me and considered in my medical decision making (see chart for details).  Clinical Course     Patient appears to have hives. This will be treated with prednisone and hydroxyzine. He is to follow-up with his primary Dr. if not improving, and return to the ER if worsening.  Final Clinical Impressions(s) / ED Diagnoses   Final diagnoses:  None    New Prescriptions New Prescriptions   No medications on file     Veryl Speak, MD 05/18/16 [phone]

## 2016-05-18 NOTE — Patient Instructions (Signed)
If rash breaks out again at the end of your current prednisone, resume it at a lower dose of 20 mg daily.

## 2016-05-18 NOTE — Discharge Instructions (Signed)
Prednisone and hydroxyzine as prescribed.  Return to the emergency department if you develop difficulty breathing or swallowing, or if your symptoms significantly worsen or change.

## 2016-05-18 NOTE — Progress Notes (Signed)
Quick Notes   Health Maintenance:  Pt is up to date on maintenance;    Abnormal Screen: None: MMSE-29/30 passed clock test.     Patient Concerns:  Pt has been under an extreme amount of stress lately, broke out in a rash. Went to ED this morning b/c of the rash, told it was Hives. Requests something to help w/ sleeping, as he has not had sleep in 3 nights.     Nurse Concerns:  Pt scored 13 on PHQ9. States he is not depressed, "i'm feeling blue."   I have reviewed the information entered by the [employer]. I was present in the office during the time of patient interaction and was available for consultation. I agree with the documentation and advice.  Viviann Spare Nyoka Cowden, MD

## 2016-05-18 NOTE — ED Triage Notes (Addendum)
Patient c/o rash and itching on the trunk x 3 days. Patient states he has used Benadryl cream and cortisone cream with no relief.

## 2016-05-28 ENCOUNTER — Other Ambulatory Visit: Payer: Self-pay | Admitting: *Deleted

## 2016-05-28 DIAGNOSIS — M12811 Other specific arthropathies, not elsewhere classified, right shoulder: Secondary | ICD-10-CM

## 2016-05-28 DIAGNOSIS — M75101 Unspecified rotator cuff tear or rupture of right shoulder, not specified as traumatic: Principal | ICD-10-CM

## 2016-05-28 MED ORDER — OXYCODONE HCL 30 MG PO TABS: ORAL_TABLET | ORAL | 0 refills | Status: DC

## 2016-05-28 NOTE — Telephone Encounter (Signed)
Patient requested and will pick up 

## 2016-06-22 ENCOUNTER — Other Ambulatory Visit: Payer: Self-pay | Admitting: Internal Medicine

## 2016-06-25 ENCOUNTER — Other Ambulatory Visit: Payer: Self-pay | Admitting: Internal Medicine

## 2016-06-25 ENCOUNTER — Telehealth: Payer: Self-pay | Admitting: *Deleted

## 2016-06-25 DIAGNOSIS — R21 Rash and other nonspecific skin eruption: Secondary | ICD-10-CM

## 2016-06-25 DIAGNOSIS — M75101 Unspecified rotator cuff tear or rupture of right shoulder, not specified as traumatic: Principal | ICD-10-CM

## 2016-06-25 DIAGNOSIS — M12811 Other specific arthropathies, not elsewhere classified, right shoulder: Secondary | ICD-10-CM

## 2016-06-25 MED ORDER — OXYCODONE HCL 30 MG PO TABS: ORAL_TABLET | ORAL | 0 refills | Status: DC

## 2016-06-25 NOTE — Telephone Encounter (Signed)
Patient requested refill on pain medication and will pick up. Printed.  Patient is requesting a referral to the Dermatologist. Still having some Itching and rash. Please Advise.

## 2016-06-25 NOTE — Telephone Encounter (Signed)
I entered request for derm referral.

## 2016-07-06 ENCOUNTER — Telehealth: Payer: Self-pay | Admitting: *Deleted

## 2016-07-06 NOTE — Telephone Encounter (Signed)
Patient called and stated that he has gone to Dr. Jennette Dubin @ [facility] [phone] and would like an appointment there. Patient called to schedule an appointment but they told him he needed a referral from his PCP. Entered a note on the referral Dr. Nyoka Cowden placed.

## 2016-07-22 ENCOUNTER — Telehealth: Payer: Self-pay | Admitting: *Deleted

## 2016-07-22 DIAGNOSIS — R1011 Right upper quadrant pain: Secondary | ICD-10-CM

## 2016-07-22 DIAGNOSIS — R110 Nausea: Secondary | ICD-10-CM

## 2016-07-22 NOTE — Telephone Encounter (Signed)
Patient called and stated that he was having right sided pain towards chest area with some nausea. Stated that it is worse when he eats. Wonders if he is having problems with his gallbladder. No fever, no back pain. Wonders if there is a test he can do. He stated he is not sure if its not just general pain coming from arthritis. Please Advise.

## 2016-07-22 NOTE — Telephone Encounter (Signed)
Schedule Ultrasound of the gallbladder. Come to office lab for CMP. Schedule follow up appointment.

## 2016-07-23 ENCOUNTER — Other Ambulatory Visit: Payer: Medicare Other

## 2016-07-23 DIAGNOSIS — R1011 Right upper quadrant pain: Secondary | ICD-10-CM

## 2016-07-23 DIAGNOSIS — R110 Nausea: Secondary | ICD-10-CM

## 2016-07-23 NOTE — Telephone Encounter (Signed)
Patient notified and agreed. Ultrasound order placed. Lab appointment scheduled and appointment made with Dr. Nyoka Cowden for Wed. 07/28/16.

## 2016-07-24 LAB — COMPREHENSIVE METABOLIC PANEL
ALT: 9 U/L (ref 9–46)
AST: 13 U/L (ref 10–35)
Albumin: 3.9 g/dL (ref 3.6–5.1)
Alkaline Phosphatase: 67 U/L (ref 40–115)
BILIRUBIN TOTAL: 1.5 mg/dL — AB (ref 0.2–1.2)
BUN: 18 mg/dL (ref 7–25)
CALCIUM: 9.2 mg/dL (ref 8.6–10.3)
CO2: 30 mmol/L (ref 20–31)
Chloride: 104 mmol/L (ref 98–110)
Creat: 1.35 mg/dL — ABNORMAL HIGH (ref 0.70–1.18)
GLUCOSE: 73 mg/dL (ref 65–99)
Potassium: 4.3 mmol/L (ref 3.5–5.3)
Sodium: 140 mmol/L (ref 135–146)
Total Protein: 6 g/dL — ABNORMAL LOW (ref 6.1–8.1)

## 2016-07-28 ENCOUNTER — Ambulatory Visit (INDEPENDENT_AMBULATORY_CARE_PROVIDER_SITE_OTHER): Payer: Medicare Other | Admitting: Internal Medicine

## 2016-07-28 ENCOUNTER — Encounter: Payer: Self-pay | Admitting: Internal Medicine

## 2016-07-28 VITALS — BP 144/84 | HR 68 | Temp 98.2°F | Ht 75.0 in | Wt 259.0 lb

## 2016-07-28 DIAGNOSIS — N182 Chronic kidney disease, stage 2 (mild): Secondary | ICD-10-CM

## 2016-07-28 DIAGNOSIS — R21 Rash and other nonspecific skin eruption: Secondary | ICD-10-CM

## 2016-07-28 DIAGNOSIS — I10 Essential (primary) hypertension: Secondary | ICD-10-CM

## 2016-07-28 DIAGNOSIS — R531 Weakness: Secondary | ICD-10-CM

## 2016-07-28 NOTE — Progress Notes (Signed)
Facility  [place]    Place of Service:   OFFICE    Allergies  Allergen Reactions  . Amitriptyline   . Azithromycin     Stomach pain  . Flexeril [Cyclobenzaprine]     Causes dizziness    Chief Complaint  Patient presents with  . Acute Visit    right sided pain, called 07/22/16 with pain, lab done, [place] hasn't gotten because he doesn't have any more pain.   . Medication Management    patient has been taking his HCTZ 25MG 1/2 twice daily off and on, Dr. Mariea Clonts had told him to stop it 04/16/16 and to take the combo pill daily. But Dr. Nyoka Cowden didn't tell him to stop it, so he wasn't sure not to stop it.     HPI:  Abdominal pains lasted a couple of days, then went away.  Stressed by his wife's pains from fibromyalgia.  Essential hypertension - some elevations of the SBP  Stage 2 chronic kidney disease - stable  Rash and nonspecific skin eruption - improved  Weak - generalized feeling that is likely related to the chronic stress of caring for his wife.    Medications: Patient's Medications  New Prescriptions   No medications on file  Previous Medications   ASPIRIN EC 81 MG TABLET    Take 1 tablet (81 mg total) by mouth daily.   FINASTERIDE (PROSCAR) 5 MG TABLET    One daily to treat prostate   HYDROXYZINE (ATARAX/VISTARIL) 25 MG TABLET    Take 1 tablet (25 mg total) by mouth every 6 (six) hours.   LOSARTAN-HYDROCHLOROTHIAZIDE (HYZAAR) 100-25 MG TABLET    TAKE ONE TABLET BY MOUTH ONCE DAILY TO CONTROL BLOOD PRESSURE   ONDANSETRON (ZOFRAN) 4 MG TABLET    TAKE 1 TABLET BY MOUTH EVERY 8 HOURS AS NEEDED FOR NAUSEA   OXYCODONE (ROXICODONE) 30 MG IMMEDIATE RELEASE TABLET    One every 3 hours if needed to control pain   PREDNISONE (DELTASONE) 10 MG TABLET    Take 2 tablets (20 mg total) by mouth 2 (two) times daily with a meal.   TRIAMCINOLONE CREAM (KENALOG) 0.1 %    Apply to rash on buttocks twice daily  Modified Medications   No medications on file  Discontinued Medications   No  medications on file    Review of Systems  Constitutional: Positive for appetite change and fatigue. Negative for chills and fever.  HENT: Positive for hearing loss. Negative for ear pain, sinus pressure, trouble swallowing and voice change.   Eyes: Negative.   Respiratory: Positive for cough (dry).   Cardiovascular:       Hx hypertension.   Gastrointestinal: Negative for abdominal distention, blood in stool, constipation and diarrhea.  Endocrine: Negative.   Genitourinary: Positive for urgency.       Urine incontinence  Musculoskeletal: Negative for joint swelling.       Increased shoulder pains. Painful to raise or rotate shoulders.  Skin: Negative for rash.       Painless atrophic plaque of the left forearm.  Neurological: Negative.   Hematological: Negative.   Psychiatric/Behavioral: Positive for dysphoric mood and sleep disturbance (Insomnia). Negative for self-injury and suicidal ideas. The patient is nervous/anxious.     Vitals:   07/28/16 1502  BP: (!) 144/84  Pulse: 68  Temp: 98.2 F (36.8 C)  TempSrc: Oral  SpO2: 94%  Weight: 259 lb (117.5 kg)  Height: '6\' 3"'  (1.905 m)   Body mass index is  32.37 kg/m. Wt Readings from Last 3 Encounters:  07/28/16 259 lb (117.5 kg)  05/18/16 262 lb (118.8 kg)  05/18/16 262 lb (118.8 kg)      Physical Exam  Constitutional: He is oriented to person, place, and time. He appears well-developed and well-nourished. No distress.  Moderately overweight  HENT:  Head: Normocephalic and atraumatic.  Significant partial deafness bilaterally.  Eyes: Conjunctivae and EOM are normal. Pupils are equal, round, and reactive to light.  Neck: Normal range of motion. Neck supple. No JVD present. No tracheal deviation present. No thyromegaly present.  Cardiovascular: Normal rate, regular rhythm, normal heart sounds and intact distal pulses.  Exam reveals no gallop and no friction rub.   No murmur heard. Pulmonary/Chest: Effort normal and breath  sounds normal. No respiratory distress. He has no wheezes. He has no rales. He exhibits no tenderness.  Abdominal: Soft. Bowel sounds are normal. He exhibits no distension and no mass. There is no tenderness.  Genitourinary: Rectum normal, prostate normal and penis normal. Rectal exam shows guaiac negative stool.  Musculoskeletal: He exhibits no edema or tenderness.  Hammer toe right 3rd. Painful shoulders with elevation or rotation. Pain in both knees. Pushes off to get out of a chair. Unable to fully squat and come back up.  Lymphadenopathy:    He has no cervical adenopathy.  Neurological: He is alert and oriented to person, place, and time. No cranial nerve deficit.  05/18/16 MMSE 29/30. Passed clock drawing.  Skin: No rash noted. There is erythema. No pallor.  Atrophic plaque of the left forearm.  Psychiatric: His speech is normal and behavior is normal. Judgment and thought content normal. Cognition and memory are normal. He exhibits a depressed mood.  Anxiety and depressed mood.    Labs reviewed: Lab Summary Latest Ref Rng & Units 07/23/2016 04/02/2016  Hemoglobin - (None) (None)  Hematocrit - (None) (None)  White count - (None) (None)  Platelet count - (None) (None)  Sodium 135 - 146 mmol/L 140 139  Potassium 3.5 - 5.3 mmol/L 4.3 4.4  Calcium 8.6 - 10.3 mg/dL 9.2 9.8  Phosphorus - (None) (None)  Creatinine 0.70 - 1.18 mg/dL 1.35(H) 1.28(H)  AST 10 - 35 U/L 13 (None)  Alk Phos 40 - 115 U/L 67 (None)  Bilirubin 0.2 - 1.2 mg/dL 1.5(H) (None)  Glucose 65 - 99 mg/dL 73 86  Cholesterol 125 - 200 mg/dL (None) 155  HDL cholesterol >=40 mg/dL (None) 49  Triglycerides <150 mg/dL (None) 80  LDL Direct - (None) (None)  LDL Calc <130 mg/dL (None) 90  Total protein 6.1 - 8.1 g/dL 6.0(L) (None)  Albumin 3.6 - 5.1 g/dL 3.9 (None)  Some recent data might be hidden   Lab Results  Component Value Date   TSH 1.520 11/25/2015   TSH 1.57 03/22/2012   TSH 1.219 03/23/2011   Lab Results    Component Value Date   BUN 18 07/23/2016   BUN 22 04/02/2016   BUN 21 11/21/2015   Lab Results  Component Value Date   HGBA1C (H) 08/10/2010    5.8 (NOTE)  According to the ADA Clinical Practice Recommendations for 2011, when HbA1c is used as a screening test:   >=6.5%   Diagnostic of Diabetes Mellitus           (if abnormal result  is confirmed)  5.7-6.4%   Increased risk of developing Diabetes Mellitus  References:Diagnosis and Classification of Diabetes Mellitus,Diabetes QVOH,2091,98(KICHT 1):S62-S69 and Standards of Medical Care in         Diabetes - 2011,Diabetes VGVS,2548,62  (Suppl 1):S11-S61.    Assessment/Plan  1. Essential hypertension Recommended weight loss and exercise again. Recheck in 3 weeks.  2. Stage 2 chronic kidney disease stable  3. Rash and nonspecific skin eruption Resolved.  4. Weak Stress related

## 2016-07-28 NOTE — Patient Instructions (Signed)
Try Mucinex DM for cough

## 2016-07-29 ENCOUNTER — Other Ambulatory Visit: Payer: Self-pay

## 2016-07-29 DIAGNOSIS — M12811 Other specific arthropathies, not elsewhere classified, right shoulder: Secondary | ICD-10-CM

## 2016-07-29 DIAGNOSIS — M75101 Unspecified rotator cuff tear or rupture of right shoulder, not specified as traumatic: Principal | ICD-10-CM

## 2016-07-29 MED ORDER — OXYCODONE HCL 30 MG PO TABS: ORAL_TABLET | ORAL | 0 refills | Status: DC

## 2016-08-02 ENCOUNTER — Other Ambulatory Visit: Payer: Self-pay | Admitting: Internal Medicine

## 2016-08-05 ENCOUNTER — Telehealth: Payer: Self-pay | Admitting: *Deleted

## 2016-08-05 MED ORDER — PREDNISONE 10 MG PO TABS: 20.0000 mg | ORAL_TABLET | Freq: Two times a day (BID) | ORAL | 0 refills | Status: DC

## 2016-08-05 NOTE — Telephone Encounter (Signed)
Refill his prednisone.

## 2016-08-05 NOTE — Telephone Encounter (Signed)
Faxed to pharmacy. Patient notified

## 2016-08-05 NOTE — Telephone Encounter (Signed)
Patient called and stated that he needs a refill on his Prednisone. He stated that Dr. Nyoka Cowden gave it to him to control his rash. He stated that when the rash starts he starts taking it and it helps. He stated that is starts to come back about every 10 days and he will take the Prednisone for 2 days and it goes away and helps him alot.  Is this ok to refill? Please Advise.

## 2016-08-06 ENCOUNTER — Other Ambulatory Visit: Payer: Self-pay

## 2016-08-17 ENCOUNTER — Encounter: Payer: Self-pay | Admitting: Internal Medicine

## 2016-08-17 ENCOUNTER — Ambulatory Visit (INDEPENDENT_AMBULATORY_CARE_PROVIDER_SITE_OTHER): Payer: Medicare Other | Admitting: Internal Medicine

## 2016-08-17 VITALS — BP 172/90 | HR 58 | Temp 97.5°F | Ht 75.0 in | Wt 268.0 lb

## 2016-08-17 DIAGNOSIS — E6609 Other obesity due to excess calories: Secondary | ICD-10-CM

## 2016-08-17 DIAGNOSIS — Z6833 Body mass index (BMI) 33.0-33.9, adult: Secondary | ICD-10-CM

## 2016-08-17 DIAGNOSIS — I10 Essential (primary) hypertension: Secondary | ICD-10-CM | POA: Diagnosis not present

## 2016-08-17 DIAGNOSIS — R350 Frequency of micturition: Secondary | ICD-10-CM

## 2016-08-17 DIAGNOSIS — L821 Other seborrheic keratosis: Secondary | ICD-10-CM | POA: Diagnosis not present

## 2016-08-17 DIAGNOSIS — R21 Rash and other nonspecific skin eruption: Secondary | ICD-10-CM

## 2016-08-17 MED ORDER — DOXAZOSIN MESYLATE 4 MG PO TABS: ORAL_TABLET | ORAL | 5 refills | Status: DC

## 2016-08-17 MED ORDER — FINASTERIDE 5 MG PO TABS: ORAL_TABLET | ORAL | 4 refills | Status: DC

## 2016-08-17 MED ORDER — PREDNISONE 10 MG PO TABS: ORAL_TABLET | ORAL | 0 refills | Status: DC

## 2016-08-17 NOTE — Progress Notes (Signed)
Facility  [facility]    Place of Service:   OFFICE    Allergies  Allergen Reactions  . Amitriptyline   . Azithromycin     Stomach pain  . Flexeril [Cyclobenzaprine]     Causes dizziness    Chief Complaint  Patient presents with  . Medical Management of Chronic Issues    6 week management of blood pressure  . Headache    diarrhea, vomiting (once) last 48 hours  . Sore    area behind left lower leg below knee for 2 weeks    HPI:  Last seen 07/28/16. BP 144/84. Recommended exercise and weight loss. He has gained 9#.  Very sedentary.  Stressed by ill health of his wife.  Medications: Patient's Medications  New Prescriptions   No medications on file  Previous Medications   ASPIRIN EC 81 MG TABLET    Take 1 tablet (81 mg total) by mouth daily.   FINASTERIDE (PROSCAR) 5 MG TABLET    One daily to treat prostate   HYDROXYZINE (ATARAX/VISTARIL) 25 MG TABLET    Take 1 tablet (25 mg total) by mouth every 6 (six) hours.   LOSARTAN-HYDROCHLOROTHIAZIDE (HYZAAR) 100-25 MG TABLET    TAKE ONE TABLET BY MOUTH ONCE DAILY TO CONTROL BLOOD PRESSURE   ONDANSETRON (ZOFRAN) 4 MG TABLET    TAKE 1 TABLET BY MOUTH EVERY 8 HOURS AS NEEDED FOR NAUSEA   OXYCODONE (ROXICODONE) 30 MG IMMEDIATE RELEASE TABLET    One every 3 hours if needed to control pain   PREDNISONE (DELTASONE) 10 MG TABLET    Take 2 tablets (20 mg total) by mouth 2 (two) times daily with a meal.   TRIAMCINOLONE CREAM (KENALOG) 0.1 %    Apply to rash on buttocks twice daily  Modified Medications   No medications on file  Discontinued Medications   No medications on file    Review of Systems  Constitutional: Positive for appetite change and fatigue. Negative for chills and fever.  HENT: Positive for hearing loss. Negative for ear pain, sinus pressure, trouble swallowing and voice change.   Eyes: Negative.   Respiratory: Positive for cough (dry).   Cardiovascular:       Hx hypertension.   Gastrointestinal: Negative for abdominal  distention, blood in stool, constipation and diarrhea.  Endocrine: Negative.   Genitourinary: Positive for urgency.       Urine incontinence  Musculoskeletal: Negative for joint swelling.       Increased shoulder pains. Painful to raise or rotate shoulders.  Skin: Negative for rash.       Painless atrophic plaque of the left forearm. Small irritating growt posterior left knee.  Neurological: Negative.   Hematological: Negative.   Psychiatric/Behavioral: Positive for dysphoric mood and sleep disturbance (Insomnia). Negative for self-injury and suicidal ideas. The patient is nervous/anxious.     Vitals:   08/17/16 1517  BP: (!) 172/90  Pulse: (!) 58  Temp: 97.5 F (36.4 C)  TempSrc: Oral  SpO2: 98%  Weight: 268 lb (121.6 kg)  Height: _0  (1.905 m)   Body mass index is 33.5 kg/m. Wt Readings from Last 3 Encounters:  08/17/16 268 lb (121.6 kg)  07/28/16 259 lb (117.5 kg)  05/18/16 262 lb (118.8 kg)      Physical Exam  Constitutional: He is oriented to person, place, and time. He appears well-developed and well-nourished. No distress.  Moderately overweight  HENT:  Head: Normocephalic and atraumatic.  Significant partial deafness bilaterally.  Eyes: Conjunctivae  and EOM are normal. Pupils are equal, round, and reactive to light.  Neck: Normal range of motion. Neck supple. No JVD present. No tracheal deviation present. No thyromegaly present.  Cardiovascular: Normal rate, regular rhythm, normal heart sounds and intact distal pulses.  Exam reveals no gallop and no friction rub.   No murmur heard. Pulmonary/Chest: Effort normal and breath sounds normal. No respiratory distress. He has no wheezes. He has no rales. He exhibits no tenderness.  Abdominal: Soft. Bowel sounds are normal. He exhibits no distension and no mass. There is no tenderness.  Genitourinary: Rectum normal, prostate normal and penis normal. Rectal exam shows guaiac negative stool.  Musculoskeletal: He  exhibits no edema or tenderness.  Hammer toe right 3rd. Painful shoulders with elevation or rotation. Pain in both knees. Pushes off to get out of a chair. Unable to fully squat and come back up.  Lymphadenopathy:    He has no cervical adenopathy.  Neurological: He is alert and oriented to person, place, and time. No cranial nerve deficit.  05/18/16 MMSE 29/30. Passed clock drawing.  Skin: No rash noted. There is erythema. No pallor.  Atrophic plaque of the left forearm. Small SK left popliteal fossa.  Psychiatric: His speech is normal and behavior is normal. Judgment and thought content normal. Cognition and memory are normal. He exhibits a depressed mood.  Anxiety and depressed mood.    Labs reviewed: Lab Summary Latest Ref Rng & Units 07/23/2016 04/02/2016  Hemoglobin - (None) (None)  Hematocrit - (None) (None)  White count - (None) (None)  Platelet count - (None) (None)  Sodium 135 - 146 mmol/L 140 139  Potassium 3.5 - 5.3 mmol/L 4.3 4.4  Calcium 8.6 - 10.3 mg/dL 9.2 9.8  Phosphorus - (None) (None)  Creatinine 0.70 - 1.18 mg/dL 1.35(H) 1.28(H)  AST 10 - 35 U/L 13 (None)  Alk Phos 40 - 115 U/L 67 (None)  Bilirubin 0.2 - 1.2 mg/dL 1.5(H) (None)  Glucose 65 - 99 mg/dL 73 86  Cholesterol 125 - 200 mg/dL (None) 155  HDL cholesterol >=40 mg/dL (None) 49  Triglycerides <150 mg/dL (None) 80  LDL Direct - (None) (None)  LDL Calc <130 mg/dL (None) 90  Total protein 6.1 - 8.1 g/dL 6.0(L) (None)  Albumin 3.6 - 5.1 g/dL 3.9 (None)  Some recent data might be hidden   Lab Results  Component Value Date   TSH 1.520 11/25/2015   TSH 1.57 03/22/2012   TSH 1.219 03/23/2011   Lab Results  Component Value Date   BUN 18 07/23/2016   BUN 22 04/02/2016   BUN 21 11/21/2015   Lab Results  Component Value Date   HGBA1C (H) 08/10/2010    5.8 (NOTE)                                                                       According to the ADA Clinical Practice Recommendations for 2011, when  HbA1c is used as a screening test:   >=6.5%   Diagnostic of Diabetes Mellitus           (if abnormal result  is confirmed)  5.7-6.4%   Increased risk of developing Diabetes Mellitus  References:Diagnosis and Classification of Diabetes Mellitus,Diabetes Care,2011,34(Suppl 1):S62-S69 and Standards of  Medical Care in         Diabetes - 2011,Diabetes Care,2011,34  (Suppl 1):S11-S61.    Assessment/Plan 1. Essential hypertension Add - doxazosin (CARDURA) 4 MG tablet; One daily to help control BP  Dispense: 30 tablet; Refill: 5  2. Class 1 obesity due to excess calories without serious comorbidity with body mass index (BMI) of 33.0 to 33.9 in adult Walk. Exercise. Reduce intake.  3. Frequency of urination - finasteride (PROSCAR) 5 MG tablet; One daily to treat prostate  Dispense: 90 tablet; Refill: 4  4. Rash and nonspecific skin eruption improved. Start tapering off prednisone. - predniSONE (DELTASONE) 10 MG tablet; Take 1-2 tablets in the morning to help rash.  Dispense: 30 tablet; Refill: 0  5. Seborrheic keratosis - Ambulatory referral to Dermatology

## 2016-08-24 DIAGNOSIS — I8310 Varicose veins of unspecified lower extremity with inflammation: Secondary | ICD-10-CM | POA: Diagnosis not present

## 2016-08-24 DIAGNOSIS — L578 Other skin changes due to chronic exposure to nonionizing radiation: Secondary | ICD-10-CM | POA: Diagnosis not present

## 2016-08-24 DIAGNOSIS — B079 Viral wart, unspecified: Secondary | ICD-10-CM | POA: Diagnosis not present

## 2016-08-25 ENCOUNTER — Telehealth: Payer: Self-pay | Admitting: *Deleted

## 2016-08-25 NOTE — Telephone Encounter (Signed)
Different classes  Losartan HCTZ = ARB and diuretic Cardura is alpha blocker

## 2016-08-25 NOTE — Telephone Encounter (Signed)
Patient called and wanted to know if his blood pressure medication was a Calcium Channel Blocker or a different class. Please Advise.

## 2016-08-26 ENCOUNTER — Other Ambulatory Visit: Payer: Self-pay

## 2016-08-26 ENCOUNTER — Telehealth: Payer: Self-pay | Admitting: Internal Medicine

## 2016-08-26 DIAGNOSIS — H2513 Age-related nuclear cataract, bilateral: Secondary | ICD-10-CM | POA: Diagnosis not present

## 2016-08-26 DIAGNOSIS — M75101 Unspecified rotator cuff tear or rupture of right shoulder, not specified as traumatic: Principal | ICD-10-CM

## 2016-08-26 DIAGNOSIS — H25013 Cortical age-related cataract, bilateral: Secondary | ICD-10-CM | POA: Diagnosis not present

## 2016-08-26 DIAGNOSIS — M12811 Other specific arthropathies, not elsewhere classified, right shoulder: Secondary | ICD-10-CM

## 2016-08-26 DIAGNOSIS — H524 Presbyopia: Secondary | ICD-10-CM | POA: Diagnosis not present

## 2016-08-26 MED ORDER — OXYCODONE HCL 30 MG PO TABS: ORAL_TABLET | ORAL | 0 refills | Status: DC

## 2016-08-26 NOTE — Telephone Encounter (Signed)
Left message on voicemail with Dr.Green's response. Patient instructed to return call if questions or concerns

## 2016-08-26 NOTE — Telephone Encounter (Signed)
At Facility. Forwarded to [employer].

## 2016-08-26 NOTE — Telephone Encounter (Signed)
Let Hassan Rowan know that the patient's prescription is ready for pick- up at the front desk   Prescription sealed in envelope and placed in file cabinet at the front desk.

## 2016-09-07 ENCOUNTER — Other Ambulatory Visit: Payer: Self-pay | Admitting: Internal Medicine

## 2016-09-07 DIAGNOSIS — R21 Rash and other nonspecific skin eruption: Secondary | ICD-10-CM

## 2016-09-15 ENCOUNTER — Ambulatory Visit: Payer: Medicare Other | Admitting: Internal Medicine

## 2016-09-24 ENCOUNTER — Other Ambulatory Visit: Payer: Self-pay | Admitting: *Deleted

## 2016-09-24 DIAGNOSIS — M12811 Other specific arthropathies, not elsewhere classified, right shoulder: Secondary | ICD-10-CM

## 2016-09-24 DIAGNOSIS — M75101 Unspecified rotator cuff tear or rupture of right shoulder, not specified as traumatic: Principal | ICD-10-CM

## 2016-09-24 MED ORDER — OXYCODONE HCL 30 MG PO TABS
ORAL_TABLET | ORAL | 0 refills | Status: DC
Start: 2016-09-24 — End: 2016-10-07

## 2016-09-24 NOTE — Telephone Encounter (Signed)
Patient requested and will pick up 

## 2016-09-27 ENCOUNTER — Telehealth: Payer: Self-pay

## 2016-09-27 NOTE — Telephone Encounter (Signed)
Melia with [facility] called to indicate she needs to speak with Dr.Green regarding patient's oxycodone. Pharmacy is concerned this dose is way too high

## 2016-09-28 DIAGNOSIS — R233 Spontaneous ecchymoses: Secondary | ICD-10-CM | POA: Diagnosis not present

## 2016-09-28 DIAGNOSIS — I8310 Varicose veins of unspecified lower extremity with inflammation: Secondary | ICD-10-CM | POA: Diagnosis not present

## 2016-10-05 DIAGNOSIS — H25811 Combined forms of age-related cataract, right eye: Secondary | ICD-10-CM | POA: Diagnosis not present

## 2016-10-05 DIAGNOSIS — H25011 Cortical age-related cataract, right eye: Secondary | ICD-10-CM | POA: Diagnosis not present

## 2016-10-05 DIAGNOSIS — H2511 Age-related nuclear cataract, right eye: Secondary | ICD-10-CM | POA: Diagnosis not present

## 2016-10-07 ENCOUNTER — Other Ambulatory Visit: Payer: Self-pay | Admitting: Internal Medicine

## 2016-10-07 DIAGNOSIS — M12811 Other specific arthropathies, not elsewhere classified, right shoulder: Secondary | ICD-10-CM

## 2016-10-07 DIAGNOSIS — M75101 Unspecified rotator cuff tear or rupture of right shoulder, not specified as traumatic: Principal | ICD-10-CM

## 2016-10-07 MED ORDER — OXYCODONE HCL 30 MG PO TABS: ORAL_TABLET | ORAL | 0 refills | Status: DC

## 2016-10-07 NOTE — Telephone Encounter (Signed)
Frequency reduced. Discussed with [name] [name].

## 2016-10-25 ENCOUNTER — Other Ambulatory Visit: Payer: Self-pay | Admitting: Internal Medicine

## 2016-11-02 DIAGNOSIS — H2512 Age-related nuclear cataract, left eye: Secondary | ICD-10-CM | POA: Diagnosis not present

## 2016-11-02 DIAGNOSIS — H25812 Combined forms of age-related cataract, left eye: Secondary | ICD-10-CM | POA: Diagnosis not present

## 2016-11-02 DIAGNOSIS — H25012 Cortical age-related cataract, left eye: Secondary | ICD-10-CM | POA: Diagnosis not present

## 2016-11-08 ENCOUNTER — Encounter: Payer: Self-pay | Admitting: Internal Medicine

## 2016-11-08 ENCOUNTER — Ambulatory Visit (INDEPENDENT_AMBULATORY_CARE_PROVIDER_SITE_OTHER): Payer: Medicare Other | Admitting: Internal Medicine

## 2016-11-08 VITALS — BP 160/90 | HR 63 | Temp 98.2°F | Wt 270.0 lb

## 2016-11-08 DIAGNOSIS — L84 Corns and callosities: Secondary | ICD-10-CM

## 2016-11-08 DIAGNOSIS — Z6833 Body mass index (BMI) 33.0-33.9, adult: Secondary | ICD-10-CM | POA: Diagnosis not present

## 2016-11-08 DIAGNOSIS — I10 Essential (primary) hypertension: Secondary | ICD-10-CM

## 2016-11-08 DIAGNOSIS — J301 Allergic rhinitis due to pollen: Secondary | ICD-10-CM | POA: Diagnosis not present

## 2016-11-08 DIAGNOSIS — E6609 Other obesity due to excess calories: Secondary | ICD-10-CM | POA: Diagnosis not present

## 2016-11-08 MED ORDER — CETIRIZINE HCL 10 MG PO TABS: 10.0000 mg | ORAL_TABLET | Freq: Every day | ORAL | 2 refills | Status: DC

## 2016-11-08 MED ORDER — NEBIVOLOL HCL 5 MG PO TABS: 5.0000 mg | ORAL_TABLET | Freq: Every day | ORAL | 5 refills | Status: DC

## 2016-11-08 NOTE — Patient Instructions (Signed)
Please go back to the gym and work on eating more healthy foods to take care of yourself.  Try zyrtec daily for your sinus congestion for about 2 months.    See [facility] about your callus on your foot.  Try bystolic 5mg  daily for your blood pressure and return in 2 wks for bp check.

## 2016-11-08 NOTE — Progress Notes (Signed)
Location:  [facility] clinic Provider: Capricia Serda L. Mariea Clonts, D.O., C.M.D.  Code Status: DNR--needs form on file--has been discussed at a previous visit Goals of Care:  Advanced Directives 11/08/2016  Does Patient Have a Medical Advance Directive? Yes  Type of Advance Directive [place]  Does patient want to make changes to medical advance directive? -  Copy of [place] in Chart? Yes  Would patient like information on creating a medical advance directive? -   Chief Complaint  Patient presents with  . Acute Visit    cold    HPI: Patient is a 76 y.o. male seen today for an acute visit for possible cold.  Got all clogged up last week, dry hacking cough, sneezing.  Has pressure around his eyes.  Has taken mucinex but he's not sure it's helped.  No sick contacts.  No discolored mucus.  Small amount of clear phlegm.  No itchy watery eyes.  Is not to rub them with the surgery recently and drops.    Had cataract surgery that went very well.  Dr. Prudencio Burly at [facility].  Is impressed by how clear things are now.    Requests an outstanding podiatrist--has callus that's been shaved on for 3 years.  Recurs after 2-3 months.  Maybe the nature of it but it hurts so much that it affects his gait.    Stopped cardura due to dizziness and lightheadedness.  BP elevated w/o it.    Says he's got to lose weight, but he stays so stressed out.  He doesn't drink or smoke.  Says he eats and watches TV.  Has [employer] gym silver sneakers, but has gone once.  Has been once in a few months.  Knows it's mental to a big extent.  Feels deprived if he makes those choices.  He takes care of his wife due to her illness.  Having a good meal is the one thing that still brings him pleasure.  Most of his friends are dead.  Legs have become weak.  Lacks the motivation to do anything.  Had gone from 335 to 209 lbs with 5 days a week working out and walking.  Stopped at 64 and went back to his old  lifestyle.  He's back up to 270 lbs.  He's considered gastric bypass and says he's too old for that.  He knows what to do.  Oatmeal, walk, fruits and veggies, relax, have healthy dinner, drink water and walk more.  Doesn't want to do it.  Admits to having a problem with food his whole life.  He has no energy.  Took a self-administering depression test--melancholy, mild depression, blues.  Sees no good thing happening tomorrow.  No suicidal ideation or self-harm.    Past Medical History:  Diagnosis Date  . Allergic rhinitis due to pollen   . Allergy   . Anxiety state, unspecified   . Arthritis    "right shoulder" (05/03/2012)  . Atrial flutter ([place])    a. dx after inguinal hernia repair in 10/12 => seen by Dr. Acie Fredrickson  . Calculus of kidney   . Cervicalgia   . Cervicalgia   . Clostridium difficile colitis   . Complications affecting other specified body systems, hypertension   . Coronary atherosclerosis of unspecified type of vessel, native or graft   . Degeneration of cervical intervertebral disc   . Depression   . Depressive disorder, not elsewhere classified   . GERD (gastroesophageal reflux disease)   .  HTN (hypertension)   . Hx of echocardiogram    a. Echo 11/12:  Mod LVH, EF 55-60%, MAC, mod LAE, mild RAE  . Impotence of organic origin   . Inguinal hernia without mention of obstruction or gangrene, unilateral or unspecified, (not specified as recurrent)   . Insomnia, unspecified   . Intestinal infection due to Clostridium difficile   . Irritable bowel syndrome   . Kidney stone    "they just passed" (05/03/2012)  . Lumbago   . Nonspecific abnormal electrocardiogram (ECG) (EKG)   . Nonspecific abnormal results of pulmonary system function study   . Obesity, unspecified   . Osteoarthrosis, unspecified whether generalized or localized, unspecified site   . Other malaise and fatigue   . Other specified cardiac dysrhythmias(427.89)   . Pain in joint, ankle and foot   . Pain in  joint, lower leg   . Pain in joint, pelvic region and thigh   . Pain in joint, shoulder region   . Partial deafness   . Rash and other nonspecific skin eruption   . Sacroiliitis, not elsewhere classified ([place])   . Sacroiliitis, not elsewhere classified ([place])   . Spasm of muscle   . Special screening for malignant neoplasm of prostate   . Unspecified constipation   . Unspecified hereditary and idiopathic peripheral neuropathy     Past Surgical History:  Procedure Laterality Date  . APPENDECTOMY  ~ 1956  . ATRIAL FLUTTER ABLATION N/A 05/03/2012   Procedure: ATRIAL FLUTTER ABLATION;  Surgeon: Evans Lance, MD;  Location: [facility] CATH LAB;  Service: Cardiovascular;  Laterality: N/A;  . CARDIAC CATHETERIZATION  08/1999; 05/03/2012   normal coronary arteries;   . CARPAL TUNNEL WITH CUBITAL TUNNEL  2004   "right" (05/03/2012)  . COLONOSCOPY  06/06/2008   severe sigmoid diverticulosis and internal hemorrhoids  . ESOPHAGOGASTRODUODENOSCOPY  06/06/2008   normal  . INGUINAL HERNIA REPAIR  03/23/11   "left" (05/03/2012)  . POSTERIOR LUMBAR FUSION  1989  . reconstruction (r) foot surgery     DR SUE   . RENAL CYST EXCISION     pt denies this hx on 05/03/2012  . [place]  . SPINE SURGERY  1969   Pantopaque Myelography and spinal infusion- Dr. Lyman Speller  . TONSILLECTOMY  ~ 1946  . TOTAL HIP ARTHROPLASTY  1999-2000   bilateral    Allergies  Allergen Reactions  . Amitriptyline   . Azithromycin     Stomach pain  . Flexeril [Cyclobenzaprine]     Causes dizziness    Allergies as of 11/08/2016      Reactions   Amitriptyline    Azithromycin    Stomach pain   Flexeril [cyclobenzaprine]    Causes dizziness      Medication List       Accurate as of 11/08/16 12:04 PM. Always use your most recent med list.          aspirin EC 81 MG tablet Take 1 tablet (81 mg total) by mouth daily.   finasteride 5 MG tablet Commonly known as:  PROSCAR One daily to treat prostate   hydrOXYzine  25 MG tablet Commonly known as:  ATARAX/VISTARIL Take 1 tablet (25 mg total) by mouth every 6 (six) hours.   losartan-hydrochlorothiazide 100-25 MG tablet Commonly known as:  HYZAAR TAKE ONE TABLET BY MOUTH ONCE DAILY TO CONTROL BLOOD PRESSURE   ondansetron 4 MG tablet Commonly known as:  ZOFRAN TAKE 1 TABLET BY MOUTH EVERY 8  HOURS AS NEEDED FOR NAUSEA   oxycodone 30 MG immediate release tablet Commonly known as:  ROXICODONE One every 6 hours if needed to control pain       Review of Systems:  Review of Systems  Constitutional: Positive for malaise/fatigue. Negative for chills and fever.       Weight gain  HENT: Positive for congestion.   Eyes: Negative for blurred vision.       Seeing very clearly after cataract surgery  Respiratory: Negative for shortness of breath.   Cardiovascular: Negative for chest pain, palpitations and leg swelling.  Gastrointestinal: Negative for abdominal pain, blood in stool, constipation, diarrhea and melena.  Genitourinary: Negative for dysuria.  Musculoskeletal: Positive for joint pain. Negative for falls.  Skin: Negative for itching and rash.  Neurological: Positive for headaches. Negative for weakness.  Psychiatric/Behavioral: Positive for depression. Negative for memory loss. The patient is nervous/anxious.        Caregiver stress with wife    Health Maintenance  Topic Date Due  . INFLUENZA VACCINE  12/29/2016  . TETANUS/TDAP  06/01/2021  . PNA vac Low Risk Adult  Completed    Physical Exam: Vitals:   11/08/16 1140  BP: (!) 160/90  Pulse: 63  Temp: 98.2 F (36.8 C)  TempSrc: Oral  SpO2: 93%  Weight: 270 lb (122.5 kg)   Body mass index is 33.75 kg/m. Physical Exam  Constitutional: He is oriented to person, place, and time. He appears well-developed and well-nourished. No distress.  Obese white male  HENT:  Head: Normocephalic and atraumatic.  Right Ear: External ear normal.  Left Ear: External ear normal.  Mouth/Throat:  Oropharynx is clear and moist.  Nasal congestion  Eyes: Conjunctivae and EOM are normal. Pupils are equal, round, and reactive to light.  Neck: Neck supple. No JVD present.  Cardiovascular: Normal rate, regular rhythm, normal heart sounds and intact distal pulses.   Pulmonary/Chest: Effort normal and breath sounds normal. No respiratory distress.  Musculoskeletal: Normal range of motion.  Lymphadenopathy:    He has no cervical adenopathy.  Neurological: He is alert and oriented to person, place, and time.  Skin: Skin is warm and dry.    Labs reviewed: Basic Metabolic Panel:  Recent Labs  11/21/15 1001 11/25/15 1550 04/02/16 1208 07/23/16 1158  NA 140  --  139 140  K 4.4  --  4.4 4.3  CL 100  --  105 104  CO2 24  --  24 30  GLUCOSE 90  --  86 73  BUN 21  --  22 18  CREATININE 1.12  --  1.28* 1.35*  CALCIUM 9.5  --  9.8 9.2  MG  --   --  1.8  --   TSH  --  1.520  --   --    Liver Function Tests:  Recent Labs  07/23/16 1158  AST 13  ALT 9  ALKPHOS 67  BILITOT 1.5*  PROT 6.0*  ALBUMIN 3.9   No results for input(s): LIPASE, AMYLASE in the last 8760 hours. No results for input(s): AMMONIA in the last 8760 hours. CBC: No results for input(s): WBC, NEUTROABS, HGB, HCT, MCV, PLT in the last 8760 hours. Lipid Panel:  Recent Labs  04/02/16 1208  CHOL 155  HDL 49  LDLCALC 90  TRIG 80  CHOLHDL 3.2   Lab Results  Component Value Date   HGBA1C (H) 08/10/2010    5.8 (NOTE)  According to the ADA Clinical Practice Recommendations for 2011, when HbA1c is used as a screening test:   >=6.5%   Diagnostic of Diabetes Mellitus           (if abnormal result  is confirmed)  5.7-6.4%   Increased risk of developing Diabetes Mellitus  References:Diagnosis and Classification of Diabetes Mellitus,Diabetes VOUZ,1460,47(VVYXA 1):S62-S69 and Standards of Medical Care in         Diabetes - 2011,Diabetes JLUN,2761,84    (Suppl 1):S11-S61.    Assessment/Plan 1. Seasonal allergic rhinitis due to pollen - cetirizine (ZYRTEC) 10 MG tablet; Take 1 tablet (10 mg total) by mouth daily.  Dispense: 30 tablet; Refill: 2  2. Essential hypertension -pt stopped cardura which I agree with, but now bp has been high so start bystolic 72m po daily (samples provided and Rx sent to pharmacy) -to call back if he has any problem not just wait until he comes again -also schedule routine f/u for bp check  3. Class 1 obesity due to excess calories without serious comorbidity with body mass index (BMI) of 33.0 to 33.9 in adult -ongoing, knows what he needs to do but lacks motivation, counseled extensively today on diet and exercise--has plans to attend the gym and workout and eat less junk, but does not want to cook--wife is ill and unable  4. Callus of foot - ongoing, requests to see podiatrist to "shave it down" b/c it's affecting his walking - Ambulatory referral to Podiatry   Labs/tests ordered:   Orders Placed This Encounter  Procedures  . Ambulatory referral to Podiatry    Referral Priority:   Routine    Referral Type:   Consultation    Referral Reason:   Specialty Services Required    Requested Specialty:   Podiatry    Number of Visits Requested:   1   Next appt:  12/09/2016--f/u on bp, weight  Lakyn Mantione L. Arsen Mangione, D.O. [facility] [address] [place] [id number] Phone (Mon-Fri 8am-5pm):  [phone] Call:  [phone]& follow prompts after 5pm & weekends Office Phone:  [phone] Fax:  [phone]

## 2016-11-16 ENCOUNTER — Other Ambulatory Visit: Payer: Self-pay | Admitting: Internal Medicine

## 2016-11-22 ENCOUNTER — Encounter: Payer: Self-pay | Admitting: Nurse Practitioner

## 2016-11-22 ENCOUNTER — Ambulatory Visit (INDEPENDENT_AMBULATORY_CARE_PROVIDER_SITE_OTHER): Payer: Medicare Other | Admitting: Nurse Practitioner

## 2016-11-22 VITALS — BP 168/92 | HR 65 | Temp 97.8°F | Resp 17 | Ht 75.0 in | Wt 272.0 lb

## 2016-11-22 DIAGNOSIS — I10 Essential (primary) hypertension: Secondary | ICD-10-CM

## 2016-11-22 DIAGNOSIS — E6609 Other obesity due to excess calories: Secondary | ICD-10-CM | POA: Diagnosis not present

## 2016-11-22 DIAGNOSIS — Z6833 Body mass index (BMI) 33.0-33.9, adult: Secondary | ICD-10-CM | POA: Diagnosis not present

## 2016-11-22 MED ORDER — AMLODIPINE BESYLATE 5 MG PO TABS: 5.0000 mg | ORAL_TABLET | Freq: Every day | ORAL | 0 refills | Status: DC

## 2016-11-22 NOTE — Patient Instructions (Signed)
STOP bystolic  Start Norvasc 5 mg by mouth daily  Increase physical activity- work up to 30 mins daily  DASH Eating Plan DASH stands for "Dietary Approaches to Stop Hypertension." The DASH eating plan is a healthy eating plan that has been shown to reduce high blood pressure (hypertension). It may also reduce your risk for type 2 diabetes, heart disease, and stroke. The DASH eating plan may also help with weight loss. What are tips for following this plan? General guidelines  Avoid eating more than 2,300 mg (milligrams) of salt (sodium) a day. If you have hypertension, you may need to reduce your sodium intake to 1,500 mg a day.  Limit alcohol intake to no more than 1 drink a day for nonpregnant women and 2 drinks a day for men. One drink equals 12 oz of beer, 5 oz of wine, or 1 oz of hard liquor.  Work with your health care provider to maintain a healthy body weight or to lose weight. Ask what an ideal weight is for you.  Get at least 30 minutes of exercise that causes your heart to beat faster (aerobic exercise) most days of the week. Activities may include walking, swimming, or biking.  Work with your health care provider or diet and nutrition specialist (dietitian) to adjust your eating plan to your individual calorie needs. Reading food labels  Check food labels for the amount of sodium per serving. Choose foods with less than 5 percent of the Daily Value of sodium. Generally, foods with less than 300 mg of sodium per serving fit into this eating plan.  To find whole grains, look for the word "whole" as the first word in the ingredient list. Shopping  Buy products labeled as "low-sodium" or "no salt added."  Buy fresh foods. Avoid canned foods and premade or frozen meals. Cooking  Avoid adding salt when cooking. Use salt-free seasonings or herbs instead of table salt or sea salt. Check with your health care provider or pharmacist before using salt substitutes.  Do not fry  foods. Cook foods using healthy methods such as baking, boiling, grilling, and broiling instead.  Cook with heart-healthy oils, such as olive, canola, soybean, or sunflower oil. Meal planning   Eat a balanced diet that includes: ? 5 or more servings of fruits and vegetables each day. At each meal, try to fill half of your plate with fruits and vegetables. ? Up to 6-8 servings of whole grains each day. ? Less than 6 oz of lean meat, poultry, or fish each day. A 3-oz serving of meat is about the same size as a deck of cards. One egg equals 1 oz. ? 2 servings of low-fat dairy each day. ? A serving of nuts, seeds, or beans 5 times each week. ? Heart-healthy fats. Healthy fats called Omega-3 fatty acids are found in foods such as flaxseeds and coldwater fish, like sardines, salmon, and mackerel.  Limit how much you eat of the following: ? Canned or prepackaged foods. ? Food that is high in trans fat, such as fried foods. ? Food that is high in saturated fat, such as fatty meat. ? Sweets, desserts, sugary drinks, and other foods with added sugar. ? Full-fat dairy products.  Do not salt foods before eating.  Try to eat at least 2 vegetarian meals each week.  Eat more home-cooked food and less restaurant, buffet, and fast food.  When eating at a restaurant, ask that your food be prepared with less salt or no salt,  if possible. What foods are recommended? The items listed may not be a complete list. Talk with your dietitian about what dietary choices are best for you. Grains Whole-grain or whole-wheat bread. Whole-grain or whole-wheat pasta. Brown rice. [place]. Bulgur. Whole-grain and low-sodium cereals. Pita bread. Low-fat, low-sodium crackers. Whole-wheat flour tortillas. Vegetables Fresh or frozen vegetables (raw, steamed, roasted, or grilled). Low-sodium or reduced-sodium tomato and vegetable juice. Low-sodium or reduced-sodium tomato sauce and tomato paste. Low-sodium or  reduced-sodium canned vegetables. Fruits All fresh, dried, or frozen fruit. Canned fruit in natural juice (without added sugar). Meat and other protein foods Skinless chicken or [place]. Ground chicken or [place]. Pork with fat trimmed off. Fish and seafood. Egg whites. Dried beans, peas, or lentils. Unsalted nuts, nut butters, and seeds. Unsalted canned beans. Lean cuts of beef with fat trimmed off. Low-sodium, lean deli meat. Dairy Low-fat (1%) or fat-free (skim) milk. Fat-free, low-fat, or reduced-fat cheeses. Nonfat, low-sodium ricotta or cottage cheese. Low-fat or nonfat yogurt. Low-fat, low-sodium cheese. Fats and oils Soft margarine without trans fats. Vegetable oil. Low-fat, reduced-fat, or light mayonnaise and salad dressings (reduced-sodium). Canola, safflower, olive, soybean, and sunflower oils. Avocado. Seasoning and other foods Herbs. Spices. Seasoning mixes without salt. Unsalted popcorn and pretzels. Fat-free sweets. What foods are not recommended? The items listed may not be a complete list. Talk with your dietitian about what dietary choices are best for you. Grains Baked goods made with fat, such as croissants, muffins, or some breads. Dry pasta or rice meal packs. Vegetables Creamed or fried vegetables. Vegetables in a cheese sauce. Regular canned vegetables (not low-sodium or reduced-sodium). Regular canned tomato sauce and paste (not low-sodium or reduced-sodium). Regular tomato and vegetable juice (not low-sodium or reduced-sodium). Angie Fava. Olives. Fruits Canned fruit in a light or heavy syrup. Fried fruit. Fruit in cream or butter sauce. Meat and other protein foods Fatty cuts of meat. Ribs. Fried meat. Berniece Salines. Sausage. Bologna and other processed lunch meats. Salami. Fatback. Hotdogs. Bratwurst. Salted nuts and seeds. Canned beans with added salt. Canned or smoked fish. Whole eggs or egg yolks. Chicken or [place] with skin. Dairy Whole or 2% milk, cream, and half-and-half.  Whole or full-fat cream cheese. Whole-fat or sweetened yogurt. Full-fat cheese. Nondairy creamers. Whipped toppings. Processed cheese and cheese spreads. Fats and oils Butter. Stick margarine. Lard. Shortening. Ghee. Bacon fat. Tropical oils, such as coconut, palm kernel, or palm oil. Seasoning and other foods Salted popcorn and pretzels. Onion salt, garlic salt, seasoned salt, table salt, and sea salt. Worcestershire sauce. Tartar sauce. Barbecue sauce. Teriyaki sauce. Soy sauce, including reduced-sodium. Steak sauce. Canned and packaged gravies. Fish sauce. Oyster sauce. Cocktail sauce. Horseradish that you find on the shelf. Ketchup. Mustard. Meat flavorings and tenderizers. Bouillon cubes. Hot sauce and Tabasco sauce. Premade or packaged marinades. Premade or packaged taco seasonings. Relishes. Regular salad dressings. Where to find more information:  National Heart, Lung, and [place]: https://wilson-eaton.com/  American Heart Association: www.heart.org Summary  The DASH eating plan is a healthy eating plan that has been shown to reduce high blood pressure (hypertension). It may also reduce your risk for type 2 diabetes, heart disease, and stroke.  With the DASH eating plan, you should limit salt (sodium) intake to 2,300 mg a day. If you have hypertension, you may need to reduce your sodium intake to 1,500 mg a day.  When on the DASH eating plan, aim to eat more fresh fruits and vegetables, whole grains, lean proteins, low-fat dairy, and heart-healthy fats.  Work with your health care provider or diet and nutrition specialist (dietitian) to adjust your eating plan to your individual calorie needs. This information is not intended to replace advice given to you by your health care provider. Make sure you discuss any questions you have with your health care provider. Document Released: 05/06/2011 Document Revised: 05/10/2016 Document Reviewed: 05/10/2016 Elsevier Interactive Patient Education   2017 [employer].

## 2016-11-22 NOTE — Progress Notes (Signed)
Careteam: Patient Care Team: Lauree Chandler, NP as PCP - General (Geriatric Medicine)  Advanced Directive information Does Patient Have a Medical Advance Directive?: Yes, Type of Advance Directive: Healthcare Power of Attorney  Allergies  Allergen Reactions  . Amitriptyline   . Azithromycin     Stomach pain  . Flexeril [Cyclobenzaprine]     Causes dizziness    Chief Complaint  Patient presents with  . Follow-up    Pt is being seen for a 2 week follow up on blood pressure.      HPI: Patient is a 76 y.o. male seen in the office today to follow up blood pressure.  Has been taking bystolic 5 mg daily along with losartan/hctz.  Reports he took bystolic in the past and he had sides effects (weakness and lethargic) has these side effects again.  Blood pressure has not improved.  Pt reports a lot of stress at home. His wife's son has been a big stressor. Comes by every day.  Goes through their cabinets. Has to leave his own home because he drives him away.  Wants things in their home.  Has not eaten this morning.  Would like to lose weight. Does not do any physical activity. Eating poorly and picks up food that is easy and not healthy.  Questions if he should start smoking to reduce his appetite.  Does not wish to eat healthy.   Review of Systems:  Review of Systems  Constitutional: Positive for malaise/fatigue. Negative for chills, fever and weight loss.  HENT: Negative for tinnitus.   Respiratory: Negative for cough, sputum production and shortness of breath.   Cardiovascular: Negative for chest pain, palpitations and leg swelling.  Gastrointestinal: Negative for abdominal pain, constipation, diarrhea and heartburn.  Genitourinary: Negative for dysuria, frequency and urgency.  Musculoskeletal: Negative for back pain, falls, joint pain and myalgias.  Skin: Negative.   Neurological: Positive for weakness. Negative for dizziness and headaches.    Past Medical History:    Diagnosis Date  . Allergic rhinitis due to pollen   . Allergy   . Anxiety state, unspecified   . Arthritis    "right shoulder" (05/03/2012)  . Atrial flutter ([place])    a. dx after inguinal hernia repair in 10/12 => seen by Dr. Acie Fredrickson  . Calculus of kidney   . Cervicalgia   . Cervicalgia   . Clostridium difficile colitis   . Complications affecting other specified body systems, hypertension   . Coronary atherosclerosis of unspecified type of vessel, native or graft   . Degeneration of cervical intervertebral disc   . Depression   . Depressive disorder, not elsewhere classified   . GERD (gastroesophageal reflux disease)   . HTN (hypertension)   . Hx of echocardiogram    a. Echo 11/12:  Mod LVH, EF 55-60%, MAC, mod LAE, mild RAE  . Impotence of organic origin   . Inguinal hernia without mention of obstruction or gangrene, unilateral or unspecified, (not specified as recurrent)   . Insomnia, unspecified   . Intestinal infection due to Clostridium difficile   . Irritable bowel syndrome   . Kidney stone    "they just passed" (05/03/2012)  . Lumbago   . Nonspecific abnormal electrocardiogram (ECG) (EKG)   . Nonspecific abnormal results of pulmonary system function study   . Obesity, unspecified   . Osteoarthrosis, unspecified whether generalized or localized, unspecified site   . Other malaise and fatigue   . Other specified cardiac dysrhythmias(427.89)   .  Pain in joint, ankle and foot   . Pain in joint, lower leg   . Pain in joint, pelvic region and thigh   . Pain in joint, shoulder region   . Partial deafness   . Rash and other nonspecific skin eruption   . Sacroiliitis, not elsewhere classified ([place])   . Sacroiliitis, not elsewhere classified ([place])   . Spasm of muscle   . Special screening for malignant neoplasm of prostate   . Unspecified constipation   . Unspecified hereditary and idiopathic peripheral neuropathy    Past Surgical History:  Procedure Laterality Date  .  APPENDECTOMY  ~ 1956  . ATRIAL FLUTTER ABLATION N/A 05/03/2012   Procedure: ATRIAL FLUTTER ABLATION;  Surgeon: Evans Lance, MD;  Location: [facility] CATH LAB;  Service: Cardiovascular;  Laterality: N/A;  . CARDIAC CATHETERIZATION  08/1999; 05/03/2012   normal coronary arteries;   . CARPAL TUNNEL WITH CUBITAL TUNNEL  2004   "right" (05/03/2012)  . COLONOSCOPY  06/06/2008   severe sigmoid diverticulosis and internal hemorrhoids  . ESOPHAGOGASTRODUODENOSCOPY  06/06/2008   normal  . INGUINAL HERNIA REPAIR  03/23/11   "left" (05/03/2012)  . POSTERIOR LUMBAR FUSION  1989  . reconstruction (r) foot surgery     DR SUE   . RENAL CYST EXCISION     pt denies this hx on 05/03/2012  . [place]  . SPINE SURGERY  1969   Pantopaque Myelography and spinal infusion- Dr. Lyman Speller  . TONSILLECTOMY  ~ 1946  . TOTAL HIP ARTHROPLASTY  1999-2000   bilateral   Social History:   reports that he has quit smoking. His smoking use included Cigarettes. He has a 10.00 pack-year smoking history. He has never used smokeless tobacco. He reports that he drinks alcohol. He reports that he does not use drugs.  Family History  Problem Relation Age of Onset  . Hypertension Father   . Colon cancer Neg Hx     Medications: Patient's Medications  New Prescriptions   No medications on file  Previous Medications   ASPIRIN EC 81 MG TABLET    Take 1 tablet (81 mg total) by mouth daily.   CETIRIZINE (ZYRTEC) 10 MG TABLET    Take 1 tablet (10 mg total) by mouth daily.   FINASTERIDE (PROSCAR) 5 MG TABLET    One daily to treat prostate   HYDROXYZINE (ATARAX/VISTARIL) 25 MG TABLET    Take 1 tablet (25 mg total) by mouth every 6 (six) hours.   LOSARTAN-HYDROCHLOROTHIAZIDE (HYZAAR) 100-25 MG TABLET    TAKE 1 TABLET BY MOUTH EVERY DAY TO CONTROL BLOOD PRESSURE   NEBIVOLOL (BYSTOLIC) 5 MG TABLET    Take 1 tablet (5 mg total) by mouth daily.   ONDANSETRON (ZOFRAN) 4 MG TABLET    TAKE 1 TABLET BY MOUTH EVERY 8 HOURS AS NEEDED FOR  NAUSEA   OXYCODONE (ROXICODONE) 30 MG IMMEDIATE RELEASE TABLET    One every 6 hours if needed to control pain  Modified Medications   No medications on file  Discontinued Medications   No medications on file     Physical Exam:  Vitals:   11/22/16 1000  BP: (!) 168/92  Pulse: 65  Resp: 17  Temp: 97.8 F (36.6 C)  TempSrc: Oral  SpO2: 97%  Weight: 272 lb (123.4 kg)  Height: _0  (1.905 m)   Body mass index is 34 kg/m.  Physical Exam  Constitutional: He is oriented to person, place, and time. He appears  well-developed and well-nourished. No distress.  Moderately overweight  HENT:  Head: Normocephalic and atraumatic.  Significant partial deafness bilaterally.  Eyes: Conjunctivae and EOM are normal. Pupils are equal, round, and reactive to light.  Neck: Normal range of motion. Neck supple. No JVD present. No tracheal deviation present. No thyromegaly present.  Cardiovascular: Normal rate, regular rhythm and normal heart sounds.  Exam reveals no gallop and no friction rub.   No murmur heard. Pulmonary/Chest: Effort normal and breath sounds normal. No respiratory distress. He has no wheezes. He has no rales. He exhibits no tenderness.  Abdominal: Soft. Bowel sounds are normal. He exhibits no distension and no mass. There is no tenderness.  Genitourinary: Rectum normal, prostate normal and penis normal. Rectal exam shows guaiac negative stool.  Musculoskeletal: He exhibits no edema or tenderness.  Lymphadenopathy:    He has no cervical adenopathy.  Neurological: He is alert and oriented to person, place, and time. No cranial nerve deficit.  05/18/16 MMSE 29/30. Passed clock drawing.  Skin: No rash noted. No pallor.  Psychiatric: His speech is normal and behavior is normal. Judgment and thought content normal. Cognition and memory are normal. He exhibits a depressed mood.  Anxiety and depressed mood.    Labs reviewed: Basic Metabolic Panel:  Recent Labs  11/25/15 1550  04/02/16 1208 07/23/16 1158  NA  --  139 140  K  --  4.4 4.3  CL  --  105 104  CO2  --  24 30  GLUCOSE  --  86 73  BUN  --  22 18  CREATININE  --  1.28* 1.35*  CALCIUM  --  9.8 9.2  MG  --  1.8  --   TSH 1.520  --   --    Liver Function Tests:  Recent Labs  07/23/16 1158  AST 13  ALT 9  ALKPHOS 67  BILITOT 1.5*  PROT 6.0*  ALBUMIN 3.9   No results for input(s): LIPASE, AMYLASE in the last 8760 hours. No results for input(s): AMMONIA in the last 8760 hours. CBC: No results for input(s): WBC, NEUTROABS, HGB, HCT, MCV, PLT in the last 8760 hours. Lipid Panel:  Recent Labs  04/02/16 1208  CHOL 155  HDL 49  LDLCALC 90  TRIG 80  CHOLHDL 3.2   TSH:  Recent Labs  11/25/15 1550  TSH 1.520   A1C: Lab Results  Component Value Date   HGBA1C (H) 08/10/2010    5.8 (NOTE)                                                                       According to the ADA Clinical Practice Recommendations for 2011, when HbA1c is used as a screening test:   >=6.5%   Diagnostic of Diabetes Mellitus           (if abnormal result  is confirmed)  5.7-6.4%   Increased risk of developing Diabetes Mellitus  References:Diagnosis and Classification of Diabetes Mellitus,Diabetes NFAO,1308,65(HQION 1):S62-S69 and Standards of Medical Care in         Diabetes - 2011,Diabetes GEXB,2841,32  (Suppl 1):S11-S61.     Assessment/Plan 1. Essential hypertension -reports increase in fatigue and weakness on bystolic. Blood pressure has not improved on medication.  -  will stop and start norvasc 5 mg daily -low sodium diet.  - amLODipine (NORVASC) 5 MG tablet; Take 1 tablet (5 mg total) by mouth daily.  Dispense: 30 tablet; Refill: 0  2. Class 1 obesity due to excess calories without serious comorbidity with body mass index (BMI) of 33.0 to 33.9 in adult Pt spent most of the visit asking about weight loss.  -offered nutritional consult but he does not wish to do this because they will only tell him to  "eat lettuce and carrots"  -discussed lifestyle modifications and healthy choices -gave him handouts on heart healthy/dash diet -encouraged him to work up to 30 mins of exercise daily  To follow up in 2 weeks  Jessica K. Harle Battiest  [facility] [phone] 8 am - 5 pm) [phone] (after hours)

## 2016-11-24 ENCOUNTER — Ambulatory Visit (INDEPENDENT_AMBULATORY_CARE_PROVIDER_SITE_OTHER): Payer: Medicare Other | Admitting: Orthopedic Surgery

## 2016-11-26 ENCOUNTER — Ambulatory Visit (INDEPENDENT_AMBULATORY_CARE_PROVIDER_SITE_OTHER): Payer: Medicare Other | Admitting: Podiatry

## 2016-11-26 ENCOUNTER — Other Ambulatory Visit: Payer: Self-pay | Admitting: Nurse Practitioner

## 2016-11-26 ENCOUNTER — Encounter: Payer: Self-pay | Admitting: Podiatry

## 2016-11-26 ENCOUNTER — Other Ambulatory Visit: Payer: Self-pay | Admitting: *Deleted

## 2016-11-26 DIAGNOSIS — M2041 Other hammer toe(s) (acquired), right foot: Secondary | ICD-10-CM | POA: Diagnosis not present

## 2016-11-26 DIAGNOSIS — Q828 Other specified congenital malformations of skin: Secondary | ICD-10-CM | POA: Diagnosis not present

## 2016-11-26 DIAGNOSIS — M75101 Unspecified rotator cuff tear or rupture of right shoulder, not specified as traumatic: Principal | ICD-10-CM

## 2016-11-26 DIAGNOSIS — M12811 Other specific arthropathies, not elsewhere classified, right shoulder: Secondary | ICD-10-CM

## 2016-11-26 MED ORDER — OXYCODONE HCL 30 MG PO TABS: ORAL_TABLET | ORAL | 0 refills | Status: DC

## 2016-11-26 NOTE — Progress Notes (Signed)
Subjective:    Patient ID: [name] [name]., male   DOB: 76 y.o.   MRN: [id number]   HPI patient has a painful third digit right with distal keratotic lesion that's hard for the patient to walk with    ROS      Objective:  Physical Exam neurovascular status intact with distal keratotic lesion third right that's localized in nature with digital deformity noted     Assessment:   Hammertoe deformity digits 3 right with keratotic lesion formation      Plan:    Debridement of lesion third digit right with application of buttress pad to lift the toe. Patient will be seen back to recheck and I did review with him hammertoe repair which could be done in future

## 2016-11-26 NOTE — Telephone Encounter (Signed)
Patient requested and will pick up 

## 2016-11-26 NOTE — Telephone Encounter (Signed)
I called to question reason for request, patient states he uses this medication for mouth sores. Patient will mouth sores off/on, this episode onset 2 weeks ago  Please advise

## 2016-12-06 ENCOUNTER — Ambulatory Visit: Payer: Medicare Other | Admitting: Nurse Practitioner

## 2016-12-09 ENCOUNTER — Encounter: Payer: Self-pay | Admitting: Internal Medicine

## 2016-12-09 ENCOUNTER — Ambulatory Visit (INDEPENDENT_AMBULATORY_CARE_PROVIDER_SITE_OTHER): Payer: Medicare Other | Admitting: Internal Medicine

## 2016-12-09 ENCOUNTER — Ambulatory Visit: Payer: Medicare Other | Admitting: Internal Medicine

## 2016-12-09 ENCOUNTER — Ambulatory Visit (INDEPENDENT_AMBULATORY_CARE_PROVIDER_SITE_OTHER): Payer: Medicare Other | Admitting: Orthopedic Surgery

## 2016-12-09 VITALS — BP 148/70 | HR 72 | Temp 98.4°F | Wt 273.0 lb

## 2016-12-09 DIAGNOSIS — M7041 Prepatellar bursitis, right knee: Secondary | ICD-10-CM | POA: Diagnosis not present

## 2016-12-09 DIAGNOSIS — Z6833 Body mass index (BMI) 33.0-33.9, adult: Secondary | ICD-10-CM | POA: Diagnosis not present

## 2016-12-09 DIAGNOSIS — E6609 Other obesity due to excess calories: Secondary | ICD-10-CM | POA: Diagnosis not present

## 2016-12-09 DIAGNOSIS — F331 Major depressive disorder, recurrent, moderate: Secondary | ICD-10-CM

## 2016-12-09 DIAGNOSIS — G4700 Insomnia, unspecified: Secondary | ICD-10-CM | POA: Diagnosis not present

## 2016-12-09 DIAGNOSIS — I10 Essential (primary) hypertension: Secondary | ICD-10-CM

## 2016-12-09 DIAGNOSIS — M7042 Prepatellar bursitis, left knee: Secondary | ICD-10-CM

## 2016-12-09 DIAGNOSIS — R457 State of emotional shock and stress, unspecified: Secondary | ICD-10-CM

## 2016-12-09 DIAGNOSIS — J301 Allergic rhinitis due to pollen: Secondary | ICD-10-CM

## 2016-12-09 NOTE — Patient Instructions (Addendum)
Chamomile tea at bedtime or melatonin if the tea is not effective.    Start off walking on a treadmill for 10 mins or stationary bike.    Suspect bursitis in your knees.    Fat and Cholesterol Restricted Diet High levels of fat and cholesterol in your blood may lead to various health problems, such as diseases of the heart, blood vessels, gallbladder, liver, and pancreas. Fats are concentrated sources of energy that come in various forms. Certain types of fat, including saturated fat, may be harmful in excess. Cholesterol is a substance needed by your body in small amounts. Your body makes all the cholesterol it needs. Excess cholesterol comes from the food you eat. When you have high levels of cholesterol and saturated fat in your blood, health problems can develop because the excess fat and cholesterol will gather along the walls of your blood vessels, causing them to narrow. Choosing the right foods will help you control your intake of fat and cholesterol. This will help keep the levels of these substances in your blood within normal limits and reduce your risk of disease. What is my plan? Your health care provider recommends that you:  Limit your fat intake to ______% or less of your total calories per day.  Limit the amount of cholesterol in your diet to less than _________mg per day.  Eat 20-30 grams of fiber each day.  What types of fat should I choose?  Choose healthy fats more often. Choose monounsaturated and polyunsaturated fats, such as olive and canola oil, flaxseeds, walnuts, almonds, and seeds.  Eat more omega-3 fats. Good choices include salmon, mackerel, sardines, tuna, flaxseed oil, and ground flaxseeds. Aim to eat fish at least two times a week.  Limit saturated fats. Saturated fats are primarily found in animal products, such as meats, butter, and cream. Plant sources of saturated fats include palm oil, palm kernel oil, and coconut oil.  Avoid foods with partially  hydrogenated oils in them. These contain trans fats. Examples of foods that contain trans fats are stick margarine, some tub margarines, cookies, crackers, and other baked goods. What general guidelines do I need to follow? These guidelines for healthy eating will help you control your intake of fat and cholesterol:  Check food labels carefully to identify foods with trans fats or high amounts of saturated fat.  Fill one half of your plate with vegetables and green salads.  Fill one fourth of your plate with whole grains. Look for the word "whole" as the first word in the ingredient list.  Fill one fourth of your plate with lean protein foods.  Limit fruit to two servings a day. Choose fruit instead of juice.  Eat more foods that contain fiber, such as apples, broccoli, carrots, beans, peas, and barley.  Eat more home-cooked food and less restaurant, buffet, and fast food.  Limit or avoid alcohol.  Limit foods high in starch and sugar.  Limit fried foods.  Cook foods using methods other than frying. Baking, boiling, grilling, and broiling are all great options.  Lose weight if you are overweight. Losing just 5-10% of your initial body weight can help your overall health and prevent diseases such as diabetes and heart disease.  What foods can I eat? Grains  Whole grains, such as whole wheat or whole grain breads, crackers, cereals, and pasta. Unsweetened oatmeal, bulgur, barley, quinoa, or brown rice. Corn or whole wheat flour tortillas. Vegetables  Fresh or frozen vegetables (raw, steamed, roasted, or grilled). [place]  salads. Fruits  All fresh, canned (in natural juice), or frozen fruits. Meats and other protein foods  Ground beef (85% or leaner), grass-fed beef, or beef trimmed of fat. Skinless chicken or [place]. Ground chicken or [place]. Pork trimmed of fat. All fish and seafood. Eggs. Dried beans, peas, or lentils. Unsalted nuts or seeds. Unsalted canned or dry  beans. Dairy  Low-fat dairy products, such as skim or 1% milk, 2% or reduced-fat cheeses, low-fat ricotta or cottage cheese, or plain low-fat yo Fats and oils  Tub margarines without trans fats. Light or reduced-fat mayonnaise and salad dressings. Avocado. Olive, canola, sesame, or safflower oils. Natural peanut or almond butter (choose ones without added sugar and oil). The items listed above may not be a complete list of recommended foods or beverages. Contact your dietitian for more options. Foods to avoid Grains  White bread. White pasta. White rice. Cornbread. Bagels, pastries, and croissants. Crackers that contain trans fat. Vegetables  White potatoes. Corn. Creamed or fried vegetables. Vegetables in a cheese sauce. Fruits  Dried fruits. Canned fruit in light or heavy syrup. Fruit juice. Meats and other protein foods  Fatty cuts of meat. Ribs, chicken wings, bacon, sausage, bologna, salami, chitterlings, fatback, hot dogs, bratwurst, and packaged luncheon meats. Liver and organ meats. Dairy  Whole or 2% milk, cream, half-and-half, and cream cheese. Whole milk cheeses. Whole-fat or sweetened yogurt. Full-fat cheeses. Nondairy creamers and whipped toppings. Processed cheese, cheese spreads, or cheese curds. Beverages  Alcohol. Sweetened drinks (such as sodas, lemonade, and fruit drinks or punches). Fats and oils  Butter, stick margarine, lard, shortening, ghee, or bacon fat. Coconut, palm kernel, or palm oils. Sweets and desserts  Corn syrup, sugars, honey, and molasses. Candy. Jam and jelly. Syrup. Sweetened cereals. Cookies, pies, cakes, donuts, muffins, and ice cream. The items listed above may not be a complete list of foods and beverages to avoid. Contact your dietitian for more information. This information is not intended to replace advice given to you by your health care provider. Make sure you discuss any questions you have with your health care provider. Document  Released: 05/17/2005 Document Revised: 06/07/2014 Document Reviewed: 08/15/2013 Elsevier Interactive Patient Education  2017 [employer].

## 2016-12-09 NOTE — Progress Notes (Signed)
Location:  [facility] clinic Provider:  Tanganyika Bowlds L. Mariea Clonts, D.O., C.M.D.  Code Status: DNR Goals of Care:  Advanced Directives 12/09/2016  Does Patient Have a Medical Advance Directive? Yes  Type of Advance Directive [place]  Does patient want to make changes to medical advance directive? -  Copy of [place] in Chart? Yes  Would patient like information on creating a medical advance directive? -   Chief Complaint  Patient presents with  . Medical Management of Chronic Issues    BP follow-up, bilateral knee pain, not sleeping well    HPI: Patient is a 76 y.o. male seen today for medical management of chronic diseases.    Reports his arthritis pain and shoulder pain cannot be controlled with decreased pain med frequency.  He's been told by 4 orthos that his right shoulder rotator cuff cannot be repaired.  He was on oxycodone 54m IR every 4 hrs as needed for pain, but it was changed to every 6 hrs.    BP is much improved.  He's had no side effects from his new bp med--on hyzaar, amlodipine.    He's not doing too well on losing weight or destressing his life.    Allergies:  Zyrtec working well.  Thinks some of it is due to having a cat.  He adopted the cat as a companion for his wife which has been a success.  He sneezes from it.  Has seasonal allergies, too, for several months.  Not sleeping.  Can think of one night in a year that he has slept soundly.  Wakes up every 3 hrs through the night.  Had been using zquil which he didn't want to get hooked on.  Wonders about taking valium for it--knows it's bad stuff.  His wife goes to bed around 10:30pm and she sleeps much of the day.  He's a night person but has to go to be when she does.  Sometimes gets up and watches tv and eats a snack.  Admits to nodding off in the afternoons.    Wants to know if he's healthy enough to begin working out at a health club.  Discussed starting slowly 10 mins at first on  treadmill.    Has an appt with Dr. DMarlou Safor knee pain. Is tender in rubbery area beneath patellas as stands.    Struggles with his diet--only drinks on occasion, nothing regular.  Makes poor food choices. Is stressed out about his wife's condition--she gets very dizzy and falls into chair.  She's given up hope.  She's in unrelenting pain even with oxycodone.    Past Medical History:  Diagnosis Date  . Allergic rhinitis due to pollen   . Allergy   . Anxiety state, unspecified   . Arthritis    "right shoulder" (05/03/2012)  . Atrial flutter ([place]    a. dx after inguinal hernia repair in 10/12 => seen by Dr. NAcie Fredrickson . Calculus of kidney   . Cervicalgia   . Cervicalgia   . Clostridium difficile colitis   . Complications affecting other specified body systems, hypertension   . Coronary atherosclerosis of unspecified type of vessel, native or graft   . Degeneration of cervical intervertebral disc   . Depression   . Depressive disorder, not elsewhere classified   . GERD (gastroesophageal reflux disease)   . HTN (hypertension)   . Hx of echocardiogram    a. Echo 11/12:  Mod LVH, EF 55-60%, MAC, mod LAE,  mild RAE  . Impotence of organic origin   . Inguinal hernia without mention of obstruction or gangrene, unilateral or unspecified, (not specified as recurrent)   . Insomnia, unspecified   . Intestinal infection due to Clostridium difficile   . Irritable bowel syndrome   . Kidney stone    "they just passed" (05/03/2012)  . Lumbago   . Nonspecific abnormal electrocardiogram (ECG) (EKG)   . Nonspecific abnormal results of pulmonary system function study   . Obesity, unspecified   . Osteoarthrosis, unspecified whether generalized or localized, unspecified site   . Other malaise and fatigue   . Other specified cardiac dysrhythmias(427.89)   . Pain in joint, ankle and foot   . Pain in joint, lower leg   . Pain in joint, pelvic region and thigh   . Pain in joint, shoulder region   .  Partial deafness   . Rash and other nonspecific skin eruption   . Sacroiliitis, not elsewhere classified ([place])   . Sacroiliitis, not elsewhere classified ([place])   . Spasm of muscle   . Special screening for malignant neoplasm of prostate   . Unspecified constipation   . Unspecified hereditary and idiopathic peripheral neuropathy     Past Surgical History:  Procedure Laterality Date  . APPENDECTOMY  ~ 1956  . ATRIAL FLUTTER ABLATION N/A 05/03/2012   Procedure: ATRIAL FLUTTER ABLATION;  Surgeon: Evans Lance, MD;  Location: [facility] CATH LAB;  Service: Cardiovascular;  Laterality: N/A;  . CARDIAC CATHETERIZATION  08/1999; 05/03/2012   normal coronary arteries;   . CARPAL TUNNEL WITH CUBITAL TUNNEL  2004   "right" (05/03/2012)  . COLONOSCOPY  06/06/2008   severe sigmoid diverticulosis and internal hemorrhoids  . ESOPHAGOGASTRODUODENOSCOPY  06/06/2008   normal  . INGUINAL HERNIA REPAIR  03/23/11   "left" (05/03/2012)  . POSTERIOR LUMBAR FUSION  1989  . reconstruction (r) foot surgery     DR SUE   . RENAL CYST EXCISION     pt denies this hx on 05/03/2012  . [place]  . SPINE SURGERY  1969   Pantopaque Myelography and spinal infusion- Dr. Lyman Speller  . TONSILLECTOMY  ~ 1946  . TOTAL HIP ARTHROPLASTY  1999-2000   bilateral    Allergies  Allergen Reactions  . Amitriptyline   . Azithromycin     Stomach pain  . Flexeril [Cyclobenzaprine]     Causes dizziness    Allergies as of 12/09/2016      Reactions   Amitriptyline    Azithromycin    Stomach pain   Flexeril [cyclobenzaprine]    Causes dizziness      Medication List       Accurate as of 12/09/16 12:29 PM. Always use your most recent med list.          amLODipine 5 MG tablet Commonly known as:  NORVASC Take 1 tablet (5 mg total) by mouth daily.   aspirin EC 81 MG tablet Take 1 tablet (81 mg total) by mouth daily.   cetirizine 10 MG tablet Commonly known as:  ZYRTEC Take 1 tablet (10 mg total) by mouth daily.     finasteride 5 MG tablet Commonly known as:  PROSCAR One daily to treat prostate   hydrOXYzine 25 MG tablet Commonly known as:  ATARAX/VISTARIL Take 1 tablet (25 mg total) by mouth every 6 (six) hours.   losartan-hydrochlorothiazide 100-25 MG tablet Commonly known as:  HYZAAR TAKE 1 TABLET BY MOUTH EVERY DAY TO CONTROL BLOOD  PRESSURE   nystatin 100000 UNIT/ML suspension Commonly known as:  MYCOSTATIN SWISH AND SPIT WITH 5 MILLILITERS BY MOUTH 4 TIMES A DAY   ondansetron 4 MG tablet Commonly known as:  ZOFRAN TAKE 1 TABLET BY MOUTH EVERY 8 HOURS AS NEEDED FOR NAUSEA   oxycodone 30 MG immediate release tablet Commonly known as:  ROXICODONE One every 6 hours if needed to control pain       Review of Systems:  Review of Systems  Constitutional: Negative for chills, fever and malaise/fatigue.       Of thigh muscles  HENT: Positive for hearing loss. Negative for congestion.        Nasal congestion improved with zyrtec  Eyes: Negative for blurred vision.  Respiratory: Negative for cough and shortness of breath.   Cardiovascular: Negative for chest pain, palpitations and leg swelling.  Gastrointestinal: Negative for abdominal pain.  Genitourinary: Positive for frequency and urgency. Negative for dysuria.       Improvement since proscar  Musculoskeletal: Positive for joint pain. Negative for falls.       Bilateral knees just beneath knee caps, right shoulder torn rotator cuff  Skin: Negative for itching and rash.  Neurological: Positive for weakness. Negative for dizziness and loss of consciousness.  Psychiatric/Behavioral: Positive for depression. Negative for memory loss. The patient has insomnia.        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 INFLUENZA VACCINE  12/29/2016  . TETANUS/TDAP  06/01/2021  . PNA vac Low Risk Adult   Completed    Physical Exam: Vitals:   12/09/16 1140  BP: (!) 148/70  Pulse: 72  Temp: 98.4 F (36.9 C)  TempSrc: Oral  SpO2: 98%  Weight: 273 lb (123.8 kg)   Body mass index is 34.12 kg/m. Physical Exam  Constitutional: He is oriented to person, place, and time. He appears well-developed and well-nourished. No distress.  HENT:  Head: Normocephalic and atraumatic.  Cardiovascular: Normal rate, regular rhythm, normal heart sounds and intact distal pulses.   Pulmonary/Chest: Effort normal and breath sounds normal. No respiratory distress.  Abdominal:  Abdominal obesity  Musculoskeletal: He exhibits tenderness.  Right shoulder, bilateral knees just distal to patellae; walks with stooped posture, bow legged with decreased muscle mass in legs  Neurological: He is alert and oriented to person, place, and time.  Skin: Skin is warm and dry.  Psychiatric:  Seems depressed    Labs reviewed: Basic Metabolic Panel:  Recent Labs  04/02/16 1208 07/23/16 1158  NA 139 140  K 4.4 4.3  CL 105 104  CO2 24 30  GLUCOSE 86 73  BUN 22 18  CREATININE 1.28* 1.35*  CALCIUM 9.8 9.2  MG 1.8  --    Liver Function Tests:  Recent Labs  07/23/16 1158  AST 13  ALT 9  ALKPHOS 67  BILITOT 1.5*  PROT 6.0*  ALBUMIN 3.9   No results for input(s): LIPASE, AMYLASE in the last 8760 hours. No results for input(s): AMMONIA in the last 8760 hours. CBC: No results for input(s): WBC, NEUTROABS, HGB, HCT, MCV, PLT in the last 8760 hours. Lipid Panel:  Recent Labs  04/02/16 1208  CHOL 155  HDL 49  LDLCALC 90  TRIG 80  CHOLHDL 3.2   Lab Results  Component Value Date   HGBA1C (H) 08/10/2010    5.8 (  NOTE)                                                                       According to the ADA Clinical Practice Recommendations for 2011, when HbA1c is used as a screening test:   >=6.5%   Diagnostic of Diabetes Mellitus           (if abnormal result  is confirmed)  5.7-6.4%   Increased  risk of developing Diabetes Mellitus  References:Diagnosis and Classification of Diabetes Mellitus,Diabetes UQJF,3545,62(BWLSL 1):S62-S69 and Standards of Medical Care in         Diabetes - 2011,Diabetes HTDS,2876,81  (Suppl 1):S11-S61.    Assessment/Plan 1. Essential hypertension -bp improved, I may adjust meds again if over 140 again next time, but he was opposed today and says he's going to get going on an exercise program  2. Insomnia, unspecified type -agrees to try chamomile tea, warm milk grossed him out, and refuses meds which I don't blame him for though I would consider trazodone for him b/c it may also help his spirits and caregiver stress  3. Class 1 obesity due to excess calories without serious comorbidity with body mass index (BMI) of 33.0 to 33.9 in adult -is to begin a 5 day per week 10 minute walking on treadmill or cycling regimen   4. Prepatellar bursitis of both knees -my suspected diagnosis, no fluid to aspirate at present, to see Dr. Marlou Sa for possible injections  5. Caregiver stress syndrome -wife is apparently quite ill and in chronic pain, not very active or mobile so this limits his life, too, reports they are no longer sexually active and he has ED (discussed weight loss as primary intervention for him there) -would favor treating with a weight neutral antidepressant and avoiding benzos (he's on high dose oxycodone for shoulder pain from rotator cuff tear so these would depress respirations deeming them unsafe) -he is depressed, added dx to [facility] problem list--need PHQ9 next time  6. Seasonal allergic rhinitis due to pollen -cont zyrtec which has been beneficial -also reports some possible cat allergies, but cat is helpful for his wife  Labs/tests ordered:  No orders of the defined types were placed in this encounter.  Next appt:  12/30/2016   Jarah Pember L. Shley Dolby, D.O. [facility] [address], [place]  [address] Cell Phone (Mon-Fri 8am-5pm):  [phone] On Call:  [phone] & follow prompts after 5pm & weekends Office Phone:  [phone] Office Fax:  [phone]

## 2016-12-11 ENCOUNTER — Encounter: Payer: Self-pay | Admitting: Internal Medicine

## 2016-12-13 ENCOUNTER — Encounter (INDEPENDENT_AMBULATORY_CARE_PROVIDER_SITE_OTHER): Payer: Self-pay | Admitting: Orthopedic Surgery

## 2016-12-13 ENCOUNTER — Ambulatory Visit (INDEPENDENT_AMBULATORY_CARE_PROVIDER_SITE_OTHER): Payer: Medicare Other | Admitting: Orthopedic Surgery

## 2016-12-13 DIAGNOSIS — M25562 Pain in left knee: Secondary | ICD-10-CM

## 2016-12-13 DIAGNOSIS — M25561 Pain in right knee: Secondary | ICD-10-CM

## 2016-12-13 DIAGNOSIS — G8929 Other chronic pain: Secondary | ICD-10-CM | POA: Diagnosis not present

## 2016-12-13 MED ORDER — BUPIVACAINE HCL 0.25 % IJ SOLN
4.0000 mL | INTRAMUSCULAR | Status: AC | PRN
  Administered 2016-12-13: 4 mL via INTRA_ARTICULAR

## 2016-12-13 MED ORDER — METHYLPREDNISOLONE ACETATE 40 MG/ML IJ SUSP
40.0000 mg | INTRAMUSCULAR | Status: AC | PRN
  Administered 2016-12-13: 40 mg via INTRA_ARTICULAR

## 2016-12-13 MED ORDER — LIDOCAINE HCL 1 % IJ SOLN
5.0000 mL | INTRAMUSCULAR | Status: AC | PRN
  Administered 2016-12-13: 5 mL

## 2016-12-13 NOTE — Progress Notes (Signed)
Office Visit Note   Patient: [name] [name].           Date of Birth: December 23, 1940           MRN: [id number] Visit Date: 12/13/2016 Requested by: Lauree Chandler, NP [place], [place] [address] PCP: Gayland Curry, DO  Subjective: Chief Complaint  Patient presents with  . Right Knee - Pain  . Left Knee - Pain    HPI: [name] is a 76 year old patient with bilateral knee pain.  He has had right knee open meniscectomy in the past.  He states that currently one is not worse than the other.  He is hard for him to get up from sitting to standing but walking is okay.  He describes pain 1 out of 10 with walking.  Standing also okay.  Stairs are difficult for him.  He is not taking any medication for the problem.  No waking from sleep at night with pain.  Primary care provider has advised him about weight loss.  He is working part-time as a Designer, television/film set.                ROS: All systems reviewed are negative as they relate to the chief complaint within the history of present illness.  Patient denies  fevers or chills.   Assessment & Plan: Visit Diagnoses:  1. Chronic pain of both knees     Plan: Impression is worsening bilateral knee pain with history of good response to injections done in September 2017.  These are cortisone injections.  Plan is to repeat both injections today.  Weight loss and quad strengthening encouraged in a nonweightbearing fashion.  Discussed with him at length today the requirements for that.  I will see him back as needed.  I think Tylenol arthritis would be the best option for him for pain relief.  Follow-Up Instructions: Return if symptoms worsen or fail to improve.   Orders:  No orders of the defined types were placed in this encounter.  No orders of the defined types were placed in this encounter.     Procedures: Large Joint Inj Date/Time: 12/13/2016 2:18 PM Performed by: Meredith Pel Authorized by: Meredith Pel    Consent Given by:  Patient Site marked: the procedure site was marked   Timeout: prior to procedure the correct patient, procedure, and site was verified   Indications:  Pain, joint swelling and diagnostic evaluation Location:  Knee Site:  R knee Prep: patient was prepped and draped in usual sterile fashion   Needle Size:  18 G Needle Length:  1.5 inches Approach:  Superolateral Ultrasound Guidance: No   Fluoroscopic Guidance: No   Arthrogram: No   Medications:  5 mL lidocaine 1 %; 4 mL bupivacaine 0.25 %; 40 mg methylPREDNISolone acetate 40 MG/ML Patient tolerance:  Patient tolerated the procedure well with no immediate complications Large Joint Inj Date/Time: 12/13/2016 2:18 PM Performed by: Meredith Pel Authorized by: Meredith Pel   Consent Given by:  Patient Site marked: the procedure site was marked   Timeout: prior to procedure the correct patient, procedure, and site was verified   Indications:  Pain, joint swelling and diagnostic evaluation Location:  Knee Site:  L knee Prep: patient was prepped and draped in usual sterile fashion   Needle Size:  18 G Needle Length:  1.5 inches Approach:  Superolateral Ultrasound Guidance: No   Fluoroscopic Guidance: No   Arthrogram: No  Medications:  5 mL lidocaine 1 %; 4 mL bupivacaine 0.25 %; 40 mg methylPREDNISolone acetate 40 MG/ML Patient tolerance:  Patient tolerated the procedure well with no immediate complications     Clinical Data: No additional findings.  Objective: Vital Signs: There were no vitals taken for this visit.  Physical Exam:   Constitutional: Patient appears well-developed HEENT:  Head: Normocephalic Eyes:EOM are normal Neck: Normal range of motion Cardiovascular: Normal rate Pulmonary/chest: Effort normal Neurologic: Patient is alert Skin: Skin is warm Psychiatric: Patient has normal mood and affect    Ortho Exam:  orthopedic exam demonstrates full active and passive range  of motion of the ankles and hips.  He has about a 5-7 flexion contracture in both knees.  No real effusion is present.  Collateral crucial ligaments are stable.  Mild patellofemoral crepitus is present.  Does localize most of his pain to the anteromedial aspect of the joint line in both knees.  Specialty Comments:  No specialty comments available.  Imaging: No results found.   PMFS History: Patient Active Problem List   Diagnosis Date Noted  . Seborrheic keratosis 08/17/2016  . Sprain of ankle 05/12/2016  . Rash and nonspecific skin eruption 01/06/2016  . Pain in both knees 11/25/2015  . Myalgia and myositis 11/25/2015  . Weak 11/25/2015  . Insomnia 09/03/2015  . Frequency of urination 05/27/2015  . Post herpetic Neuropathy 03/12/2015  . Depression, major, recurrent, moderate ([place]) 03/12/2015  . Stress at home 01/26/2015  . Pain in left foot 06/26/2014  . Corn of toe 01/09/2014  . Allergic rhinitis 10/01/2013  . Rotator cuff tear arthropathy of right shoulder 10/01/2013  . Pain in joint, shoulder region 01/02/2013  . HTN (hypertension)   . Partial deafness   . Obesity   . Hammertoe 10/04/2012  . CKD (chronic kidney disease) 03/22/2012  . Chronic constipation 11/13/2010   Past Medical History:  Diagnosis Date  . Allergic rhinitis due to pollen   . Allergy   . Anxiety state, unspecified   . Arthritis    "right shoulder" (05/03/2012)  . Atrial flutter ([place])    a. dx after inguinal hernia repair in 10/12 => seen by Dr. Acie Fredrickson  . Calculus of kidney   . Cervicalgia   . Cervicalgia   . Clostridium difficile colitis   . Complications affecting other specified body systems, hypertension   . Coronary atherosclerosis of unspecified type of vessel, native or graft   . Degeneration of cervical intervertebral disc   . Depression   . Depressive disorder, not elsewhere classified   . GERD (gastroesophageal reflux disease)   . HTN (hypertension)   . Hx of echocardiogram    a.  Echo 11/12:  Mod LVH, EF 55-60%, MAC, mod LAE, mild RAE  . Impotence of organic origin   . Inguinal hernia without mention of obstruction or gangrene, unilateral or unspecified, (not specified as recurrent)   . Insomnia, unspecified   . Intestinal infection due to Clostridium difficile   . Irritable bowel syndrome   . Kidney stone    "they just passed" (05/03/2012)  . Lumbago   . Nonspecific abnormal electrocardiogram (ECG) (EKG)   . Nonspecific abnormal results of pulmonary system function study   . Obesity, unspecified   . Osteoarthrosis, unspecified whether generalized or localized, unspecified site   . Other malaise and fatigue   . Other specified cardiac dysrhythmias(427.89)   . Pain in joint, ankle and foot   . Pain in joint, lower leg   .  Pain in joint, pelvic region and thigh   . Pain in joint, shoulder region   . Partial deafness   . Rash and other nonspecific skin eruption   . Sacroiliitis, not elsewhere classified ([place])   . Sacroiliitis, not elsewhere classified ([place])   . Spasm of muscle   . Special screening for malignant neoplasm of prostate   . Unspecified constipation   . Unspecified hereditary and idiopathic peripheral neuropathy     Family History  Problem Relation Age of Onset  . Hypertension Father   . Colon cancer Neg Hx     Past Surgical History:  Procedure Laterality Date  . APPENDECTOMY  ~ 1956  . ATRIAL FLUTTER ABLATION N/A 05/03/2012   Procedure: ATRIAL FLUTTER ABLATION;  Surgeon: Evans Lance, MD;  Location: [facility] CATH LAB;  Service: Cardiovascular;  Laterality: N/A;  . CARDIAC CATHETERIZATION  08/1999; 05/03/2012   normal coronary arteries;   . CARPAL TUNNEL WITH CUBITAL TUNNEL  2004   "right" (05/03/2012)  . COLONOSCOPY  06/06/2008   severe sigmoid diverticulosis and internal hemorrhoids  . ESOPHAGOGASTRODUODENOSCOPY  06/06/2008   normal  . INGUINAL HERNIA REPAIR  03/23/11   "left" (05/03/2012)  . POSTERIOR LUMBAR FUSION  1989  . reconstruction (r)  foot surgery     DR SUE   . RENAL CYST EXCISION     pt denies this hx on 05/03/2012  . [place]  . SPINE SURGERY  1969   Pantopaque Myelography and spinal infusion- Dr. Lyman Speller  . TONSILLECTOMY  ~ 1946  . TOTAL HIP ARTHROPLASTY  1999-2000   bilateral   Social History   Occupational History  . Not on file.   Social History Main Topics  . Smoking status: Former Smoker    Packs/day: 2.00    Years: 5.00    Types: Cigarettes  . Smokeless tobacco: Never Used     Comment: 05/03/2012 "smoked from age 15 to 1"  . Alcohol use 0.0 oz/week  . Drug use: No  . Sexual activity: Yes    Partners: Female

## 2016-12-14 ENCOUNTER — Encounter: Payer: Self-pay | Admitting: Internal Medicine

## 2016-12-14 ENCOUNTER — Encounter (INDEPENDENT_AMBULATORY_CARE_PROVIDER_SITE_OTHER): Payer: Self-pay | Admitting: Orthopedic Surgery

## 2016-12-15 ENCOUNTER — Ambulatory Visit: Payer: Medicare Other | Admitting: Internal Medicine

## 2016-12-20 ENCOUNTER — Other Ambulatory Visit: Payer: Self-pay | Admitting: Nurse Practitioner

## 2016-12-20 DIAGNOSIS — I10 Essential (primary) hypertension: Secondary | ICD-10-CM

## 2016-12-23 ENCOUNTER — Telehealth: Payer: Self-pay | Admitting: *Deleted

## 2016-12-23 NOTE — Telephone Encounter (Signed)
Patient stated that he is not sure why a 3rd Bp medication was added for him to take. Stated that he is not going to take it until he comes and sees you on 12/30/16. Stated he didn't have much confidence since Dr. Rolly Salter left. Tried to explain to him that his BP was up at last visit, still wants to discuss with Dr. Mariea Clonts at next appointment.  FYI

## 2016-12-23 NOTE — Telephone Encounter (Signed)
Noted.  I explained to him why I chose to add another medication at the appt.  It is, as you said, b/c his blood pressure was still high.   He is at risk for a stroke or heart attack if his blood pressure continues to run over 140/90.

## 2016-12-24 ENCOUNTER — Other Ambulatory Visit: Payer: Self-pay | Admitting: *Deleted

## 2016-12-24 DIAGNOSIS — M12811 Other specific arthropathies, not elsewhere classified, right shoulder: Secondary | ICD-10-CM

## 2016-12-24 DIAGNOSIS — M75101 Unspecified rotator cuff tear or rupture of right shoulder, not specified as traumatic: Principal | ICD-10-CM

## 2016-12-24 MED ORDER — OXYCODONE HCL 30 MG PO TABS: ORAL_TABLET | ORAL | 0 refills | Status: DC

## 2016-12-24 NOTE — Telephone Encounter (Signed)
Patient requested and will pick up 

## 2016-12-30 ENCOUNTER — Ambulatory Visit: Payer: Medicare Other | Admitting: Internal Medicine

## 2016-12-31 ENCOUNTER — Encounter: Payer: Self-pay | Admitting: Internal Medicine

## 2017-01-06 ENCOUNTER — Encounter: Payer: Self-pay | Admitting: Internal Medicine

## 2017-01-06 ENCOUNTER — Encounter: Payer: Self-pay | Admitting: Podiatry

## 2017-01-11 ENCOUNTER — Encounter: Payer: Self-pay | Admitting: Internal Medicine

## 2017-01-12 ENCOUNTER — Encounter: Payer: Self-pay | Admitting: Internal Medicine

## 2017-01-20 ENCOUNTER — Encounter: Payer: Self-pay | Admitting: Internal Medicine

## 2017-01-20 ENCOUNTER — Ambulatory Visit (INDEPENDENT_AMBULATORY_CARE_PROVIDER_SITE_OTHER): Payer: Medicare Other | Admitting: Internal Medicine

## 2017-01-20 VITALS — BP 130/80 | HR 73 | Temp 98.0°F

## 2017-01-20 DIAGNOSIS — Z6833 Body mass index (BMI) 33.0-33.9, adult: Secondary | ICD-10-CM

## 2017-01-20 DIAGNOSIS — I10 Essential (primary) hypertension: Secondary | ICD-10-CM

## 2017-01-20 DIAGNOSIS — G4700 Insomnia, unspecified: Secondary | ICD-10-CM

## 2017-01-20 DIAGNOSIS — Z23 Encounter for immunization: Secondary | ICD-10-CM

## 2017-01-20 DIAGNOSIS — R457 State of emotional shock and stress, unspecified: Secondary | ICD-10-CM

## 2017-01-20 DIAGNOSIS — E6609 Other obesity due to excess calories: Secondary | ICD-10-CM | POA: Diagnosis not present

## 2017-01-20 MED ORDER — ZOSTER VAC RECOMB ADJUVANTED 50 MCG/0.5ML IM SUSR: 0.5000 mL | Freq: Once | INTRAMUSCULAR | 1 refills | Status: AC

## 2017-01-20 NOTE — Progress Notes (Signed)
Provider:  Rexene Edison. Mariea Clonts, D.O., C.M.D. Location:   [facility]  Place of Service:   clinic  Previous PCP: Gayland Curry, DO Patient Care Team: Gayland Curry, DO as PCP - General (Geriatric Medicine)  Extended Emergency Contact Information Primary Emergency Contact: Moradi,Brenda Address: [address] [place], [place] [address] [place] Phone: [phone] Mobile Phone: [phone] Relation: Spouse  Code Status: DNR Goals of Care: Advanced Directive information Advanced Directives 01/20/2017  Does Patient Have a Medical Advance Directive? Yes  Type of Advance Directive [place]  Does patient want to make changes to medical advance directive? -  Copy of [place] in Chart? Yes  Would patient like information on creating a medical advance directive? -   Chief Complaint  Patient presents with  . Medical Management of Chronic Issues    wants to delay physical--refused to have it today despite scheduling    HPI: Patient is a 76 y.o. male seen today for an annual physical exam.  He is following up also on his weight management.  He has started a new lifestyle 17 days ago.  He's going to the gym 4 days a week.  2 days ago, he overdid the weight and his back got sore.  Heat helped.  He's eating less than 1000 calories per day and taking multivitamins.  He says he will change that.  He says it is very much mental.  He went to 330 to 209 at one point.  After that he went back to his old lifestyle.  Weighed when he started and then 8 days ago and had lost.  Weighing every 2 weeks so he'll see more than just 1-2 lbs.  He's also drinking more water in the last month than in his life.  He's drinking orange perrier.  He's skipping breakfast.  Wants to see a big success Sept 1st.  He can tell a difference in his clothes--he lost 10 lbs the first two weeks.  2-2.5 per week is his goal.  Then he'd be pretty happy with his goal in Dec  if that's consistent.  Avoiding sugars, colas, very few carbs.  He's grilling chicken, green beans, small sweet potato.  [employer] 1/2 6 in [place] sub.  Drinking water water water.  Taking 1000 mg vitamin C.  Says he's easily influenced by food.  Asks about a [place] sandwich--discussed with whole grain, veggies on it, no bacon.  Talking about doing less weights and more exercise with cardio.  Using the exercise bike, but his rearend is getting sore.  Discussed bike shorts beneath regular shorts.   Next weigh is Saturday.  Knows he's not getting enough fresh fruits and veggies.    HTN:  bp is better with exercise and better diet plus his meds.    Talks about his right shoulder pain and how "4 different surgeons in [place]" have said he could not have a rotator cuff repair.  Oxycodone has it well-controlled.    Asks about his colonoscopy.  He had no polyps.    Also asks about a prostate exam and prostate tests.  Does sometimes get a little dribble after he thinks he is done urinating.  Then refuses PSA or rectal exam at this time.  Since drinking the water, his constipation is nearly resolved.  Only going every couple of days.    Got his flu shot this am here.  Taking a zquil type sleeping pill.  Working well.  Not having hangover effect.  Feels pretty well refreshed.    Past Medical History:  Diagnosis Date  . Allergic rhinitis due to pollen   . Allergy   . Anxiety state, unspecified   . Arthritis    "right shoulder" (05/03/2012)  . Atrial flutter ([place])    a. dx after inguinal hernia repair in 10/12 => seen by Dr. Acie Fredrickson  . Calculus of kidney   . Cervicalgia   . Cervicalgia   . Clostridium difficile colitis   . Complications affecting other specified body systems, hypertension   . Coronary atherosclerosis of unspecified type of vessel, native or graft   . Degeneration of cervical intervertebral disc   . Depression   . Depressive disorder, not elsewhere classified   . GERD  (gastroesophageal reflux disease)   . HTN (hypertension)   . Hx of echocardiogram    a. Echo 11/12:  Mod LVH, EF 55-60%, MAC, mod LAE, mild RAE  . Impotence of organic origin   . Inguinal hernia without mention of obstruction or gangrene, unilateral or unspecified, (not specified as recurrent)   . Insomnia, unspecified   . Intestinal infection due to Clostridium difficile   . Irritable bowel syndrome   . Kidney stone    "they just passed" (05/03/2012)  . Lumbago   . Nonspecific abnormal electrocardiogram (ECG) (EKG)   . Nonspecific abnormal results of pulmonary system function study   . Obesity, unspecified   . Osteoarthrosis, unspecified whether generalized or localized, unspecified site   . Other malaise and fatigue   . Other specified cardiac dysrhythmias(427.89)   . Pain in joint, ankle and foot   . Pain in joint, lower leg   . Pain in joint, pelvic region and thigh   . Pain in joint, shoulder region   . Partial deafness   . Rash and other nonspecific skin eruption   . Sacroiliitis, not elsewhere classified ([place])   . Sacroiliitis, not elsewhere classified ([place])   . Spasm of muscle   . Special screening for malignant neoplasm of prostate   . Unspecified constipation   . Unspecified hereditary and idiopathic peripheral neuropathy    Past Surgical History:  Procedure Laterality Date  . APPENDECTOMY  ~ 1956  . ATRIAL FLUTTER ABLATION N/A 05/03/2012   Procedure: ATRIAL FLUTTER ABLATION;  Surgeon: Evans Lance, MD;  Location[facility] C[facility] LAB;  Service: Cardiovascular;  Laterality: N/A;  . CARDIAC CATHETERIZATION  08/1999; 05/03/2012   normal coronary arteries;   . CARPAL TUNNEL WITH CUBITAL TUNNEL  2004   "right" (05/03/2012)  . COLONOSCOPY  06/06/2008   severe sigmoid diverticulosis and internal hemorrhoids  . ESOPHAGOGASTRODUODENOSCOPY  06/06/2008   normal  . INGUINAL HERNIA REPAIR  03/23/11   "left" (05/03/2012)  . POSTERIOR LUMBAR FUSION  1989  . reconstruction (r) foot surgery      DR SUE   . RENAL CYST EXCISION     pt denies this hx on 05/03/2012  . [place]  . SPINE SURGERY  1969   Pantopaque Myelography and spinal infusion- Dr. Lyman Speller  . TONSILLECTOMY  ~ 1946  . TOTAL HIP ARTHROPLASTY  1999-2000   bilateral    reports that he has quit smoking. His smoking use included Cigarettes. He has a 10.00 pack-year smoking history. He has never used smokeless tobacco. He reports that he drinks alcohol. He reports that he does not use drugs.  Functional Status Survey:  Family History  Problem Relation Age of Onset  . Hypertension Father   . Colon cancer Neg Hx     Health Maintenance  Topic Date Due  . TETANUS/TDAP  06/01/2021  . INFLUENZA VACCINE  Completed  . PNA vac Low Risk Adult  Completed    Allergies  Allergen Reactions  . Amitriptyline   . Azithromycin     Stomach pain  . Flexeril [Cyclobenzaprine]     Causes dizziness    Allergies as of 01/20/2017      Reactions   Amitriptyline    Azithromycin    Stomach pain   Flexeril [cyclobenzaprine]    Causes dizziness      Medication List       Accurate as of 01/20/17 11:59 PM. Always use your most recent med list.          amLODipine 5 MG tablet Commonly known as:  NORVASC TAKE 1 TABLET BY MOUTH EVERY DAY   aspirin EC 81 MG tablet Take 1 tablet (81 mg total) by mouth daily.   cetirizine 10 MG tablet Commonly known as:  ZYRTEC Take 1 tablet (10 mg total) by mouth daily.   finasteride 5 MG tablet Commonly known as:  PROSCAR One daily to treat prostate   hydrOXYzine 25 MG tablet Commonly known as:  ATARAX/VISTARIL Take 1 tablet (25 mg total) by mouth every 6 (six) hours.   losartan-hydrochlorothiazide 100-25 MG tablet Commonly known as:  HYZAAR TAKE 1 TABLET BY MOUTH EVERY DAY TO CONTROL BLOOD PRESSURE   nystatin 100000 UNIT/ML suspension Commonly known as:  MYCOSTATIN SWISH AND SPIT WITH 5 MILLILITERS BY MOUTH 4 TIMES A DAY   ondansetron 4 MG tablet Commonly  known as:  ZOFRAN TAKE 1 TABLET BY MOUTH EVERY 8 HOURS AS NEEDED FOR NAUSEA   oxycodone 30 MG immediate release tablet Commonly known as:  ROXICODONE One every 6 hours if needed to control pain   Zoster Vac Recomb Adjuvanted injection Commonly known as:  SHINGRIX Inject 0.5 mLs into the muscle once.            Discharge Care Instructions        Start     Ordered   01/20/17 0000  Flu vaccine HIGH DOSE PF (Fluzone High dose)     01/20/17 0916   01/20/17 0000  Zoster Vac Recomb Adjuvanted [facility]) injection   Once     01/20/17 0948      Review of Systems  Constitutional: Positive for malaise/fatigue and weight loss. Negative for chills and fever.       Says he's lost weight but refusing to weigh  HENT: Positive for congestion and hearing loss.   Eyes: Negative for blurred vision.  Respiratory: Negative for shortness of breath.   Cardiovascular: Negative for chest pain, palpitations and leg swelling.  Gastrointestinal: Negative for abdominal pain, blood in stool, constipation, diarrhea and melena.       Constipation better with water drinking  Genitourinary: Positive for frequency. Negative for dysuria.  Musculoskeletal: Positive for joint pain. Negative for falls and myalgias.  Skin: Negative for itching and rash.  Neurological: Negative for dizziness, loss of consciousness and weakness.  Endo/Heme/Allergies: Does not bruise/bleed easily.  Psychiatric/Behavioral: Positive for depression. Negative for memory loss. The patient is nervous/anxious and has insomnia.        Sleeping well with zquil    Vitals:   01/20/17 0906  BP: 130/80  Pulse: 73  Temp: 98 F (36.7 C)  TempSrc: Oral  SpO2: 99%   There is no height or weight on file to calculate BMI. Physical Exam  Constitutional: He is oriented to person, place, and time. He appears well-developed. No distress.  HENT:  Head: Normocephalic and atraumatic.  Cardiovascular: Normal rate, regular rhythm, normal heart  sounds and intact distal pulses.   Pulmonary/Chest: Effort normal and breath sounds normal. No respiratory distress.  Abdominal: Bowel sounds are normal.  Musculoskeletal: He exhibits tenderness.  Decreased ROM right shoulder  Neurological: He is alert and oriented to person, place, and time.  Skin: Skin is warm and dry. Capillary refill takes less than 2 seconds.  Psychiatric: He has a normal mood and affect.    Labs reviewed: Basic Metabolic Panel:  Recent Labs  04/02/16 1208 07/23/16 1158  NA 139 140  K 4.4 4.3  CL 105 104  CO2 24 30  GLUCOSE 86 73  BUN 22 18  CREATININE 1.28* 1.35*  CALCIUM 9.8 9.2  MG 1.8  --    Liver Function Tests:  Recent Labs  07/23/16 1158  AST 13  ALT 9  ALKPHOS 67  BILITOT 1.5*  PROT 6.0*  ALBUMIN 3.9   No results for input(s): LIPASE, AMYLASE in the last 8760 hours. No results for input(s): AMMONIA in the last 8760 hours. CBC: No results for input(s): WBC, NEUTROABS, HGB, HCT, MCV, PLT in the last 8760 hours. Cardiac Enzymes: No results for input(s): CKTOTAL, CKMB, CKMBINDEX, TROPONINI in the last 8760 hours. BNP: Invalid input(s): POCBNP Lab Results  Component Value Date   HGBA1C (H) 08/10/2010    5.8 (NOTE)                                                                       According to the ADA Clinical Practice Recommendations for 2011, when HbA1c is used as a screening test:   >=6.5%   Diagnostic of Diabetes Mellitus           (if abnormal result  is confirmed)  5.7-6.4%   Increased risk of developing Diabetes Mellitus  References:Diagnosis and Classification of Diabetes Mellitus,Diabetes JGGE,3662,94(TMLYY 1):S62-S69 and Standards of Medical Care in         Diabetes - 2011,Diabetes TKPT,4656,81  (Suppl 1):S11-S61.   Lab Results  Component Value Date   TSH 1.520 11/25/2015   No results found for: VITAMINB12 No results found for: FOLATE No results found for: IRON, TIBC, FERRITIN  Assessment/Plan 1. Class 1 obesity due  to excess calories without serious comorbidity with body mass index (BMI) of 33.0 to 33.9 in adult -has been limiting his calories too much and exercising which is not sustainable -discussed he should do at least 1200 calories to 1800 calories b/c of his overall size -we talked about specific foods to eat -we also talked about his exercise program and need to focus on cardio with a small weightlifting routine (he's limited by his rotator cuff, but can do leg work and elastic bands)  2. Essential hypertension -bp improved on current regimen, cont same  3. Insomnia, unspecified type -do not recommend zquil, but he is not interested in alternatives  4. Caregiver stress syndrome -seems this is severe, I have yet to meet his wife, but he seems to be very concerns  and anxious about her and her chronic pain -refuses medication and counseling despite my recommendations  5. Need for immunization against influenza - Flu vaccine HIGH DOSE PF (Fluzone High dose) given  6. Need for shingles vaccine - Zoster Vac Recomb Adjuvanted [facility]) injection; Inject 0.5 mLs into the muscle once.  Dispense: 0.5 mL; Refill: 1 sent to pharmacy  Labs/tests ordered:   Orders Placed This Encounter  Procedures  . Flu vaccine HIGH DOSE PF (Fluzone High dose)   02/10/2017 f/u   Demetres Prochnow L. Arney Mayabb, D.O. [facility] [address], [place] [address] Cell Phone (Mon-Fri 8am-5pm):  [phone] On Call:  [phone] & follow prompts after 5pm & weekends Office Phone:  [phone] Office Fax:  [phone]

## 2017-01-21 ENCOUNTER — Ambulatory Visit (INDEPENDENT_AMBULATORY_CARE_PROVIDER_SITE_OTHER): Payer: Medicare Other | Admitting: Podiatry

## 2017-01-21 ENCOUNTER — Telehealth: Payer: Self-pay | Admitting: *Deleted

## 2017-01-21 DIAGNOSIS — M12811 Other specific arthropathies, not elsewhere classified, right shoulder: Secondary | ICD-10-CM

## 2017-01-21 DIAGNOSIS — M2041 Other hammer toe(s) (acquired), right foot: Secondary | ICD-10-CM

## 2017-01-21 DIAGNOSIS — M75101 Unspecified rotator cuff tear or rupture of right shoulder, not specified as traumatic: Principal | ICD-10-CM

## 2017-01-21 MED ORDER — OXYCODONE HCL 30 MG PO TABS: ORAL_TABLET | ORAL | 0 refills | Status: DC

## 2017-01-21 NOTE — Telephone Encounter (Signed)
Noted.  Contracts indicate that prescriptions will only be given when due and filling early can be considered a violation.  If pills have been stolen, a police report must be filed.  Unfortunately, I have not even met [name] [name] so I don't feel comfortable giving any more pills over a weekend when she will be seen Monday.

## 2017-01-21 NOTE — Telephone Encounter (Addendum)
Pt walked in today to pickup prescription and was concerned because the pharmacy will not fill their ( wife also) prescription. I called the pharmacy and spoke with pharmacist and he stated that every month the pt and wife will try to fill prescription early. Per pharmacy they will no longer fill early for pt and wife. I explained this to pt and he stated that he has misplaced 10-12 pills for himself and his wife. Pt states he doesn't want his wife in pain and want this filled today. Pt then said he will go downtown in [place] and but pills off the street.   while I was doing this phone note another call came in from [facility] [phone]. Pt was there asking the pharmacy to fill this early. I spoke with pharmacist and pt want this rx written differently so they can get a larger quantity. Pharmacist states he will not fill prescription because pt is now pharmacy shopping

## 2017-01-21 NOTE — Progress Notes (Signed)
Subjective:    Patient ID: [name] Camp., male   DOB: 76 y.o.   MRN: [id number]   HPI patient states that this callus is still really bothering me of my third toe    ROS      Objective:  Physical Exam neurovascular status intact with patient found to have a moderate plantar flexion of the third digit right with distal keratotic lesion that's painful when palpated     Assessment:    Lesion secondary to positional component of the toe with keratotic tissue formation     Plan:    H&P condition reviewed read debrided the lesion and discussed hammertoe repair if this were to remain persistent. Explained what the procedure would be with possible distal arthroplasty or proximal arthroplasty with patient

## 2017-01-21 NOTE — Telephone Encounter (Signed)
Patient requested and will pick up NCCSRS Database Checked.  

## 2017-01-26 ENCOUNTER — Encounter: Payer: Medicare Other | Admitting: Internal Medicine

## 2017-01-29 ENCOUNTER — Encounter: Payer: Self-pay | Admitting: Internal Medicine

## 2017-02-09 ENCOUNTER — Telehealth: Payer: Self-pay | Admitting: *Deleted

## 2017-02-09 ENCOUNTER — Encounter: Payer: Self-pay | Admitting: Internal Medicine

## 2017-02-09 NOTE — Telephone Encounter (Signed)
Per verbal from Dr. Mariea Clonts, pt can take ibuprofen with food.

## 2017-02-09 NOTE — Telephone Encounter (Signed)
Discussed Dr.Reed's response with patient. Patient states his wife has an appointment tomorrow with Dr.Reed and he will further address then. Patient is aware that the appointment time is for his wife and her concerns.

## 2017-02-09 NOTE — Telephone Encounter (Signed)
Patient called and stated that he has misplaced his Oxycodone and cannot get it refilled for another 2 weeks. Patient stated that he is in severe pain and wants to know if he can take 3-4 Ibuprofen at a time instead until his pain medication can be refilled. Stated that the pharmacist recommended this. Please Advise.

## 2017-02-10 ENCOUNTER — Other Ambulatory Visit: Payer: Self-pay | Admitting: Internal Medicine

## 2017-02-10 ENCOUNTER — Ambulatory Visit: Payer: Medicare Other | Admitting: Internal Medicine

## 2017-02-10 DIAGNOSIS — G243 Spasmodic torticollis: Secondary | ICD-10-CM

## 2017-02-10 MED ORDER — METAXALONE 800 MG PO TABS: 800.0000 mg | ORAL_TABLET | Freq: Three times a day (TID) | ORAL | 0 refills | Status: DC

## 2017-02-10 NOTE — Progress Notes (Signed)
Pt here with his wife and says his muscles are sore and he can't turn his neck.  He requests a muscle relaxer and was advised to use ibuprofen with food.

## 2017-02-11 ENCOUNTER — Other Ambulatory Visit: Payer: Self-pay | Admitting: Internal Medicine

## 2017-02-11 ENCOUNTER — Telehealth: Payer: Self-pay | Admitting: *Deleted

## 2017-02-11 DIAGNOSIS — M542 Cervicalgia: Secondary | ICD-10-CM

## 2017-02-11 MED ORDER — METHOCARBAMOL 500 MG PO TABS: 500.0000 mg | ORAL_TABLET | Freq: Three times a day (TID) | ORAL | 0 refills | Status: DC | PRN

## 2017-02-11 NOTE — Telephone Encounter (Signed)
Pt calling stating that rx for skelaxin is too expensive, per pt is co-pay is $293.00 and he can't afford that. Please advise new medication.

## 2017-02-11 NOTE — Telephone Encounter (Signed)
I sent in robaxin instead.  Hopefully it's cheaper.  He did not tolerate flexeril in the past due to dizziness.

## 2017-02-14 NOTE — Telephone Encounter (Signed)
Left message on machine with results, advised pt to call if any questions

## 2017-02-16 ENCOUNTER — Ambulatory Visit: Payer: Medicare Other | Admitting: Internal Medicine

## 2017-02-18 ENCOUNTER — Encounter: Payer: Self-pay | Admitting: *Deleted

## 2017-02-18 ENCOUNTER — Other Ambulatory Visit: Payer: Self-pay | Admitting: Internal Medicine

## 2017-02-18 ENCOUNTER — Other Ambulatory Visit: Payer: Medicare Other

## 2017-02-18 ENCOUNTER — Other Ambulatory Visit: Payer: Self-pay | Admitting: *Deleted

## 2017-02-18 DIAGNOSIS — Z79899 Other long term (current) drug therapy: Secondary | ICD-10-CM

## 2017-02-18 DIAGNOSIS — M75101 Unspecified rotator cuff tear or rupture of right shoulder, not specified as traumatic: Principal | ICD-10-CM

## 2017-02-18 DIAGNOSIS — M12811 Other specific arthropathies, not elsewhere classified, right shoulder: Secondary | ICD-10-CM

## 2017-02-18 MED ORDER — OXYCODONE HCL 30 MG PO TABS: ORAL_TABLET | ORAL | 0 refills | Status: DC

## 2017-02-18 NOTE — Telephone Encounter (Signed)
Patient requested and will pick up [place] not checked due to no log in due to new set up.

## 2017-02-23 LAB — DRUG TOX MONITOR 1 W/CONF, ORAL FLD
Amphetamines: NEGATIVE ng/mL (ref <10)
Barbiturates: NEGATIVE ng/mL (ref <10)
Benzodiazepines: NEGATIVE ng/mL (ref <0.50)
Buprenorphine: NEGATIVE ng/mL (ref <0.025)
Cocaine: NEGATIVE ng/mL (ref <2.5)
Fentanyl: NEGATIVE ng/mL (ref <0.10)
Heroin Metabolite: NEGATIVE ng/mL (ref <1.0)
MARIJUANA: NEGATIVE ng/mL (ref <2.5)
MDMA: NEGATIVE ng/mL (ref <10)
Meperidine: NEGATIVE ng/mL (ref <5.0)
Meprobamate: NEGATIVE ng/mL (ref <2.5)
Methadone: NEGATIVE ng/mL (ref <5.0)
Nicotine Metabolite: NEGATIVE ng/mL (ref <5.0)
Opiates: NEGATIVE ng/mL (ref <2.5)
Phencyclidine: NEGATIVE ng/mL (ref <10)
Propoxyphene: NEGATIVE ng/mL (ref <5.0)
Tapentadol: NEGATIVE ng/mL (ref <5.0)
Tramadol: NEGATIVE ng/mL (ref <5.0)
Zolpidem: NEGATIVE ng/mL (ref <5.0)

## 2017-02-25 ENCOUNTER — Other Ambulatory Visit: Payer: Self-pay | Admitting: *Deleted

## 2017-02-25 ENCOUNTER — Telehealth: Payer: Self-pay

## 2017-02-25 NOTE — Telephone Encounter (Signed)
Noted  

## 2017-02-25 NOTE — Telephone Encounter (Signed)
FYI:  Patient's wife called to say that patient could not keep his upcoming appointment on Mar 10, 2017. She stated that "he would not be able to make it."

## 2017-03-10 ENCOUNTER — Ambulatory Visit: Payer: Medicare Other | Admitting: Internal Medicine

## 2017-03-11 ENCOUNTER — Telehealth: Payer: Self-pay | Admitting: Internal Medicine

## 2017-03-11 NOTE — Telephone Encounter (Signed)
Left msg asking pt to call and schedule AWV in November. VDM (DD)

## 2017-03-18 DIAGNOSIS — Z96642 Presence of left artificial hip joint: Secondary | ICD-10-CM | POA: Diagnosis not present

## 2017-03-18 DIAGNOSIS — M25552 Pain in left hip: Secondary | ICD-10-CM | POA: Diagnosis not present

## 2017-03-22 ENCOUNTER — Telehealth: Payer: Self-pay

## 2017-03-22 NOTE — Telephone Encounter (Signed)
Noted.  I suppose he has an endocrine reason to see Dr. Joylene Draft.  He also does primary care and notes indicated that referral was for primary care.  We'll see what transpires.

## 2017-03-22 NOTE — Telephone Encounter (Signed)
[place] database was checked to see if any new prescriptions were given to the patient and there were not any.

## 2017-03-22 NOTE — Telephone Encounter (Signed)
Attempted to call patient to see if he plans to continue receiving care from Dr. Mariea Clonts and [facility] due to pt getting a referral to see Dr. Elta Guadeloupe Perrinifor for medical exam (referral placed by [facility]-Dr. Gladstone Lighter). Based on note received from [facility], patient told that office that he has no medical doctor.    I left a message for patient to call the office to discuss his intention to remain under the care of Dr. Mariea Clonts.

## 2017-03-22 NOTE — Telephone Encounter (Signed)
I spoke with patient and he stated that he was not changing PCP. The referral was put in as a consult with Dr. Joylene Draft ([place]). Patient did not want to discuss the reason for the consult but he stressed that he was not intending to find a new PCP.

## 2017-03-24 ENCOUNTER — Other Ambulatory Visit: Payer: Self-pay | Admitting: Internal Medicine

## 2017-03-24 ENCOUNTER — Other Ambulatory Visit: Payer: Self-pay | Admitting: *Deleted

## 2017-03-24 DIAGNOSIS — M12811 Other specific arthropathies, not elsewhere classified, right shoulder: Secondary | ICD-10-CM

## 2017-03-24 DIAGNOSIS — M75101 Unspecified rotator cuff tear or rupture of right shoulder, not specified as traumatic: Principal | ICD-10-CM

## 2017-03-24 MED ORDER — OXYCODONE HCL 30 MG PO TABS: ORAL_TABLET | ORAL | 0 refills | Status: DC

## 2017-03-24 NOTE — Telephone Encounter (Signed)
Patient wife, Hassan Rowan requested and will pick up. [place] Database Verified.

## 2017-03-24 NOTE — Progress Notes (Signed)
Pt was not taking this anymore at his last appt.  He was using muscle relaxants and ibuprofen.  He recently saw orthopedic surgery also.  It does not make sense to go from zero oxycodone to 30mg  of oxycodone every 6 hours for pain.  His wife is on the same medication.   [name] [name], D.O. [facility] [address], [place] [address] Cell Phone (Mon-Fri 8am-5pm):  [phone] On Call:  [phone] & follow prompts after 5pm & weekends Office Phone:  [phone] Office Fax:  [phone]

## 2017-03-24 NOTE — Telephone Encounter (Signed)
Reed, [place], DO at 03/24/2017 4:04 PM   Status: Signed    Pt was not taking this anymore at his last appt.  He was using muscle relaxants and ibuprofen.  He recently saw orthopedic surgery also.  It does not make sense to go from zero oxycodone to 30mg  of oxycodone every 6 hours for pain.  His wife is on the same medication.   Tiffany L. Reed, D.O. [facility] [address], [place] [address] Cell Phone (Mon-Fri 8am-5pm):  [phone] On Call:  [phone] & follow prompts after 5pm & weekends Office Phone:  [phone] Office Fax:  [phone]

## 2017-03-24 NOTE — Telephone Encounter (Signed)
.  left message to have patient return my call.  Called and spoke with wife and advised that because of negative drug screen, pt's med refill has been denied. Per wife she wanted Dr. Mariea Clonts to reconsider and I advised that pt is not taking medication per drug screen, per wife pt doesn't take it everyday and doesn't take it 4 times a day like her. I still advised that refill was denied.

## 2017-03-25 ENCOUNTER — Telehealth: Payer: Self-pay | Admitting: *Deleted

## 2017-03-25 MED ORDER — OXYCODONE HCL 5 MG PO TABS: ORAL_TABLET | ORAL | 0 refills | Status: DC

## 2017-03-25 NOTE — Telephone Encounter (Signed)
Patient notified and agreed. Rx printed and left up front for pick up

## 2017-03-25 NOTE — Telephone Encounter (Signed)
Patient walked into office requesting his Rx for Oxycodone. It was printed yesterday but due to patient's drug screen he is not eligible to receive it due to it not being in his system. I tried to explain this to the patient but he stated that he already discussed this with Dr. Mariea Clonts and stated that he lost the Rx and it wasn't in his system for almost 2 weeks. Stated that he does take it and is worried because he has shoulder, elbow and hip pain and Ibuprofen nor Tylenol works for him and he doesn't want to go the weekend without it. Stated that he has tried several Arthritis medications with no relief. He stated that he is ok with the Decrease in the Oxycodone as discussed but not completely taking him off of it. Patient very confused about taking him completely off of a pain medication that helps and he has taken for years. Patient would like his pain medication and stated to decrease it if needed but please don't take off completely. Please Advise.

## 2017-03-25 NOTE — Telephone Encounter (Signed)
For the records:  I am ok with decreasing his dosage of oxycodone, but I see no reason why he could go without it altogether and suddenly need 30mg  multiple times per day.  I was aware that he was "out of the medication" at that time, but appeared to be doing ok with the tylenol, ibuprofen and muscle relaxers when I saw him.    Please tell him:  Let's begin oxycodone 5mg  po bid prn severe pain, #28.  He should be using this only when his pain is unrelieved with the over the counter medications.  His contract reads that he is getting his medications at the [facility] [address].   It is now recommended per the DEA that only a short course of medication be provided when being used as needed for pain.

## 2017-04-04 ENCOUNTER — Other Ambulatory Visit: Payer: Self-pay | Admitting: Internal Medicine

## 2017-04-10 ENCOUNTER — Encounter: Payer: Self-pay | Admitting: Internal Medicine

## 2017-04-10 DIAGNOSIS — I10 Essential (primary) hypertension: Secondary | ICD-10-CM

## 2017-04-11 DIAGNOSIS — M25552 Pain in left hip: Secondary | ICD-10-CM | POA: Diagnosis not present

## 2017-04-11 DIAGNOSIS — Z96642 Presence of left artificial hip joint: Secondary | ICD-10-CM | POA: Diagnosis not present

## 2017-04-11 DIAGNOSIS — G8929 Other chronic pain: Secondary | ICD-10-CM | POA: Diagnosis not present

## 2017-04-11 DIAGNOSIS — M545 Low back pain: Secondary | ICD-10-CM | POA: Diagnosis not present

## 2017-04-12 ENCOUNTER — Telehealth: Payer: Self-pay

## 2017-04-12 MED ORDER — AMLODIPINE BESYLATE 5 MG PO TABS: 5.0000 mg | ORAL_TABLET | Freq: Every day | ORAL | 5 refills | Status: DC

## 2017-04-12 NOTE — Telephone Encounter (Signed)
Patient called to request increase in finasteride dosage or change in medication all together, patient states frequent urination is not much better.   Please advise

## 2017-04-12 NOTE — Telephone Encounter (Addendum)
Amlodipine filled, refer to mychart message dated 04/10/17. I called patient x 2, it appeared that someone was picking up the phone and hanging up, unable tp leave message.  I will try to reach patient again later

## 2017-04-12 NOTE — Telephone Encounter (Signed)
The finasteride is not brand new. Over what time frame has his frequency worsened?  Does he have urgency or dysuria?  How many times is he urinating in the day?  At night?    Also, Did he need the amlodipine for his blood pressure?

## 2017-04-13 NOTE — Telephone Encounter (Signed)
Spoke with patient, patient states symptoms worse x several months, patient with urgency, 8-9 times daily and 1-2 times in the middle of the night.   Please advise

## 2017-04-13 NOTE — Telephone Encounter (Signed)
Patient notified. Had to leave detail message on VM. Samples left up front for pick up.

## 2017-04-13 NOTE — Telephone Encounter (Signed)
Ok, let's try him on myrbetriq 25mg  daily.  He could pick up samples to see if it helps.

## 2017-04-20 ENCOUNTER — Ambulatory Visit (INDEPENDENT_AMBULATORY_CARE_PROVIDER_SITE_OTHER): Payer: Medicare Other | Admitting: Orthopedic Surgery

## 2017-04-20 ENCOUNTER — Other Ambulatory Visit: Payer: Self-pay | Admitting: *Deleted

## 2017-04-20 MED ORDER — OXYCODONE HCL 5 MG PO TABS: ORAL_TABLET | ORAL | 0 refills | Status: DC

## 2017-04-20 NOTE — Telephone Encounter (Signed)
Patient requested and will pick up NCCS[place] Database Verified.  

## 2017-04-27 ENCOUNTER — Encounter: Payer: Self-pay | Admitting: Internal Medicine

## 2017-05-04 ENCOUNTER — Ambulatory Visit (INDEPENDENT_AMBULATORY_CARE_PROVIDER_SITE_OTHER): Payer: Medicare Other | Admitting: Orthopedic Surgery

## 2017-05-04 ENCOUNTER — Ambulatory Visit (INDEPENDENT_AMBULATORY_CARE_PROVIDER_SITE_OTHER): Payer: Medicare Other

## 2017-05-04 ENCOUNTER — Encounter (INDEPENDENT_AMBULATORY_CARE_PROVIDER_SITE_OTHER): Payer: Self-pay | Admitting: Orthopedic Surgery

## 2017-05-04 DIAGNOSIS — M25552 Pain in left hip: Secondary | ICD-10-CM

## 2017-05-04 DIAGNOSIS — Z96642 Presence of left artificial hip joint: Secondary | ICD-10-CM

## 2017-05-04 NOTE — Progress Notes (Signed)
Office Visit Note   Patient: [name] [name].           Date of Birth: May 08, 1941           MRN: [id number] Visit Date: 05/04/2017 Requested by: Gayland Curry, DO [place], [place] [address] PCP: Gayland Curry, DO  Subjective: Chief Complaint  Patient presents with  . Lower Back - Pain  . Left Hip - Pain    HPI: [name] [name] is a patient with left hip which is primarily buttock pain.  He had bilateral total hip replacements 20 years ago.  He describes acute pain of the left hip beginning 2 months ago.  Denies any fevers chills or groin pain.  Does not report that the area is tender to touch.  He is having difficulty with stairs.  Describes dull aching in the posterior buttock region.  He feels like the left leg is tight.  He is using a cane.  Ibuprofen is not helpful.  It is hard for him to get up from a chair.              ROS: All systems reviewed are negative as they relate to the chief complaint within the history of present illness.  Patient denies  fevers or chills.   Assessment & Plan: Visit Diagnoses:  1. Pain in left hip   2. Presence of left artificial hip joint     Plan: Impression is possible ostial lysis in the left hip acetabular region.  Superiorly the cup appears to be well fixed but medially there is some asymmetric ostial lysis comparing right side to left side.  Patient needs CT scan to more fully evaluate this possible ostial lysis.  However I do not think this is where he is deriving his symptoms from.  I think he has significant back problems and nerve compression giving him his buttock pain.  Needs MRI scan lumbar spine to evaluate left-sided radiculopathy with likely ESI's to follow.  Follow-Up Instructions: Return for after MRI.   Orders:  Orders Placed This Encounter  Procedures  . XR HIP UNILAT W OR W/O PELVIS 2-3 VIEWS LEFT  . XR Lumbar Spine 2-3 Views  . CT HIP LEFT WO CONTRAST  . MR Lumbar Spine w/o contrast   No orders of the defined  types were placed in this encounter.     Procedures: No procedures performed   Clinical Data: No additional findings.  Objective: Vital Signs: There were no vitals taken for this visit.  Physical Exam:   Constitutional: Patient appears well-developed HEENT:  Head: Normocephalic Eyes:EOM are normal Neck: Normal range of motion Cardiovascular: Normal rate Pulmonary/chest: Effort normal Neurologic: Patient is alert Skin: Skin is warm Psychiatric: Patient has normal mood and affect    Ortho Exam: Orthopedic exam demonstrates slightly antalgic gait to the left but it is not Trendelenburg.  Patient has palpable pedal pulses and good ankle dorsiflexion plantar flexion quad and hamstring strength with no nerve root tension signs and no groin pain with internal/external rotation of the leg.  No real skin changes noted in the back region.  No discrete trochanteric tenderness and no sacroiliac joint tenderness on the left or right hand side  Specialty Comments:  No specialty comments available.  Imaging: Xr Hip Unilat W Or W/o Pelvis 2-3 Views Left  Result Date: 05/04/2017 AP pelvis lateral left hip reviewed.  Total hip prosthesis in good position and alignment.  Possible ostial lysis medial to the  acetabular cup on the left is noted.  No evidence of femoral ostial lysis or loosening of the femoral component  Xr Lumbar Spine 2-3 Views  Result Date: 05/04/2017 AP lateral lumbar spine reviewed.  Patient has degenerative disc disease and significant spurring both anteriorly and posteriorly throughout the lumbar spine.  No compression fractures or spondylolisthesis is present.  The patient has significant facet arthritis present.    PMFS History: Patient Active Problem List   Diagnosis Date Noted  . Seborrheic keratosis 08/17/2016  . Sprain of ankle 05/12/2016  . Rash and nonspecific skin eruption 01/06/2016  . Pain in both knees 11/25/2015  . Myalgia and myositis 11/25/2015  .  Weak 11/25/2015  . Insomnia 09/03/2015  . Frequency of urination 05/27/2015  . Post herpetic Neuropathy 03/12/2015  . Depression, major, recurrent, moderate ([place]) 03/12/2015  . Stress at home 01/26/2015  . Pain in left foot 06/26/2014  . Corn of toe 01/09/2014  . Allergic rhinitis 10/01/2013  . Rotator cuff tear arthropathy of right shoulder 10/01/2013  . Pain in joint, shoulder region 01/02/2013  . HTN (hypertension)   . Partial deafness   . Obesity   . Hammertoe 10/04/2012  . CKD (chronic kidney disease) 03/22/2012  . Chronic constipation 11/13/2010   Past Medical History:  Diagnosis Date  . Allergic rhinitis due to pollen   . Allergy   . Anxiety state, unspecified   . Arthritis    "right shoulder" (05/03/2012)  . Atrial flutter ([place])    a. dx after inguinal hernia repair in 10/12 => seen by Dr. Acie Fredrickson  . Calculus of kidney   . Cervicalgia   . Cervicalgia   . Clostridium difficile colitis   . Complications affecting other specified body systems, hypertension   . Coronary atherosclerosis of unspecified type of vessel, native or graft   . Degeneration of cervical intervertebral disc   . Depression   . Depressive disorder, not elsewhere classified   . GERD (gastroesophageal reflux disease)   . HTN (hypertension)   . Hx of echocardiogram    a. Echo 11/12:  Mod LVH, EF 55-60%, MAC, mod LAE, mild RAE  . Impotence of organic origin   . Inguinal hernia without mention of obstruction or gangrene, unilateral or unspecified, (not specified as recurrent)   . Insomnia, unspecified   . Intestinal infection due to Clostridium difficile   . Irritable bowel syndrome   . Kidney stone    "they just passed" (05/03/2012)  . Lumbago   . Nonspecific abnormal electrocardiogram (ECG) (EKG)   . Nonspecific abnormal results of pulmonary system function study   . Obesity, unspecified   . Osteoarthrosis, unspecified whether generalized or localized, unspecified site   . Other malaise and  fatigue   . Other specified cardiac dysrhythmias(427.89)   . Pain in joint, ankle and foot   . Pain in joint, lower leg   . Pain in joint, pelvic region and thigh   . Pain in joint, shoulder region   . Partial deafness   . Rash and other nonspecific skin eruption   . Sacroiliitis, not elsewhere classified ([place])   . Sacroiliitis, not elsewhere classified ([place])   . Spasm of muscle   . Special screening for malignant neoplasm of prostate   . Unspecified constipation   . Unspecified hereditary and idiopathic peripheral neuropathy     Family History  Problem Relation Age of Onset  . Hypertension Father   . Colon cancer Neg Hx     Past Surgical  History:  Procedure Laterality Date  . APPENDECTOMY  ~ 1956  . ATRIAL FLUTTER ABLATION N/A 05/03/2012   Procedure: ATRIAL FLUTTER ABLATION;  Surgeon: Evans Lance, MD;  [facility];  Service: Cardiovascular;  Laterality: N/A;  . CARDIAC CATHETERIZATION  08/1999; 05/03/2012   normal coronary arteries;   . CARPAL TUNNEL WITH CUBITAL TUNNEL  2004   "right" (05/03/2012)  . COLONOSCOPY  06/06/2008   severe sigmoid diverticulosis and internal hemorrhoids  . ESOPHAGOGASTRODUODENOSCOPY  06/06/2008   normal  . INGUINAL HERNIA REPAIR  03/23/11   "left" (05/03/2012)  . POSTERIOR LUMBAR FUSION  1989  . reconstruction (r) foot surgery     DR SUE   . RENAL CYST EXCISION     pt denies this hx on 05/03/2012  . [place]  . SPINE SURGERY  1969   Pantopaque Myelography and spinal infusion- Dr. Lyman Speller  . TONSILLECTOMY  ~ 1946  . TOTAL HIP ARTHROPLASTY  1999-2000   bilateral   Social History   Occupational History  . Not on file  Tobacco Use  . Smoking status: Former Smoker    Packs/day: 2.00    Years: 5.00    Pack years: 10.00    Types: Cigarettes  . Smokeless tobacco: Never Used  . Tobacco comment: 05/03/2012 "smoked from age 59 to 82"  Substance and Sexual Activity  . Alcohol use: Yes    Alcohol/week: 0.0 oz  . Drug use: No    . Sexual activity: Yes    Partners: Female

## 2017-05-16 ENCOUNTER — Encounter: Payer: Self-pay | Admitting: Internal Medicine

## 2017-05-18 ENCOUNTER — Ambulatory Visit: Payer: Medicare Other | Admitting: Podiatry

## 2017-05-19 ENCOUNTER — Encounter: Payer: Self-pay | Admitting: Internal Medicine

## 2017-05-20 ENCOUNTER — Encounter: Payer: Self-pay | Admitting: Internal Medicine

## 2017-05-20 ENCOUNTER — Other Ambulatory Visit: Payer: Self-pay | Admitting: *Deleted

## 2017-05-20 ENCOUNTER — Other Ambulatory Visit: Payer: Self-pay | Admitting: Nurse Practitioner

## 2017-05-20 MED ORDER — OXYCODONE HCL 5 MG PO TABS: ORAL_TABLET | ORAL | 0 refills | Status: DC

## 2017-05-20 NOTE — Telephone Encounter (Signed)
Patient Requested [place] Confirmed Sent to [facility] for approval due to Dr. Mariea Clonts off.

## 2017-05-20 NOTE — Telephone Encounter (Signed)
Patient stated that he was just in to see Dr. Mariea Clonts. Stated that he has nothing for pain and Dr. Cyndi Lennert next available isn't until 06/13/17 and that is a "same day". Patient states he has nothing to control his pain. Patient aware this message will not be addressed till Thursday 12/27. Please Advise.

## 2017-05-20 NOTE — Telephone Encounter (Signed)
Has no follow up scheduled with Dr Mariea Clonts, per her last telephone note he is only to use this for a short course for SEVERE pain only- he needs to follow up with Dr Mariea Clonts for additional refills

## 2017-05-26 ENCOUNTER — Other Ambulatory Visit: Payer: Self-pay

## 2017-05-26 ENCOUNTER — Inpatient Hospital Stay: Admission: RE | Admit: 2017-05-26 | Payer: Self-pay | Source: Ambulatory Visit

## 2017-05-27 ENCOUNTER — Other Ambulatory Visit: Payer: Self-pay

## 2017-06-01 ENCOUNTER — Ambulatory Visit
Admission: RE | Admit: 2017-06-01 | Discharge: 2017-06-01 | Disposition: A | Discharge status: Home / Self Care | Payer: Medicare Other | Source: Ambulatory Visit | Attending: Orthopedic Surgery | Admitting: Orthopedic Surgery

## 2017-06-01 DIAGNOSIS — M25552 Pain in left hip: Secondary | ICD-10-CM | POA: Diagnosis not present

## 2017-06-01 DIAGNOSIS — Z96642 Presence of left artificial hip joint: Secondary | ICD-10-CM

## 2017-06-02 ENCOUNTER — Encounter: Payer: Self-pay | Admitting: Podiatry

## 2017-06-02 ENCOUNTER — Ambulatory Visit: Payer: Medicare Other | Admitting: Podiatry

## 2017-06-02 DIAGNOSIS — M2041 Other hammer toe(s) (acquired), right foot: Secondary | ICD-10-CM | POA: Diagnosis not present

## 2017-06-02 DIAGNOSIS — Q828 Other specified congenital malformations of skin: Secondary | ICD-10-CM

## 2017-06-02 NOTE — Progress Notes (Signed)
Subjective:   Patient ID: [name] Camp., male   DOB: 77 y.o.   MRN: [id number]   HPI Patient presents with continuation of discomfort third digit right foot that is painful when pressed and making shoe gear difficult.  States he only got several weeks of relief from the previous treatment   ROS      Objective:  Physical Exam  Neurovascular status intact with elongated third digit right with distal keratotic lesion on the toe that is painful when pressed and make shoe gear difficult     Assessment:  Chronic hammertoe deformity with distal keratotic lesion digit 3 right     Plan:  H&P condition reviewed debridement accomplished and I discussed digital fusion with probable distal arthroplasty digit 3 right.  I do think if he only gets several weeks of relief this is can be necessary and I went ahead and educated him on procedure and he will make a decision based on the response to my conservative treatment and decide whether or not surgery is appropriate.  Reappoint to recheck

## 2017-06-03 ENCOUNTER — Telehealth (INDEPENDENT_AMBULATORY_CARE_PROVIDER_SITE_OTHER): Payer: Self-pay | Admitting: Orthopedic Surgery

## 2017-06-03 NOTE — Telephone Encounter (Signed)
Patient called back again and left voicemail requesting CT results.  I had to leave a return message on patients voicemail.  I was going to notify patient that original message had been sent to Dr. Marlou Sa who is not in office, and that I could schedule him for a CT review appointment.

## 2017-06-03 NOTE — Telephone Encounter (Signed)
Patient wants results of scan. Please advise.

## 2017-06-03 NOTE — Telephone Encounter (Signed)
Pt called and would like to know if his CT scan results are available

## 2017-06-06 ENCOUNTER — Ambulatory Visit (INDEPENDENT_AMBULATORY_CARE_PROVIDER_SITE_OTHER): Payer: Medicare Other | Admitting: Orthopedic Surgery

## 2017-06-06 ENCOUNTER — Encounter (INDEPENDENT_AMBULATORY_CARE_PROVIDER_SITE_OTHER): Payer: Self-pay | Admitting: Orthopedic Surgery

## 2017-06-06 DIAGNOSIS — M25552 Pain in left hip: Secondary | ICD-10-CM | POA: Diagnosis not present

## 2017-06-06 DIAGNOSIS — M1712 Unilateral primary osteoarthritis, left knee: Secondary | ICD-10-CM | POA: Diagnosis not present

## 2017-06-06 MED ORDER — METHOCARBAMOL 500 MG PO TABS: 500.0000 mg | ORAL_TABLET | Freq: Two times a day (BID) | ORAL | 0 refills | Status: DC

## 2017-06-06 NOTE — Telephone Encounter (Signed)
I called.

## 2017-06-07 LAB — CBC WITH DIFFERENTIAL/PLATELET
BASOS PCT: 1.1 %
Basophils Absolute: 59 cells/uL (ref 0–200)
Eosinophils Absolute: 319 cells/uL (ref 15–500)
Eosinophils Relative: 5.9 %
HEMATOCRIT: 39.6 % (ref 38.5–50.0)
HEMOGLOBIN: 13.4 g/dL (ref 13.2–17.1)
Lymphs Abs: 1075 cells/uL (ref 850–3900)
MCH: 30.2 pg (ref 27.0–33.0)
MCHC: 33.8 g/dL (ref 32.0–36.0)
MCV: 89.4 fL (ref 80.0–100.0)
MPV: 10.3 fL (ref 7.5–12.5)
Monocytes Relative: 11.4 %
NEUTROS ABS: 3332 {cells}/uL (ref 1500–7800)
NEUTROS PCT: 61.7 %
Platelets: 208 10*3/uL (ref 140–400)
RBC: 4.43 10*6/uL (ref 4.20–5.80)
RDW: 12.9 % (ref 11.0–15.0)
Total Lymphocyte: 19.9 %
WBC mixed population: 616 cells/uL (ref 200–950)
WBC: 5.4 10*3/uL (ref 3.8–10.8)

## 2017-06-07 LAB — C-REACTIVE PROTEIN: CRP: 6.7 mg/L (ref <8.0)

## 2017-06-07 LAB — SEDIMENTATION RATE: Sed Rate: 17 mm/h (ref 0–20)

## 2017-06-10 ENCOUNTER — Encounter (INDEPENDENT_AMBULATORY_CARE_PROVIDER_SITE_OTHER): Payer: Self-pay | Admitting: Orthopedic Surgery

## 2017-06-10 DIAGNOSIS — M1712 Unilateral primary osteoarthritis, left knee: Secondary | ICD-10-CM | POA: Diagnosis not present

## 2017-06-10 MED ORDER — METHYLPREDNISOLONE ACETATE 40 MG/ML IJ SUSP
40.0000 mg | INTRAMUSCULAR | Status: AC | PRN
  Administered 2017-06-10: 40 mg via INTRA_ARTICULAR

## 2017-06-10 MED ORDER — BUPIVACAINE HCL 0.25 % IJ SOLN
4.0000 mL | INTRAMUSCULAR | Status: AC | PRN
  Administered 2017-06-10: 4 mL via INTRA_ARTICULAR

## 2017-06-10 MED ORDER — LIDOCAINE HCL 1 % IJ SOLN
5.0000 mL | INTRAMUSCULAR | Status: AC | PRN
  Administered 2017-06-10: 5 mL

## 2017-06-10 NOTE — Progress Notes (Signed)
Office Visit Note   Patient: [name] [name].           Date of Birth: 1941/03/23           MRN: [id number] Visit Date: 06/06/2017 Requested by: [name] Curry, DO [place], [place] [id number] PCP: [name] Curry, DO  Subjective: Chief Complaint  Patient presents with  . Left Hip - Follow-up    HPI: [name] [name] is a 77 year old patient with left hip pain.  Since I have seen him he has had a CT scan which is reviewed.  There is a fluid or soft tissue collection around the hip joint.  This is reviewed with the patient.  He has no history of gout or pseudogout but does have some calcification in the knees which may indicate pseudogout.  Is not having any groin pain or any fevers and chills.  His hip replacements are about 78 years old.  He is currently not taking any blood thinners.              ROS: All systems reviewed are negative as they relate to the chief complaint within the history of present illness.  Patient denies  fevers or chills.   Assessment & Plan: Visit Diagnoses:  1. Pain in left hip     Plan: Impression is left hip pain which may be coming from the back or may represent particle wear.  Infection less likely based on the symptoms.  He also reports left knee pain with a known history of arthritis in this area.  Plan is blood work consisting of CBC differential sed rate C-reactive protein which are normal at the time of this dictation.  I like to send him to Dr. Ernestina [name] for aspiration of that left hip sent for Gram stain cell count aerobic and anaerobic culture.  The left knee is also injected aspirated today.  Also will place him on Robaxin as a muscle relaxer.  Follow-up in 4 weeks.  Follow-Up Instructions: Return in about 4 weeks (around 07/04/2017).   Orders:  Orders Placed This Encounter  Procedures  . CBC with Differential  . Sed Rate (ESR)  . C-reactive protein  . Ambulatory referral to Physical Medicine Rehab   Meds ordered this encounter  Medications    . methocarbamol (ROBAXIN) 500 MG tablet    Sig: Take 1 tablet (500 mg total) by mouth 2 (two) times daily.    Dispense:  30 tablet    Refill:  0      Procedures: Large Joint Inj: L knee on 06/10/2017 6:13 PM Indications: diagnostic evaluation, joint swelling and pain Details: 18 G 1.5 in needle, superolateral approach  Arthrogram: No  Medications: 5 mL lidocaine 1 %; 40 mg methylPREDNISolone acetate 40 MG/ML; 4 mL bupivacaine 0.25 % Outcome: tolerated well, no immediate complications Procedure, treatment alternatives, risks and benefits explained, specific risks discussed. Consent was given by the patient. Immediately prior to procedure a time out was called to verify the correct patient, procedure, equipment, support staff and site/side marked as required. Patient was prepped and draped in the usual sterile fashion.       Clinical Data: No additional findings.  Objective: Vital Signs: There were no vitals taken for this visit.  Physical Exam:   Constitutional: Patient appears well-developed HEENT:  Head: Normocephalic Eyes:EOM are normal Neck: Normal range of motion Cardiovascular: Normal rate Pulmonary/chest: Effort normal Neurologic: Patient is alert Skin: Skin is warm Psychiatric: Patient has normal mood and affect  Ortho Exam: Orthopedic exam demonstrates normal gait and alignment with no real groin pain with internal/external rotation of the leg.  Trace effusion in that left knee with intact extensor mechanism and palpable pedal pulses.  No lymphadenopathy or warmth in the left hip region.  Collateral cruciate ligaments stable in the left knee.  Specialty Comments:  No specialty comments available.  Imaging: No results found.   PMFS History: Patient Active Problem List   Diagnosis Date Noted  . Seborrheic keratosis 08/17/2016  . Sprain of ankle 05/12/2016  . Rash and nonspecific skin eruption 01/06/2016  . Pain in both knees 11/25/2015  . Myalgia  and myositis 11/25/2015  . Weak 11/25/2015  . Insomnia 09/03/2015  . Frequency of urination 05/27/2015  . Post herpetic Neuropathy 03/12/2015  . Depression, major, recurrent, moderate ([place]) 03/12/2015  . Stress at home 01/26/2015  . Pain in left foot 06/26/2014  . Corn of toe 01/09/2014  . Allergic rhinitis 10/01/2013  . Rotator cuff tear arthropathy of right shoulder 10/01/2013  . Pain in joint, shoulder region 01/02/2013  . HTN (hypertension)   . Partial deafness   . Obesity   . Hammertoe 10/04/2012  . CKD (chronic kidney disease) 03/22/2012  . Chronic constipation 11/13/2010   Past Medical History:  Diagnosis Date  . Allergic rhinitis due to pollen   . Allergy   . Anxiety state, unspecified   . Arthritis    "right shoulder" (05/03/2012)  . Atrial flutter ([place])    a. dx after inguinal hernia repair in 10/12 => seen by Dr. Acie Fredrickson  . Calculus of kidney   . Cervicalgia   . Cervicalgia   . Clostridium difficile colitis   . Complications affecting other specified body systems, hypertension   . Coronary atherosclerosis of unspecified type of vessel, native or graft   . Degeneration of cervical intervertebral disc   . Depression   . Depressive disorder, not elsewhere classified   . GERD (gastroesophageal reflux disease)   . HTN (hypertension)   . Hx of echocardiogram    a. Echo 11/12:  Mod LVH, EF 55-60%, MAC, mod LAE, mild RAE  . Impotence of organic origin   . Inguinal hernia without mention of obstruction or gangrene, unilateral or unspecified, (not specified as recurrent)   . Insomnia, unspecified   . Intestinal infection due to Clostridium difficile   . Irritable bowel syndrome   . Kidney stone    "they just passed" (05/03/2012)  . Lumbago   . Nonspecific abnormal electrocardiogram (ECG) (EKG)   . Nonspecific abnormal results of pulmonary system function study   . Obesity, unspecified   . Osteoarthrosis, unspecified whether generalized or localized, unspecified site    . Other malaise and fatigue   . Other specified cardiac dysrhythmias(427.89)   . Pain in joint, ankle and foot   . Pain in joint, lower leg   . Pain in joint, pelvic region and thigh   . Pain in joint, shoulder region   . Partial deafness   . Rash and other nonspecific skin eruption   . Sacroiliitis, not elsewhere classified ([place])   . Sacroiliitis, not elsewhere classified ([place])   . Spasm of muscle   . Special screening for malignant neoplasm of prostate   . Unspecified constipation   . Unspecified hereditary and idiopathic peripheral neuropathy     Family History  Problem Relation Age of Onset  . Hypertension Father   . Colon cancer Neg Hx     Past Surgical History:  Procedure Laterality Date  . APPENDECTOMY  ~ 1956  . ATRIAL FLUTTER ABLATION N/A 05/03/2012   Procedure: ATRIAL FLUTTER ABLATION;  Surgeon: Evans Lance, MD;  Location: [facility] CATH LAB;  Service: Cardiovascular;  Laterality: N/A;  . CARDIAC CATHETERIZATION  08/1999; 05/03/2012   normal coronary arteries;   . CARPAL TUNNEL WITH CUBITAL TUNNEL  2004   "right" (05/03/2012)  . COLONOSCOPY  06/06/2008   severe sigmoid diverticulosis and internal hemorrhoids  . ESOPHAGOGASTRODUODENOSCOPY  06/06/2008   normal  . INGUINAL HERNIA REPAIR  03/23/11   "left" (05/03/2012)  . POSTERIOR LUMBAR FUSION  1989  . reconstruction (r) foot surgery     DR SUE   . RENAL CYST EXCISION     pt denies this hx on 05/03/2012  . [place]  . SPINE SURGERY  1969   Pantopaque Myelography and spinal infusion- Dr. Lyman Speller  . TONSILLECTOMY  ~ 1946  . TOTAL HIP ARTHROPLASTY  1999-2000   bilateral   Social History   Occupational History  . Not on file  Tobacco Use  . Smoking status: Former Smoker    Packs/day: 2.00    Years: 5.00    Pack years: 10.00    Types: Cigarettes  . Smokeless tobacco: Never Used  . Tobacco comment: 05/03/2012 "smoked from age 30 to 74"  Substance and Sexual Activity  . Alcohol use: Yes    Alcohol/week:  0.0 oz  . Drug use: No  . Sexual activity: Yes    Partners: Female

## 2017-06-16 ENCOUNTER — Other Ambulatory Visit: Payer: Self-pay | Admitting: Internal Medicine

## 2017-06-16 ENCOUNTER — Telehealth: Payer: Self-pay | Admitting: *Deleted

## 2017-06-16 DIAGNOSIS — M25519 Pain in unspecified shoulder: Secondary | ICD-10-CM

## 2017-06-16 DIAGNOSIS — M25561 Pain in right knee: Secondary | ICD-10-CM

## 2017-06-16 DIAGNOSIS — M25562 Pain in left knee: Secondary | ICD-10-CM

## 2017-06-16 DIAGNOSIS — G8929 Other chronic pain: Secondary | ICD-10-CM

## 2017-06-16 MED ORDER — OXYCODONE HCL 5 MG PO TABS: ORAL_TABLET | ORAL | 0 refills | Status: DC

## 2017-06-16 NOTE — Telephone Encounter (Signed)
Patient called and stated that the Oxycodone 5mg  is not helping with the pain. Only received #14 this past time. Patient states he cannot sleep at night, can't hardly walk due to the pain. Has neck and back pain. Has tried to take the OTC Ibuprofen with no relief. Patient is requesting an increase. Please Advise.

## 2017-06-16 NOTE — Telephone Encounter (Signed)
I ordered a full month's supply of oxycodone 5mg  twice a day as needed for severe pain unrelieved with tylenol or ibuprofen.

## 2017-06-16 NOTE — Telephone Encounter (Signed)
Patient notified. LM with Dr. Cyndi Lennert response.

## 2017-06-20 ENCOUNTER — Ambulatory Visit (INDEPENDENT_AMBULATORY_CARE_PROVIDER_SITE_OTHER): Payer: Medicare Other | Admitting: Internal Medicine

## 2017-06-20 ENCOUNTER — Ambulatory Visit (INDEPENDENT_AMBULATORY_CARE_PROVIDER_SITE_OTHER): Payer: Medicare Other

## 2017-06-20 ENCOUNTER — Encounter: Payer: Self-pay | Admitting: Internal Medicine

## 2017-06-20 VITALS — BP 150/82 | HR 72 | Temp 97.5°F | Ht 75.0 in | Wt 276.0 lb

## 2017-06-20 DIAGNOSIS — R457 State of emotional shock and stress, unspecified: Secondary | ICD-10-CM | POA: Diagnosis not present

## 2017-06-20 DIAGNOSIS — I10 Essential (primary) hypertension: Secondary | ICD-10-CM | POA: Diagnosis not present

## 2017-06-20 DIAGNOSIS — M25551 Pain in right hip: Secondary | ICD-10-CM

## 2017-06-20 DIAGNOSIS — M25511 Pain in right shoulder: Secondary | ICD-10-CM | POA: Diagnosis not present

## 2017-06-20 DIAGNOSIS — R350 Frequency of micturition: Secondary | ICD-10-CM

## 2017-06-20 DIAGNOSIS — M25552 Pain in left hip: Secondary | ICD-10-CM | POA: Diagnosis not present

## 2017-06-20 DIAGNOSIS — Z6833 Body mass index (BMI) 33.0-33.9, adult: Secondary | ICD-10-CM | POA: Diagnosis not present

## 2017-06-20 DIAGNOSIS — Z0000 Encounter for general adult medical examination without abnormal findings: Secondary | ICD-10-CM

## 2017-06-20 DIAGNOSIS — E6609 Other obesity due to excess calories: Secondary | ICD-10-CM | POA: Diagnosis not present

## 2017-06-20 MED ORDER — MELOXICAM 7.5 MG PO TABS: 7.5000 mg | ORAL_TABLET | Freq: Every day | ORAL | 3 refills | Status: DC

## 2017-06-20 MED ORDER — FINASTERIDE 5 MG PO TABS: ORAL_TABLET | ORAL | 4 refills | Status: DC

## 2017-06-20 NOTE — Progress Notes (Signed)
Location:  [facility] clinic Provider:  Johnny Gorter L. Mariea Clonts, D.O., C.M.D.  Code Status: DNR--need to complete goldenrod form for him and scan Goals of Care:  Advanced Directives 06/20/2017  Does Patient Have a Medical Advance Directive? Yes  Type of Advance Directive [place]  Does patient want to make changes to medical advance directive? No - Patient declined  Copy of [place] in Chart? Yes  Would patient like information on creating a medical advance directive? -     Chief Complaint  Patient presents with  . Medical Management of Chronic Issues    follow-up    HPI: Patient is a 77 y.o. male seen today for medical management of chronic diseases.    BP has been running high the past few visits.  He is not taking the bystolic 38m, but is taking his amlodipine 520mpo daily and losartan/hctz.  Asks why he's on three different pills--discussed that the amlodipine at higher doses may cause edema.  Losartan is at max and he's already having urinary issues so more diuretic is illogical.  He seems to accept this and says he'll take the bystolic.  Bottom has been itchy--associates with too much fabric softener.  Is using hydrocortisone cream now for this and his groin with benefit.  Also helps the left inner thigh that gets irritated from the urine dripping down it.  Discussed that he should use sensitive/hypoallergenic detergent to avoid the problem on his buttocks and clean promptly to deal with the left thigh irritation.  Still having urge to urinate if he hears water running.  Has some leakage.  Also urinates, then has a little drip down his left leg after the stream ends.  Myrbetriq did not help.  Last took his finasteride yesterday.  Needs refill.    Reports new stiffness and pain in her hips, lower back in addition to his right shoulder pain.  I had increased his oxycodone to bid prn for the right shoulder pain if unrelieved with nsaids.  He is trying to  take the various nsaids for his pain on a schedule.  He says his pain is worse when he lays down.  Says he has otc sleeping pills b/c he cannot sleep well lately due to pain.  Says the ibuprofen did nothing in the middle of the night Friday night.  He says pain is stressing him out and then he's overeating.  We discussed mobic therapy for him as an antiinflammatory regularly.    Had left knee injection with Dr. DeMarlou San 06/06/17 which didn't really help.  Right knee injection previously did help.    He had an ESR and CRP.    He brings up his wife's chronic pain which is stressful for him.     Past Medical History:  Diagnosis Date  . Allergic rhinitis due to pollen   . Allergy   . Anxiety state, unspecified   . Arthritis    "right shoulder" (05/03/2012)  . Atrial flutter ([place]   a. dx after inguinal hernia repair in 10/12 => seen by Dr. NaAcie Fredrickson. Calculus of kidney   . Cervicalgia   . Cervicalgia   . Clostridium difficile colitis   . Complications affecting other specified body systems, hypertension   . Coronary atherosclerosis of unspecified type of vessel, native or graft   . Degeneration of cervical intervertebral disc   . Depression   . Depressive disorder, not elsewhere classified   . GERD (gastroesophageal reflux  disease)   . HTN (hypertension)   . Hx of echocardiogram    a. Echo 11/12:  Mod LVH, EF 55-60%, MAC, mod LAE, mild RAE  . Impotence of organic origin   . Inguinal hernia without mention of obstruction or gangrene, unilateral or unspecified, (not specified as recurrent)   . Insomnia, unspecified   . Intestinal infection due to Clostridium difficile   . Irritable bowel syndrome   . Kidney stone    "they just passed" (05/03/2012)  . Lumbago   . Nonspecific abnormal electrocardiogram (ECG) (EKG)   . Nonspecific abnormal results of pulmonary system function study   . Obesity, unspecified   . Osteoarthrosis, unspecified whether generalized or localized, unspecified site     . Other malaise and fatigue   . Other specified cardiac dysrhythmias(427.89)   . Pain in joint, ankle and foot   . Pain in joint, lower leg   . Pain in joint, pelvic region and thigh   . Pain in joint, shoulder region   . Partial deafness   . Rash and other nonspecific skin eruption   . Sacroiliitis, not elsewhere classified ([place])   . Sacroiliitis, not elsewhere classified ([place])   . Spasm of muscle   . Special screening for malignant neoplasm of prostate   . Unspecified constipation   . Unspecified hereditary and idiopathic peripheral neuropathy     Past Surgical History:  Procedure Laterality Date  . APPENDECTOMY  ~ 1956  . ATRIAL FLUTTER ABLATION N/A 05/03/2012   Procedure: ATRIAL FLUTTER ABLATION;  Surgeon: Evans Lance, MD;  Location: [facility] CATH LAB;  Service: Cardiovascular;  Laterality: N/A;  . CARDIAC CATHETERIZATION  08/1999; 05/03/2012   normal coronary arteries;   . CARPAL TUNNEL WITH CUBITAL TUNNEL  2004   "right" (05/03/2012)  . COLONOSCOPY  06/06/2008   severe sigmoid diverticulosis and internal hemorrhoids  . ESOPHAGOGASTRODUODENOSCOPY  06/06/2008   normal  . INGUINAL HERNIA REPAIR  03/23/11   "left" (05/03/2012)  . POSTERIOR LUMBAR FUSION  1989  . reconstruction (r) foot surgery     DR SUE   . RENAL CYST EXCISION     pt denies this hx on 05/03/2012  . [place]  . SPINE SURGERY  1969   Pantopaque Myelography and spinal infusion- Dr. Lyman Speller  . TONSILLECTOMY  ~ 1946  . TOTAL HIP ARTHROPLASTY  1999-2000   bilateral    Allergies  Allergen Reactions  . Amitriptyline   . Azithromycin     Stomach pain  . Flexeril [Cyclobenzaprine]     Causes dizziness    Outpatient Encounter Medications as of 06/20/2017  Medication Sig  . amLODipine (NORVASC) 5 MG tablet Take 1 tablet (5 mg total) daily by mouth.  Marland Kitchen aspirin EC 81 MG tablet Take 1 tablet (81 mg total) by mouth daily.  . cetirizine (ZYRTEC) 10 MG tablet Take 1 tablet (10 mg total) by mouth daily.  .  hydrOXYzine (ATARAX/VISTARIL) 25 MG tablet Take 1 tablet (25 mg total) by mouth every 6 (six) hours.  Marland Kitchen losartan-hydrochlorothiazide (HYZAAR) 100-25 MG tablet TAKE 1 TABLET BY MOUTH EVERY DAY  . mirabegron ER (MYRBETRIQ) 25 MG TB24 tablet Take 25 mg daily by mouth.  . nebivolol (BYSTOLIC) 5 MG tablet Take 5 mg by mouth daily.  . ondansetron (ZOFRAN) 4 MG tablet TAKE 1 TABLET BY MOUTH EVERY 8 HOURS AS NEEDED FOR NAUSEA  . oxyCODONE (ROXICODONE) 5 MG immediate release tablet Take one tablet by mouth twice daily as  needed for severe pain unrelieved with tylenol or ibuprofen  . [DISCONTINUED] finasteride (PROSCAR) 5 MG tablet One daily to treat prostate (Patient not taking: Reported on 06/20/2017)  . [DISCONTINUED] methocarbamol (ROBAXIN) 500 MG tablet Take 1 tablet (500 mg total) by mouth every 8 (eight) hours as needed for muscle spasms (in neck). (Patient not taking: Reported on 06/20/2017)  . [DISCONTINUED] nystatin (MYCOSTATIN) 100000 UNIT/ML suspension SWISH AND SPIT WITH 5 MILLILITERS BY MOUTH 4 TIMES A DAY (Patient not taking: Reported on 06/20/2017)   No facility-administered encounter medications on file as of 06/20/2017.     Review of Systems:  Review of Systems  Constitutional: Negative for chills, fever and malaise/fatigue.  HENT: Positive for hearing loss. Negative for congestion.   Eyes: Negative for blurred vision.  Respiratory: Negative for cough and shortness of breath.   Cardiovascular: Negative for chest pain, palpitations and leg swelling.  Gastrointestinal: Negative for abdominal pain, blood in stool, constipation and melena.  Genitourinary: Negative for dysuria.  Musculoskeletal: Positive for back pain, joint pain, myalgias and neck pain. Negative for falls.  Neurological: Negative for dizziness, loss of consciousness and weakness.  Endo/Heme/Allergies: Does not bruise/bleed easily.  Psychiatric/Behavioral: Negative for depression and memory loss. The patient is  nervous/anxious.        Caregiver stress    Health Maintenance  Topic Date Due  . TETANUS/TDAP  06/01/2021  . INFLUENZA VACCINE  Completed  . PNA vac Low Risk Adult  Completed    Physical Exam: Vitals:   06/20/17 1539  BP: (!) 150/82  Pulse: 72  Temp: (!) 97.5 F (36.4 C)  TempSrc: Oral  SpO2: 95%  Weight: 276 lb (125.2 kg)  Height: _0  (1.905 m)   Body mass index is 34.5 kg/m. Physical Exam  Constitutional: He is oriented to person, place, and time. He appears well-developed and well-nourished. No distress.  obese  Cardiovascular: Normal rate, regular rhythm, normal heart sounds and intact distal pulses.  Pulmonary/Chest: Effort normal and breath sounds normal. No respiratory distress.  Musculoskeletal:  Waddling gait  Neurological: He is alert and oriented to person, place, and time.  Skin: Skin is warm and dry.  Psychiatric: He has a normal mood and affect.    Labs reviewed: Basic Metabolic Panel: Recent Labs    07/23/16 1158  NA 140  K 4.3  CL 104  CO2 30  GLUCOSE 73  BUN 18  CREATININE 1.35*  CALCIUM 9.2   Liver Function Tests: Recent Labs    07/23/16 1158  AST 13  ALT 9  ALKPHOS 67  BILITOT 1.5*  PROT 6.0*  ALBUMIN 3.9   No results for input(s): LIPASE, AMYLASE in the last 8760 hours. No results for input(s): AMMONIA in the last 8760 hours. CBC: Recent Labs    06/06/17 1458  WBC 5.4  NEUTROABS 3,332  HGB 13.4  HCT 39.6  MCV 89.4  PLT 208   Lipid Panel: No results for input(s): CHOL, HDL, LDLCALC, TRIG, CHOLHDL, LDLDIRECT in the last 8760 hours. Lab Results  Component Value Date   HGBA1C (H) 08/10/2010    5.8 (NOTE)  According to the ADA Clinical Practice Recommendations for 2011, when HbA1c is used as a screening test:   >=6.5%   Diagnostic of Diabetes Mellitus           (if abnormal result  is confirmed)  5.7-6.4%   Increased risk of developing Diabetes  [place]  References:Diagnosis and Classification of Diabetes Mellitus,Diabetes XKGY,1856,31(SHFWY 1):S62-S69 and Standards of Medical Care in         Diabetes - 2011,Diabetes OVZC,5885,02  (Suppl 1):S11-S61.    Procedures since last visit: Ct Hip Left Wo Contrast  Result Date: 06/01/2017 CLINICAL DATA:  Chronic left hip pain.  Left total hip prosthesis. EXAM: CT OF THE LEFT HIP WITHOUT CONTRAST TECHNIQUE: Multidetector CT imaging of the left hip was performed according to the standard protocol. Multiplanar CT image reconstructions were also generated. COMPARISON:  Radiograph dated 08/09/2010 FINDINGS: Bones/Joint/Cartilage There is no loosening of the acetabular or femoral components of the left total hip prosthesis. However, there is prominent soft tissue around the proximal stem with slight erosion of the bone at the base of the stem best seen on the coronal and sagittal images. See image 45 of series 9 and image 57 of series 6. There is also a 2.5 cm cyst within the superior aspect of the left greater trochanter which appears to communicate with a soft tissue around the stem. Muscles and Tendons Small focal area of atrophy and dystrophic calcification rectus femoris muscle in the proximal thigh. IMPRESSION: 1. Abnormal soft tissue surrounds the proximal stem of the prosthesis with slight osteolysis of the adjacent femur. The abnormal soft tissue protrudes into the superior aspect of the left greater trochanter. 2. No evidence of loosening of the prosthesis components. Electronically Signed   By: Lorriane Shire M.D.   On: 06/01/2017 11:38    Assessment/Plan 1. Pain in joint of right shoulder - cont oxycodone 54m po bid prn nonrelief from tylenol,mobic which will be new for him: - meloxicam (MOBIC) 7.5 MG tablet; Take 1 tablet (7.5 mg total) by mouth daily.  Dispense: 90 tablet; Refill: 3--agreed to try after about 30 mins of discussion--is to send me a message in a week  2. Frequency of urination -  cont finasteride, stop myrbetriq if it's not helping - finasteride (PROSCAR) 5 MG tablet; One daily to treat prostate  Dispense: 90 tablet; Refill: 4  3. Bilateral hip pain - was to f/u with orthopedics for possible joint aspiration, but he had said at his wife's visit that this was better and he didn't need to f/u with Dr. DMarlou Sa now pain terrible bilaterally so he can only waddle to walk and he cannot sleep at night--story inconsistent - meloxicam (MOBIC) 7.5 MG tablet; Take 1 tablet (7.5 mg total) by mouth daily.  Dispense: 90 tablet; Refill: 3  4. Caregiver stress syndrome -is stressed about wife's bipolar and fibromyalgia--difficult to manage and she called his phone three times at least while he was being seen by me -not interested in medication for depression that might help  5. Essential hypertension -bp elevated b/c he's not taking his bystolic so restart it - CBC with Differential/Platelet; Future - COMPLETE METABOLIC PANEL WITH GFR; Future  6. Class 1 obesity due to excess calories without serious comorbidity with body mass index (BMI) of 33.0 to 33.9 in adult - ongoing, goes through spurts of exercise and diet that are short-lived then back to overeating and making poor choices - CBC with Differential/Platelet; Future - COMPLETE METABOLIC PANEL WITH GFR; Future -  Hemoglobin A1c; Future - Lipid panel; Future  Labs/tests ordered:   Orders Placed This Encounter  Procedures  . CBC with Differential/Platelet    Standing Status:   Future    Standing Expiration Date:   02/18/2018  . COMPLETE METABOLIC PANEL WITH GFR    Standing Status:   Future    Standing Expiration Date:   02/18/2018  . Hemoglobin A1c    Standing Status:   Future    Standing Expiration Date:   02/18/2018  . Lipid panel    Standing Status:   Future    Standing Expiration Date:   02/18/2018   40 mins spent counseling patient about risks of opioid therapy, benefits of alternative therapies and alternative  methods of pain relief, answering his questions   Next appt: 10/20/2017 med mgt, labs before  Vickee Mormino L. Geraldene Eisel, D.O. [facility] [address],  [address] Cell Phone (Mon-Fri 8am-5pm):  [phone] On Call:  [phone] & follow prompts after 5pm & weekends Office Phone:  [phone] Office Fax:  [phone]

## 2017-06-20 NOTE — Patient Instructions (Signed)
[name] [name] , Thank you for taking time to come for your Medicare Wellness Visit. I appreciate your ongoing commitment to your health goals. Please review the following plan we discussed and let me know if I can assist you in the future.   Screening recommendations/referrals: Colonoscopy excluded, you are over age 78 Recommended yearly ophthalmology/optometry visit for glaucoma screening and checkup Recommended yearly dental visit for hygiene and checkup  Vaccinations: Influenza vaccine up to date. Due 2019 fall season Pneumococcal vaccine up to date Tdap vaccine up to date. Due 06/01/2021 Shingles vaccine due, you are on the waiting list  Advanced directives: In Chart  Conditions/risks identified: none  Next appointment: Tyson Dense, RN 06/26/2018 @ 9:15am  Preventive Care 75 Years and Older, Male Preventive care refers to lifestyle choices and visits with your health care provider that can promote health and wellness. What does preventive care include?  A yearly physical exam. This is also called an annual well check.  Dental exams once or twice a year.  Routine eye exams. Ask your health care provider how often you should have your eyes checked.  Personal lifestyle choices, including:  Daily care of your teeth and gums.  Regular physical activity.  Eating a healthy diet.  Avoiding tobacco and drug use.  Limiting alcohol use.  Practicing safe sex.  Taking low doses of aspirin every day.  Taking vitamin and mineral supplements as recommended by your health care provider. What happens during an annual well check? The services and screenings done by your health care provider during your annual well check will depend on your age, overall health, lifestyle risk factors, and family history of disease. Counseling  Your health care provider may ask you questions about your:  Alcohol use.  Tobacco use.  Drug use.  Emotional well-being.  Home and relationship  well-being.  Sexual activity.  Eating habits.  History of falls.  Memory and ability to understand (cognition).  Work and work Statistician. Screening  You may have the following tests or measurements:  Height, weight, and BMI.  Blood pressure.  Lipid and cholesterol levels. These may be checked every 5 years, or more frequently if you are over 22 years old.  Skin check.  Lung cancer screening. You may have this screening every year starting at age 79 if you have a 30-pack-year history of smoking and currently smoke or have quit within the past 15 years.  Fecal occult blood test (FOBT) of the stool. You may have this test every year starting at age 82.  Flexible sigmoidoscopy or colonoscopy. You may have a sigmoidoscopy every 5 years or a colonoscopy every 10 years starting at age 81.  Prostate cancer screening. Recommendations will vary depending on your family history and other risks.  Hepatitis C blood test.  Hepatitis B blood test.  Sexually transmitted disease (STD) testing.  Diabetes screening. This is done by checking your blood sugar (glucose) after you have not eaten for a while (fasting). You may have this done every 1-3 years.  Abdominal aortic aneurysm (AAA) screening. You may need this if you are a current or former smoker.  Osteoporosis. You may be screened starting at age 38 if you are at high risk. Talk with your health care provider about your test results, treatment options, and if necessary, the need for more tests. Vaccines  Your health care provider may recommend certain vaccines, such as:  Influenza vaccine. This is recommended every year.  Tetanus, diphtheria, and acellular pertussis (Tdap, Td) vaccine.  You may need a Td booster every 10 years.  Zoster vaccine. You may need this after age 85.  Pneumococcal 13-valent conjugate (PCV13) vaccine. One dose is recommended after age 75.  Pneumococcal polysaccharide (PPSV23) vaccine. One dose is  recommended after age 44. Talk to your health care provider about which screenings and vaccines you need and how often you need them. This information is not intended to replace advice given to you by your health care provider. Make sure you discuss any questions you have with your health care provider. Document Released: 06/13/2015 Document Revised: 02/04/2016 Document Reviewed: 03/18/2015 Elsevier Interactive Patient Education  2017 [place] Prevention in the Home Falls can cause injuries. They can happen to people of all ages. There are many things you can do to make your home safe and to help prevent falls. What can I do on the outside of my home?  Regularly fix the edges of walkways and driveways and fix any cracks.  Remove anything that might make you trip as you walk through a door, such as a raised step or threshold.  Trim any bushes or trees on the path to your home.  Use bright outdoor lighting.  Clear any walking paths of anything that might make someone trip, such as rocks or tools.  Regularly check to see if handrails are loose or broken. Make sure that both sides of any steps have handrails.  Any raised decks and porches should have guardrails on the edges.  Have any leaves, snow, or ice cleared regularly.  Use sand or salt on walking paths during winter.  Clean up any spills in your garage right away. This includes oil or grease spills. What can I do in the bathroom?  Use night lights.  Install grab bars by the toilet and in the tub and shower. Do not use towel bars as grab bars.  Use non-skid mats or decals in the tub or shower.  If you need to sit down in the shower, use a plastic, non-slip stool.  Keep the floor dry. Clean up any water that spills on the floor as soon as it happens.  Remove soap buildup in the tub or shower regularly.  Attach bath mats securely with double-sided non-slip rug tape.  Do not have throw rugs and other things on  the floor that can make you trip. What can I do in the bedroom?  Use night lights.  Make sure that you have a light by your bed that is easy to reach.  Do not use any sheets or blankets that are too big for your bed. They should not hang down onto the floor.  Have a firm chair that has side arms. You can use this for support while you get dressed.  Do not have throw rugs and other things on the floor that can make you trip. What can I do in the kitchen?  Clean up any spills right away.  Avoid walking on wet floors.  Keep items that you use a lot in easy-to-reach places.  If you need to reach something above you, use a strong step stool that has a grab bar.  Keep electrical cords out of the way.  Do not use floor polish or wax that makes floors slippery. If you must use wax, use non-skid floor wax.  Do not have throw rugs and other things on the floor that can make you trip. What can I do with my stairs?  Do not leave any  items on the stairs.  Make sure that there are handrails on both sides of the stairs and use them. Fix handrails that are broken or loose. Make sure that handrails are as long as the stairways.  Check any carpeting to make sure that it is firmly attached to the stairs. Fix any carpet that is loose or worn.  Avoid having throw rugs at the top or bottom of the stairs. If you do have throw rugs, attach them to the floor with carpet tape.  Make sure that you have a light switch at the top of the stairs and the bottom of the stairs. If you do not have them, ask someone to add them for you. What else can I do to help prevent falls?  Wear shoes that:  Do not have high heels.  Have rubber bottoms.  Are comfortable and fit you well.  Are closed at the toe. Do not wear sandals.  If you use a stepladder:  Make sure that it is fully opened. Do not climb a closed stepladder.  Make sure that both sides of the stepladder are locked into place.  Ask someone to  hold it for you, if possible.  Clearly mark and make sure that you can see:  Any grab bars or handrails.  First and last steps.  Where the edge of each step is.  Use tools that help you move around (mobility aids) if they are needed. These include:  Canes.  Walkers.  Scooters.  Crutches.  Turn on the lights when you go into a dark area. Replace any light bulbs as soon as they burn out.  Set up your furniture so you have a clear path. Avoid moving your furniture around.  If any of your floors are uneven, fix them.  If there are any pets around you, be aware of where they are.  Review your medicines with your doctor. Some medicines can make you feel dizzy. This can increase your chance of falling. Ask your doctor what other things that you can do to help prevent falls. This information is not intended to replace advice given to you by your health care provider. Make sure you discuss any questions you have with your health care provider. Document Released: 03/13/2009 Document Revised: 10/23/2015 Document Reviewed: 06/21/2014 Elsevier Interactive Patient Education  2017 [employer].

## 2017-06-20 NOTE — Progress Notes (Signed)
Subjective:   [name] Prisk. is a 77 y.o. male who presents for Medicare Annual/Subsequent preventive examination.  Last AWV-05/18/2016       Objective:    Vitals: BP (!) 150/82 (BP Location: Left Arm, Patient Position: Sitting)   Pulse 72   Temp (!) 97.5 F (36.4 C) (Oral)   Ht 6' 3"  (1.905 m)   Wt 276 lb (125.2 kg)   SpO2 95%   BMI 34.50 kg/m   Body mass index is 34.5 kg/m.  Advanced Directives 06/20/2017 01/20/2017 12/09/2016 11/22/2016 11/08/2016 08/26/2016 08/17/2016  Does Patient Have a Medical Advance Directive? Yes Yes Yes Yes Yes Yes Yes  Type of [place] administrator Power of [place] of [place]  Does patient want to make changes to medical advance directive? No - Patient declined - - - - No - Patient declined -  Copy of [place] in Chart? Yes Yes Yes Yes Yes Yes No - copy requested  Would patient like information on creating a medical advance directive? - - - - - - -    Tobacco Social History   Tobacco Use  Smoking Status Former Smoker  . Packs/day: 2.00  . Years: 5.00  . Pack years: 10.00  . Types: Cigarettes  Smokeless Tobacco Never Used  Tobacco Comment   05/03/2012 "smoked from age 54 to 69"     Counseling given: Not Answered Comment: 05/03/2012 "smoked from age 22 to 3"   Clinical Intake:  Pre-visit preparation completed: No  Pain : No/denies pain     Nutritional Risks: None Diabetes: No  How often do you need to have someone help you when you read instructions, pamphlets, or other written materials from your doctor or pharmacy?: 1 - Never What is the last grade level you completed in school?: Masters  Interpreter Needed?: No  Information entered by :: Tyson Dense, RN  Past Medical History:  Diagnosis Date  . Allergic rhinitis due to pollen   .  Allergy   . Anxiety state, unspecified   . Arthritis    "right shoulder" (05/03/2012)  . Atrial flutter ([place])    a. dx after inguinal hernia repair in 10/12 => seen by Dr. Acie Fredrickson  . Calculus of kidney   . Cervicalgia   . Cervicalgia   . Clostridium difficile colitis   . Complications affecting other specified body systems, hypertension   . Coronary atherosclerosis of unspecified type of vessel, native or graft   . Degeneration of cervical intervertebral disc   . Depression   . Depressive disorder, not elsewhere classified   . GERD (gastroesophageal reflux disease)   . HTN (hypertension)   . Hx of echocardiogram    a. Echo 11/12:  Mod LVH, EF 55-60%, MAC, mod LAE, mild RAE  . Impotence of organic origin   . Inguinal hernia without mention of obstruction or gangrene, unilateral or unspecified, (not specified as recurrent)   . Insomnia, unspecified   . Intestinal infection due to Clostridium difficile   . Irritable bowel syndrome   . Kidney stone    "they just passed" (05/03/2012)  . Lumbago   . Nonspecific abnormal electrocardiogram (ECG) (EKG)   . Nonspecific abnormal results of pulmonary system function study   . Obesity, unspecified   . Osteoarthrosis, unspecified whether generalized or localized, unspecified site   . Other malaise and fatigue   .  Other specified cardiac dysrhythmias(427.89)   . Pain in joint, ankle and foot   . Pain in joint, lower leg   . Pain in joint, pelvic region and thigh   . Pain in joint, shoulder region   . Partial deafness   . Rash and other nonspecific skin eruption   . Sacroiliitis, not elsewhere classified ([place])   . Sacroiliitis, not elsewhere classified ([place])   . Spasm of muscle   . Special screening for malignant neoplasm of prostate   . Unspecified constipation   . Unspecified hereditary and idiopathic peripheral neuropathy    Past Surgical History:  Procedure Laterality Date  . APPENDECTOMY  ~ 1956  . ATRIAL FLUTTER ABLATION N/A  05/03/2012   Procedure: ATRIAL FLUTTER ABLATION;  Surgeon: Evans Lance, MD;  Location: [facility] CATH LAB;  Service: Cardiovascular;  Laterality: N/A;  . CARDIAC CATHETERIZATION  08/1999; 05/03/2012   normal coronary arteries;   . CARPAL TUNNEL WITH CUBITAL TUNNEL  2004   "right" (05/03/2012)  . COLONOSCOPY  06/06/2008   severe sigmoid diverticulosis and internal hemorrhoids  . ESOPHAGOGASTRODUODENOSCOPY  06/06/2008   normal  . INGUINAL HERNIA REPAIR  03/23/11   "left" (05/03/2012)  . POSTERIOR LUMBAR FUSION  1989  . reconstruction (r) foot surgery     DR SUE   . RENAL CYST EXCISION     pt denies this hx on 05/03/2012  . [place]  . SPINE SURGERY  1969   Pantopaque Myelography and spinal infusion- Dr. Lyman Speller  . TONSILLECTOMY  ~ 1946  . TOTAL HIP ARTHROPLASTY  1999-2000   bilateral   Family History  Problem Relation Age of Onset  . Hypertension Father   . Colon cancer Neg Hx    Social History   Socioeconomic History  . Marital status: Married    Spouse name: None  . Number of children: None  . Years of education: None  . Highest education level: None  Social Needs  . Financial resource strain: Not hard at all  . Food insecurity - worry: Never true  . Food insecurity - inability: Never true  . Transportation needs - medical: No  . Transportation needs - non-medical: No  Occupational History  . None  Tobacco Use  . Smoking status: Former Smoker    Packs/day: 2.00    Years: 5.00    Pack years: 10.00    Types: Cigarettes  . Smokeless tobacco: Never Used  . Tobacco comment: 05/03/2012 "smoked from age 31 to 26"  Substance and Sexual Activity  . Alcohol use: Yes    Comment: "1 margaritia a week"  . Drug use: No  . Sexual activity: Yes    Partners: Female  Other Topics Concern  . None  Social History Narrative   ** Merged History Encounter **        Outpatient Encounter Medications as of 06/20/2017  Medication Sig  . amLODipine (NORVASC) 5 MG tablet Take 1  tablet (5 mg total) daily by mouth.  Marland Kitchen aspirin EC 81 MG tablet Take 1 tablet (81 mg total) by mouth daily.  . cetirizine (ZYRTEC) 10 MG tablet Take 1 tablet (10 mg total) by mouth daily.  . hydrOXYzine (ATARAX/VISTARIL) 25 MG tablet Take 1 tablet (25 mg total) by mouth every 6 (six) hours.  Marland Kitchen losartan-hydrochlorothiazide (HYZAAR) 100-25 MG tablet TAKE 1 TABLET BY MOUTH EVERY DAY  . ondansetron (ZOFRAN) 4 MG tablet TAKE 1 TABLET BY MOUTH EVERY 8 HOURS AS NEEDED FOR NAUSEA  .  oxyCODONE (ROXICODONE) 5 MG immediate release tablet Take one tablet by mouth twice daily as needed for severe pain unrelieved with tylenol or ibuprofen  . finasteride (PROSCAR) 5 MG tablet One daily to treat prostate (Patient not taking: Reported on 06/20/2017)  . methocarbamol (ROBAXIN) 500 MG tablet Take 1 tablet (500 mg total) by mouth every 8 (eight) hours as needed for muscle spasms (in neck). (Patient not taking: Reported on 06/20/2017)  . mirabegron ER (MYRBETRIQ) 25 MG TB24 tablet Take 25 mg daily by mouth.  . nebivolol (BYSTOLIC) 5 MG tablet Take 5 mg by mouth daily.  Marland Kitchen nystatin (MYCOSTATIN) 100000 UNIT/ML suspension SWISH AND SPIT WITH 5 MILLILITERS BY MOUTH 4 TIMES A DAY (Patient not taking: Reported on 06/20/2017)  . [DISCONTINUED] methocarbamol (ROBAXIN) 500 MG tablet Take 1 tablet (500 mg total) by mouth 2 (two) times daily.   No facility-administered encounter medications on file as of 06/20/2017.     Activities of Daily Living In your present state of health, do you have any difficulty performing the following activities: 06/20/2017  Hearing? N  Vision? N  Difficulty concentrating or making decisions? N  Walking or climbing stairs? N  Dressing or bathing? N  Doing errands, shopping? N  Preparing Food and eating ? N  Using the Toilet? N  In the past six months, have you accidently leaked urine? N  Do you have problems with loss of bowel control? N  Managing your Medications? N  Managing your Finances? N    Housekeeping or managing your Housekeeping? N  Some recent data might be hidden    Patient Care Team: Gayland Curry, DO as PCP - General (Geriatric Medicine)   Assessment:   This is a routine wellness examination for [employer]  Exercise Activities and Dietary recommendations Current Exercise Habits: The patient does not participate in regular exercise at present, Exercise limited by: None identified  Goals    . Exercise 3x per week (30 min per time)     Pt will find a way to exercise throughout the week       Fall Risk Fall Risk  06/20/2017 01/20/2017 11/22/2016 05/18/2016 04/16/2016  Falls in the past year? Yes Yes No Yes No  Number falls in past yr: 2 or more 2 or more - 1 -  Injury with Fall? No No - Yes -  Comment - - - Pt fell in the tub; was taking an oatmeal bath for a rash and sprained left ankle.  -  Follow up - - - Falls prevention discussed -   Is the patient's home free of loose throw rugs in walkways, pet beds, electrical cords, etc?   yes      Grab bars in the bathroom? yes      Handrails on the stairs?   yes      Adequate lighting?   yes  Timed Get Up and Go Performed: 23 seconds, fall risk  Depression Screen PHQ 2/9 Scores 06/20/2017 01/20/2017 05/18/2016 04/16/2016  PHQ - 2 Score 1 0 2 0  PHQ- 9 Score - - 13 -    Cognitive Function MMSE - Mini Mental State Exam 05/18/2016 12/04/2014  Orientation to time 5 5  Orientation to Place 5 5  Registration 3 3  Attention/ Calculation 4 5  Recall 3 3  Language- name 2 objects 2 2  Language- repeat 1 1  Language- follow 3 step command 3 3  Language- read & follow direction 1 1  Write a sentence  1 1  Copy design 1 1  Total score 29 30        Immunization History  Administered Date(s) Administered  . Influenza, High Dose Seasonal PF 01/20/2017  . Influenza,inj,Quad PF,6+ Mos 02/20/2014, 02/04/2015, 01/27/2016  . Influenza-Unspecified 02/24/2010, 04/02/2011, 02/02/2012, 01/29/2013  . Pneumococcal  Conjugate-13 05/27/2015  . Pneumococcal Polysaccharide-23 05/27/2009  . Tdap 06/02/2011  . Zoster 06/01/2011    Qualifies for Shingles Vaccine? Yes, educated and on waiting list  Screening Tests Health Maintenance  Topic Date Due  . TETANUS/TDAP  06/01/2021  . INFLUENZA VACCINE  Completed  . PNA vac Low Risk Adult  Completed   Cancer Screenings: Lung: Low Dose CT Chest recommended if Age 31-80 years, 30 pack-year currently smoking OR have quit w/in 15years. Patient does not qualify. Colorectal: up to date  Additional Screenings:  Hepatitis B/HIV/Syphillis:declined Hepatitis C Screening: declined    Plan:    I have personally reviewed and addressed the Medicare Annual Wellness questionnaire and have noted the following in the patient's chart:  A. Medical and social history B. Use of alcohol, tobacco or illicit drugs  C. Current medications and supplements D. Functional ability and status E.  Nutritional status F.  Physical activity G. Advance directives H. List of other physicians I.  Hospitalizations, surgeries, and ER visits in previous 12 months J.  [place] to include hearing, vision, cognitive, depression L. Referrals and appointments - none  In addition, I have reviewed and discussed with patient certain preventive protocols, quality metrics, and best practice recommendations. A written personalized care plan for preventive services as well as general preventive health recommendations were provided to patient.  See attached scanned questionnaire for additional information.   Signed,   Tyson Dense, RN Nurse Health Advisor   Quick Notes   Health Maintenance: On Waiting list for Shingrix     Abnormal Screen: MMSE 30/30. Passed clock drawing 1st BP 150/82 2nd BP 152/80     Patient Concerns: none     Nurse Concerns: Stress level, noncompliance with medications

## 2017-06-20 NOTE — Patient Instructions (Addendum)
While on mobic (meloxicam), please avoid aleve/naproxen, ibuprofen/motrin/advil.      Restart the bystolic 5mg  daily for your blood pressure.

## 2017-06-29 DIAGNOSIS — M47816 Spondylosis without myelopathy or radiculopathy, lumbar region: Secondary | ICD-10-CM | POA: Diagnosis not present

## 2017-06-29 DIAGNOSIS — M9903 Segmental and somatic dysfunction of lumbar region: Secondary | ICD-10-CM | POA: Diagnosis not present

## 2017-07-01 DIAGNOSIS — L821 Other seborrheic keratosis: Secondary | ICD-10-CM | POA: Diagnosis not present

## 2017-07-01 DIAGNOSIS — L300 Nummular dermatitis: Secondary | ICD-10-CM | POA: Diagnosis not present

## 2017-07-01 DIAGNOSIS — L578 Other skin changes due to chronic exposure to nonionizing radiation: Secondary | ICD-10-CM | POA: Diagnosis not present

## 2017-07-07 ENCOUNTER — Encounter: Payer: Self-pay | Admitting: Internal Medicine

## 2017-07-12 ENCOUNTER — Ambulatory Visit (INDEPENDENT_AMBULATORY_CARE_PROVIDER_SITE_OTHER): Payer: Medicare Other | Admitting: Physical Medicine and Rehabilitation

## 2017-07-13 ENCOUNTER — Other Ambulatory Visit: Payer: Self-pay | Admitting: Internal Medicine

## 2017-07-15 ENCOUNTER — Other Ambulatory Visit: Payer: Self-pay | Admitting: *Deleted

## 2017-07-15 DIAGNOSIS — M25561 Pain in right knee: Secondary | ICD-10-CM

## 2017-07-15 DIAGNOSIS — M25562 Pain in left knee: Secondary | ICD-10-CM

## 2017-07-15 DIAGNOSIS — M25519 Pain in unspecified shoulder: Secondary | ICD-10-CM

## 2017-07-15 DIAGNOSIS — G8929 Other chronic pain: Secondary | ICD-10-CM

## 2017-07-15 MED ORDER — OXYCODONE HCL 5 MG PO TABS: ORAL_TABLET | ORAL | 0 refills | Status: DC

## 2017-07-15 NOTE — Telephone Encounter (Signed)
Patient wife called requesting refill on patient's medication.  [place] Database Verified.  Pharmacy Confirmed Pended Rx and sent to Dr. Mariea Clonts for approval.

## 2017-07-20 ENCOUNTER — Ambulatory Visit (INDEPENDENT_AMBULATORY_CARE_PROVIDER_SITE_OTHER): Payer: Medicare Other | Admitting: Nurse Practitioner

## 2017-07-20 ENCOUNTER — Encounter: Payer: Self-pay | Admitting: Nurse Practitioner

## 2017-07-20 VITALS — BP 142/84 | HR 82 | Temp 98.3°F | Ht 75.0 in | Wt 274.0 lb

## 2017-07-20 DIAGNOSIS — R5383 Other fatigue: Secondary | ICD-10-CM

## 2017-07-20 DIAGNOSIS — G4700 Insomnia, unspecified: Secondary | ICD-10-CM

## 2017-07-20 DIAGNOSIS — K5900 Constipation, unspecified: Secondary | ICD-10-CM | POA: Diagnosis not present

## 2017-07-20 DIAGNOSIS — M545 Low back pain, unspecified: Secondary | ICD-10-CM

## 2017-07-20 DIAGNOSIS — R110 Nausea: Secondary | ICD-10-CM

## 2017-07-20 DIAGNOSIS — N182 Chronic kidney disease, stage 2 (mild): Secondary | ICD-10-CM

## 2017-07-20 DIAGNOSIS — M542 Cervicalgia: Secondary | ICD-10-CM | POA: Diagnosis not present

## 2017-07-20 LAB — CBC WITH DIFFERENTIAL/PLATELET
Basophils Absolute: 32 cells/uL (ref 0–200)
Basophils Relative: 0.6 %
EOS PCT: 2.8 %
Eosinophils Absolute: 148 cells/uL (ref 15–500)
HCT: 36.1 % — ABNORMAL LOW (ref 38.5–50.0)
Hemoglobin: 12.3 g/dL — ABNORMAL LOW (ref 13.2–17.1)
LYMPHS ABS: 1012 {cells}/uL (ref 850–3900)
MCH: 30.8 pg (ref 27.0–33.0)
MCHC: 34.1 g/dL (ref 32.0–36.0)
MCV: 90.3 fL (ref 80.0–100.0)
MONOS PCT: 9.8 %
MPV: 10.5 fL (ref 7.5–12.5)
NEUTROS PCT: 67.7 %
Neutro Abs: 3588 cells/uL (ref 1500–7800)
Platelets: 168 10*3/uL (ref 140–400)
RBC: 4 10*6/uL — AB (ref 4.20–5.80)
RDW: 13.3 % (ref 11.0–15.0)
TOTAL LYMPHOCYTE: 19.1 %
WBC mixed population: 519 cells/uL (ref 200–950)
WBC: 5.3 10*3/uL (ref 3.8–10.8)

## 2017-07-20 LAB — COMPLETE METABOLIC PANEL WITH GFR
AG Ratio: 2.5 (calc) (ref 1.0–2.5)
ALBUMIN MSPROF: 4 g/dL (ref 3.6–5.1)
ALKALINE PHOSPHATASE (APISO): 64 U/L (ref 40–115)
ALT: 19 U/L (ref 9–46)
AST: 20 U/L (ref 10–35)
BILIRUBIN TOTAL: 1.1 mg/dL (ref 0.2–1.2)
BUN / CREAT RATIO: 18 (calc) (ref 6–22)
BUN: 28 mg/dL — AB (ref 7–25)
CHLORIDE: 106 mmol/L (ref 98–110)
CO2: 27 mmol/L (ref 20–32)
Calcium: 9.2 mg/dL (ref 8.6–10.3)
Creat: 1.53 mg/dL — ABNORMAL HIGH (ref 0.70–1.18)
GFR, Est African American: 50 mL/min/{1.73_m2} — ABNORMAL LOW (ref >=60)
GFR, Est Non African American: 43 mL/min/{1.73_m2} — ABNORMAL LOW (ref >=60)
GLUCOSE: 101 mg/dL — AB (ref 65–99)
Globulin: 1.6 g/dL (calc) — ABNORMAL LOW (ref 1.9–3.7)
POTASSIUM: 4.9 mmol/L (ref 3.5–5.3)
SODIUM: 141 mmol/L (ref 135–146)
Total Protein: 5.6 g/dL — ABNORMAL LOW (ref 6.1–8.1)

## 2017-07-20 LAB — LIPASE: LIPASE: 19 U/L (ref 7–60)

## 2017-07-20 LAB — TSH: TSH: 2.22 m[IU]/L (ref 0.40–4.50)

## 2017-07-20 LAB — AMYLASE: AMYLASE: 27 U/L (ref 21–101)

## 2017-07-20 NOTE — Patient Instructions (Signed)
To start melatonin 3 mg by mouth every night for sleep-- take routinely for sleep, this may take a few days to start working  Start miralax 17 gm daily in 8 oz fluid for constipation- take this every day until bowels are moving regularly, can take this every day if needed  To increase fluid intake- GOAL is 8- 8 oz glasses of water a day  To start mobic (Meloxicam) 7.5 mg by mouth daily for pain- this is an antiinflammatory that will help pain.   To keep follow up with Dr Mariea Clonts as scheduled, sooner if needed
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
## 2017-07-21 ENCOUNTER — Other Ambulatory Visit: Payer: Self-pay | Admitting: Nurse Practitioner

## 2017-07-21 ENCOUNTER — Other Ambulatory Visit: Payer: Self-pay

## 2017-07-21 ENCOUNTER — Ambulatory Visit: Payer: Self-pay | Admitting: Internal Medicine

## 2017-07-21 DIAGNOSIS — N183 Chronic kidney disease, stage 3 unspecified: Secondary | ICD-10-CM

## 2017-07-21 DIAGNOSIS — D649 Anemia, unspecified: Secondary | ICD-10-CM

## 2017-07-22 ENCOUNTER — Other Ambulatory Visit: Payer: Self-pay

## 2017-07-22 DIAGNOSIS — D649 Anemia, unspecified: Secondary | ICD-10-CM

## 2017-07-23 LAB — FECAL GLOBIN BY IMMUNOCHEMISTRY
FECAL GLOBIN RESULT:: NOT DETECTED
MICRO NUMBER: 90238898
SPECIMEN QUALITY: ADEQUATE

## 2017-08-04 ENCOUNTER — Other Ambulatory Visit: Payer: Self-pay

## 2017-08-05 ENCOUNTER — Telehealth (INDEPENDENT_AMBULATORY_CARE_PROVIDER_SITE_OTHER): Payer: Self-pay | Admitting: Orthopedic Surgery

## 2017-08-05 NOTE — Telephone Encounter (Signed)
Per Dr Marlou Sa, he does not need to see patient again. Would like to proceed with hip aspiration.

## 2017-08-05 NOTE — Telephone Encounter (Signed)
Left message for patient to call back to schedule.  °

## 2017-08-05 NOTE — Telephone Encounter (Signed)
Scheduled for 09/05/17 per patient request.

## 2017-08-08 ENCOUNTER — Encounter: Payer: Self-pay | Admitting: Internal Medicine

## 2017-08-09 ENCOUNTER — Other Ambulatory Visit: Payer: Self-pay | Admitting: Internal Medicine

## 2017-08-09 ENCOUNTER — Encounter: Payer: Self-pay | Admitting: Internal Medicine

## 2017-08-12 ENCOUNTER — Encounter: Payer: Self-pay | Admitting: *Deleted

## 2017-08-12 ENCOUNTER — Other Ambulatory Visit: Payer: Self-pay | Admitting: *Deleted

## 2017-08-12 DIAGNOSIS — Z79899 Other long term (current) drug therapy: Secondary | ICD-10-CM

## 2017-08-12 DIAGNOSIS — G8929 Other chronic pain: Secondary | ICD-10-CM

## 2017-08-12 DIAGNOSIS — M25561 Pain in right knee: Secondary | ICD-10-CM

## 2017-08-12 DIAGNOSIS — M25519 Pain in unspecified shoulder: Secondary | ICD-10-CM

## 2017-08-12 DIAGNOSIS — M25562 Pain in left knee: Secondary | ICD-10-CM

## 2017-08-12 MED ORDER — OXYCODONE HCL 5 MG PO TABS: ORAL_TABLET | ORAL | 0 refills | Status: DC

## 2017-08-12 NOTE — Telephone Encounter (Signed)
Patient wife called and requested refill [place] Verified LR 07/15/17 Pharmacy Confirmed Pended Rx and sent to Dr. Mariea Clonts for approval.

## 2017-08-12 NOTE — Telephone Encounter (Signed)
Rx sent.  In the future, [name] [name] should call for his own prescriptions when he needs them.  Thanks.

## 2017-08-16 ENCOUNTER — Encounter: Payer: Self-pay | Admitting: Internal Medicine

## 2017-08-24 ENCOUNTER — Telehealth: Payer: Self-pay | Admitting: *Deleted

## 2017-08-24 NOTE — Telephone Encounter (Signed)
Patient called requesting a referral to see a Cardiologist that he seen 4 years ago,  Dr. Lovena Le. Patient stated he is not having any problems just wants to see one on a regular basis due to history. Please Advise.

## 2017-08-25 NOTE — Telephone Encounter (Signed)
I'd prefer to see him for his appt in May first if he's having no new symptoms that are cardiac.

## 2017-08-25 NOTE — Telephone Encounter (Signed)
.  left message to have patient return my call.  

## 2017-08-26 NOTE — Telephone Encounter (Signed)
Left message on VM with results and asked pt to call to confirm he received this message.

## 2017-08-29 NOTE — Telephone Encounter (Signed)
Patient called back and will discuss at next OV

## 2017-08-30 DIAGNOSIS — R531 Weakness: Secondary | ICD-10-CM | POA: Diagnosis not present

## 2017-09-01 ENCOUNTER — Encounter: Payer: Self-pay | Admitting: Internal Medicine

## 2017-09-04 ENCOUNTER — Other Ambulatory Visit: Payer: Self-pay | Admitting: Internal Medicine

## 2017-09-05 ENCOUNTER — Telehealth: Payer: Self-pay | Admitting: *Deleted

## 2017-09-05 ENCOUNTER — Encounter (INDEPENDENT_AMBULATORY_CARE_PROVIDER_SITE_OTHER): Payer: Self-pay | Admitting: Physical Medicine and Rehabilitation

## 2017-09-05 ENCOUNTER — Ambulatory Visit (INDEPENDENT_AMBULATORY_CARE_PROVIDER_SITE_OTHER): Payer: Medicare Other

## 2017-09-05 ENCOUNTER — Ambulatory Visit (INDEPENDENT_AMBULATORY_CARE_PROVIDER_SITE_OTHER): Payer: Medicare Other | Admitting: Physical Medicine and Rehabilitation

## 2017-09-05 DIAGNOSIS — M25552 Pain in left hip: Secondary | ICD-10-CM

## 2017-09-05 DIAGNOSIS — Z96642 Presence of left artificial hip joint: Secondary | ICD-10-CM

## 2017-09-05 NOTE — Telephone Encounter (Signed)
Patient called and stated that he started a diet about 30 days of limiting his calories to 1000. Stated that since doing this he has been constipated. Stated he only goes about 1 x every 6 days. Stated he feels full. Stated that he has taken OTC Laxatives with no relief. Would like to know what you recommend. Please Advise.

## 2017-09-05 NOTE — Progress Notes (Signed)
[name] [name]. - 77 y.o. male MRN [id number]  Date of birth: 07-16-1940  Office Visit Note: Visit Date: 09/05/2017 PCP: Gayland Curry, DO Referred by: Gayland Curry, DO  Subjective: Chief Complaint  Patient presents with  . Left Hip - Pain   HPI: [name] [name] is a 77 year old, with chronic worsening left hip pain which is mostly anterior lateral pain without symmetrical and pain.  Has been followed by Dr. Marlou Sa who sends him here for hip aspiration.  Patient does have prior total hip arthroplasty.  There is a CT scan of the hip showing fluid collection around the area of the greater trochanter with cyst formation in this fluid collection congregate surrounding femoral neck component.   ROS Otherwise per HPI.  Assessment & Plan: Visit Diagnoses:  1. Pain in left hip   2. History of total hip arthroplasty, left     Plan: Findings:  We did obtain about 12 mL of yellow to orange serous joint fluid which was blood-tinged.  No gross pus.  Fluid was sent for evaluation.  Patient will follow-up with Dr. Marlou Sa.    Meds & Orders: No orders of the defined types were placed in this encounter.   Orders Placed This Encounter  Procedures  . Large Joint Inj: L hip joint  . Anaerobic and Aerobic Culture  . Gram stain  . XR C-ARM NO REPORT  . Cell count + diff,  w/ cryst-synvl fld    Follow-up: No follow-ups on file.   Procedures: Large Joint Inj: L hip joint on 09/05/2017 8:29 AM Indications: diagnostic evaluation and pain Details: 22 G Needle length (in): 4.0 in [place]. needle, fluoroscopy-guided anterior approach  Arthrogram: No  Aspirate: 10 mL blood-tinged (orange) Outcome: tolerated well, no immediate complications  Needle position from base to femoral head. Aspirate was obtained mid femoral neck. Procedure, treatment alternatives, risks and benefits explained, specific risks discussed. Consent was given by the patient. Immediately prior to procedure a time out was called to  verify the correct patient, procedure, equipment, support staff and site/side marked as required. Patient was prepped and draped in the usual sterile fashion.      No notes on file   Clinical History: CT OF THE LEFT HIP WITHOUT CONTRAST  TECHNIQUE: Multidetector CT imaging of the left hip was performed according to the standard protocol. Multiplanar CT image reconstructions were also generated.  COMPARISON:  Radiograph dated 08/09/2010  FINDINGS: Bones/Joint/Cartilage  There is no loosening of the acetabular or femoral components of the left total hip prosthesis. However, there is prominent soft tissue around the proximal stem with slight erosion of the bone at the base of the stem best seen on the coronal and sagittal images. See image 45 of series 9 and image 57 of series 6. There is also a 2.5 cm cyst within the superior aspect of the left greater trochanter which appears to communicate with a soft tissue around the stem.  Muscles and Tendons  Small focal area of atrophy and dystrophic calcification rectus femoris muscle in the proximal thigh.  IMPRESSION: 1. Abnormal soft tissue surrounds the proximal stem of the prosthesis with slight osteolysis of the adjacent femur. The abnormal soft tissue protrudes into the superior aspect of the left greater trochanter. 2. No evidence of loosening of the prosthesis components.   Electronically Signed   By: Lorriane Shire M.D.   On: 06/01/2017 11:38   He reports that he has quit smoking. His smoking use included cigarettes.  He has a 10.00 pack-year smoking history. He has never used smokeless tobacco. No results for input(s): HGBA1C, LABURIC in the last 8760 hours.  Objective:  VS:  HT:    WT:   BMI:     BP:   HR: bpm  TEMP: ( )  RESP:  Physical Exam  Musculoskeletal:  Examination of the hip does not show any sign of redness or induration or swelling externally.  Patient does have some pain with rotation of the  hip.  He has good distal strength.    Ortho Exam Imaging: Xr C-arm No Report  Result Date: 09/05/2017 Please see Notes or Procedures tab for imaging impression.   Past Medical/Family/Surgical/Social History: Medications & Allergies reviewed per EMR, new medications updated. Patient Active Problem List   Diagnosis Date Noted  . Seborrheic keratosis 08/17/2016  . Sprain of ankle 05/12/2016  . Rash and nonspecific skin eruption 01/06/2016  . Pain in both knees 11/25/2015  . Myalgia and myositis 11/25/2015  . Weak 11/25/2015  . Insomnia 09/03/2015  . Frequency of urination 05/27/2015  . Post herpetic Neuropathy 03/12/2015  . Depression, major, recurrent, moderate ([place]) 03/12/2015  . Stress at home 01/26/2015  . Pain in left foot 06/26/2014  . Corn of toe 01/09/2014  . Allergic rhinitis 10/01/2013  . Rotator cuff tear arthropathy of right shoulder 10/01/2013  . Pain in joint, shoulder region 01/02/2013  . HTN (hypertension)   . Partial deafness   . Obesity   . Hammertoe 10/04/2012  . CKD (chronic kidney disease) 03/22/2012  . Chronic constipation 11/13/2010   Past Medical History:  Diagnosis Date  . Allergic rhinitis due to pollen   . Allergy   . Anxiety state, unspecified   . Arthritis    "right shoulder" (05/03/2012)  . Atrial flutter ([place])    a. dx after inguinal hernia repair in 10/12 => seen by Dr. Acie Fredrickson  . Calculus of kidney   . Cervicalgia   . Cervicalgia   . Clostridium difficile colitis   . Complications affecting other specified body systems, hypertension   . Coronary atherosclerosis of unspecified type of vessel, native or graft   . Degeneration of cervical intervertebral disc   . Depression   . Depressive disorder, not elsewhere classified   . GERD (gastroesophageal reflux disease)   . HTN (hypertension)   . Hx of echocardiogram    a. Echo 11/12:  Mod LVH, EF 55-60%, MAC, mod LAE, mild RAE  . Impotence of organic origin   . Inguinal hernia without  mention of obstruction or gangrene, unilateral or unspecified, (not specified as recurrent)   . Insomnia, unspecified   . Intestinal infection due to Clostridium difficile   . Irritable bowel syndrome   . Kidney stone    "they just passed" (05/03/2012)  . Lumbago   . Nonspecific abnormal electrocardiogram (ECG) (EKG)   . Nonspecific abnormal results of pulmonary system function study   . Obesity, unspecified   . Osteoarthrosis, unspecified whether generalized or localized, unspecified site   . Other malaise and fatigue   . Other specified cardiac dysrhythmias(427.89)   . Pain in joint, ankle and foot   . Pain in joint, lower leg   . Pain in joint, pelvic region and thigh   . Pain in joint, shoulder region   . Partial deafness   . Rash and other nonspecific skin eruption   . Sacroiliitis, not elsewhere classified ([place])   . Sacroiliitis, not elsewhere classified ([place])   . Spasm  of muscle   . Special screening for malignant neoplasm of prostate   . Unspecified constipation   . Unspecified hereditary and idiopathic peripheral neuropathy    Family History  Problem Relation Age of Onset  . Hypertension Father   . Colon cancer Neg Hx    Past Surgical History:  Procedure Laterality Date  . APPENDECTOMY  ~ 1956  . ATRIAL FLUTTER ABLATION N/A 05/03/2012   Procedure: ATRIAL FLUTTER ABLATION;  Surgeon: Evans Lance, MD;  Location: [facility] CATH LAB;  Service: Cardiovascular;  Laterality: N/A;  . CARDIAC CATHETERIZATION  08/1999; 05/03/2012   normal coronary arteries;   . CARPAL TUNNEL WITH CUBITAL TUNNEL  2004   "right" (05/03/2012)  . COLONOSCOPY  06/06/2008   severe sigmoid diverticulosis and internal hemorrhoids  . ESOPHAGOGASTRODUODENOSCOPY  06/06/2008   normal  . INGUINAL HERNIA REPAIR  03/23/11   "left" (05/03/2012)  . POSTERIOR LUMBAR FUSION  1989  . reconstruction (r) foot surgery     DR SUE   . RENAL CYST EXCISION     pt denies this hx on 05/03/2012  . [place]  .  SPINE SURGERY  1969   Pantopaque Myelography and spinal infusion- Dr. Lyman Speller  . TONSILLECTOMY  ~ 1946  . TOTAL HIP ARTHROPLASTY  1999-2000   bilateral   Social History   Occupational History  . Not on file  Tobacco Use  . Smoking status: Former Smoker    Packs/day: 2.00    Years: 5.00    Pack years: 10.00    Types: Cigarettes  . Smokeless tobacco: Never Used  . Tobacco comment: 05/03/2012 "smoked from age 79 to 13"  Substance and Sexual Activity  . Alcohol use: Yes    Comment: "1 margaritia a week"  . Drug use: No  . Sexual activity: Yes    Partners: Female

## 2017-09-05 NOTE — Telephone Encounter (Signed)
What OTC laxatives is he taking?  He should be drinking 8 8oz glasses of water per day and staying active.  He also should try taking a few prunes each day or prune juice.  If these interventions and his otc laxatives are ineffective, I recommend he take miralax 1 dose in 8oz of water or juice every other day as needed for no bm.

## 2017-09-05 NOTE — Telephone Encounter (Signed)
Patient notified

## 2017-09-05 NOTE — Progress Notes (Signed)
.  Numeric Pain Rating Scale and Functional Assessment Average Pain 3   In the last MONTH (on 0-10 scale) has pain interfered with the following?  1. General activity like being  able to carry out your everyday physical activities such as walking, climbing stairs, carrying groceries, or moving a chair?  Rating(1)   +Driver, -BT, -Dye Allergies.  

## 2017-09-05 NOTE — Patient Instructions (Signed)

## 2017-09-06 DIAGNOSIS — N400 Enlarged prostate without lower urinary tract symptoms: Secondary | ICD-10-CM | POA: Diagnosis not present

## 2017-09-06 DIAGNOSIS — K5900 Constipation, unspecified: Secondary | ICD-10-CM | POA: Diagnosis not present

## 2017-09-06 DIAGNOSIS — I10 Essential (primary) hypertension: Secondary | ICD-10-CM | POA: Diagnosis not present

## 2017-09-06 DIAGNOSIS — Z8679 Personal history of other diseases of the circulatory system: Secondary | ICD-10-CM | POA: Diagnosis not present

## 2017-09-06 DIAGNOSIS — Z7689 Persons encountering health services in other specified circumstances: Secondary | ICD-10-CM | POA: Diagnosis not present

## 2017-09-06 LAB — SYNOVIAL CELL COUNT + DIFF, W/ CRYSTALS
Basophils, %: 0 %
EOSINOPHILS-SYNOVIAL: 1 % (ref 0–2)
Lymphocytes-Synovial Fld: 56 % (ref 0–74)
Monocyte/Macrophage: 15 % (ref 0–69)
NEUTROPHIL, SYNOVIAL: 28 % — AB (ref 0–24)
Synoviocytes, %: 0 % (ref 0–15)
WBC, SYNOVIAL: 1900 {cells}/uL — AB (ref <150)

## 2017-09-06 LAB — GRAM STAIN
MICRO NUMBER: 90429608
SPECIMEN QUALITY:: ADEQUATE

## 2017-09-07 NOTE — Progress Notes (Signed)
Please call patient with results. Thanks no infection

## 2017-09-08 ENCOUNTER — Other Ambulatory Visit: Payer: Self-pay | Admitting: *Deleted

## 2017-09-08 ENCOUNTER — Encounter: Payer: Self-pay | Admitting: Internal Medicine

## 2017-09-08 DIAGNOSIS — G8929 Other chronic pain: Secondary | ICD-10-CM

## 2017-09-08 DIAGNOSIS — M25519 Pain in unspecified shoulder: Secondary | ICD-10-CM

## 2017-09-08 DIAGNOSIS — M25562 Pain in left knee: Secondary | ICD-10-CM

## 2017-09-08 DIAGNOSIS — M25561 Pain in right knee: Secondary | ICD-10-CM

## 2017-09-08 MED ORDER — OXYCODONE HCL 5 MG PO TABS: ORAL_TABLET | ORAL | 0 refills | Status: DC

## 2017-09-08 NOTE — Telephone Encounter (Signed)
Patient called requesting refill on Narcotic NCCSRS Database Verified LR: 08/12/2017 Pharmacy Confirmed Pended Rx and sent to Dr. Mariea Clonts for approval.

## 2017-09-09 ENCOUNTER — Encounter: Payer: Self-pay | Admitting: Internal Medicine

## 2017-09-11 LAB — ANAEROBIC AND AEROBIC CULTURE
AER RESULT: NO GROWTH
GRAM STAIN:: NONE SEEN
MICRO NUMBER:: 90429615
MICRO NUMBER:: 90429616
SPECIMEN QUALITY: ADEQUATE
SPECIMEN QUALITY:: ADEQUATE

## 2017-09-19 ENCOUNTER — Ambulatory Visit (INDEPENDENT_AMBULATORY_CARE_PROVIDER_SITE_OTHER): Payer: Medicare Other | Admitting: Orthopedic Surgery

## 2017-09-19 ENCOUNTER — Encounter (INDEPENDENT_AMBULATORY_CARE_PROVIDER_SITE_OTHER): Payer: Self-pay | Admitting: Orthopedic Surgery

## 2017-09-19 DIAGNOSIS — M5410 Radiculopathy, site unspecified: Secondary | ICD-10-CM | POA: Diagnosis not present

## 2017-09-19 MED ORDER — GABAPENTIN 100 MG PO CAPS: 100.0000 mg | ORAL_CAPSULE | Freq: Three times a day (TID) | ORAL | 0 refills | Status: DC

## 2017-09-19 NOTE — Progress Notes (Signed)
Office Visit Note   Patient: [name] [name].           Date of Birth: 01/03/1941           MRN: [id number] Visit Date: 09/19/2017 Requested by: Gayland Curry, DO [place], [place] [id number] PCP: Gayland Curry, DO  Subjective: Chief Complaint  Patient presents with  . Left Hip - Pain    HPI: [name] [name] is a patient with left hip and back pain.  He had a left hip aspiration which did have some fluid aspirated.  Cultures were negative.  White count in that fluid was only 1900.  Left hip does not appear to be infected.  Plain radiographs do not show any significant asymmetric poly-wear although that would be a primary consideration for this patient who has a 77 year old total hip replacements in position.  [name] [name] denies any groin pain.  Currently he states that his pain is worse in the back and buttock region on the left-hand side.              ROS: All systems reviewed are negative as they relate to the chief complaint within the history of present illness.  Patient denies  fevers or chills.   Assessment & Plan: Visit Diagnoses:  1. Radicular leg pain     Plan: Impression is left hip pain which is not apparently an infected total hip replacement.  I think it is likely coming from his back.  Does have a history of back surgery in 1969.  This is been going on now for 5 months.  Plan MRI lumbar spine to evaluate left-sided radiculopathy.  Plain radiographs from earlier visit do show significant arthritis in the lumbar spine both in the facet joints and in the disc spaces.  Follow-Up Instructions: No follow-ups on file.   Orders:  Orders Placed This Encounter  Procedures  . MR Lumbar Spine w/o contrast   Meds ordered this encounter  Medications  . gabapentin (NEURONTIN) 100 MG capsule    Sig: Take 1 capsule (100 mg total) by mouth 3 (three) times daily.    Dispense:  60 capsule    Refill:  0      Procedures: No procedures performed   Clinical Data: No additional  findings.  Objective: Vital Signs: There were no vitals taken for this visit.  Physical Exam:   Constitutional: Patient appears well-developed HEENT:  Head: Normocephalic Eyes:EOM are normal Neck: Normal range of motion Cardiovascular: Normal rate Pulmonary/chest: Effort normal Neurologic: Patient is alert Skin: Skin is warm Psychiatric: Patient has normal mood and affect    Ortho Exam: Ortho exam demonstrates no groin pain with internal and external rotation of either leg.  Patient has good ankle dorsiflexion plantarflexion quad hamstring strength.  No real pain with hip flexion abduction or adduction.  Does have some mild tenderness in the trochanteric tenderness on the left compared to the right.  Not much pain with forward and lateral bending.  Skin intact in that left hip region.  Specialty Comments:  No specialty comments available.  Imaging: No results found.   PMFS History: Patient Active Problem List   Diagnosis Date Noted  . Seborrheic keratosis 08/17/2016  . Sprain of ankle 05/12/2016  . Rash and nonspecific skin eruption 01/06/2016  . Pain in both knees 11/25/2015  . Myalgia and myositis 11/25/2015  . Weak 11/25/2015  . Insomnia 09/03/2015  . Frequency of urination 05/27/2015  . Post herpetic Neuropathy 03/12/2015  .  Depression, major, recurrent, moderate ([place]) 03/12/2015  . Stress at home 01/26/2015  . Pain in left foot 06/26/2014  . Corn of toe 01/09/2014  . Allergic rhinitis 10/01/2013  . Rotator cuff tear arthropathy of right shoulder 10/01/2013  . Pain in joint, shoulder region 01/02/2013  . HTN (hypertension)   . Partial deafness   . Obesity   . Hammertoe 10/04/2012  . CKD (chronic kidney disease) 03/22/2012  . Chronic constipation 11/13/2010   Past Medical History:  Diagnosis Date  . Allergic rhinitis due to pollen   . Allergy   . Anxiety state, unspecified   . Arthritis    "right shoulder" (05/03/2012)  . Atrial flutter ([place])    a. dx  after inguinal hernia repair in 10/12 => seen by Dr. Acie Fredrickson  . Calculus of kidney   . Cervicalgia   . Cervicalgia   . Clostridium difficile colitis   . Complications affecting other specified body systems, hypertension   . Coronary atherosclerosis of unspecified type of vessel, native or graft   . Degeneration of cervical intervertebral disc   . Depression   . Depressive disorder, not elsewhere classified   . GERD (gastroesophageal reflux disease)   . HTN (hypertension)   . Hx of echocardiogram    a. Echo 11/12:  Mod LVH, EF 55-60%, MAC, mod LAE, mild RAE  . Impotence of organic origin   . Inguinal hernia without mention of obstruction or gangrene, unilateral or unspecified, (not specified as recurrent)   . Insomnia, unspecified   . Intestinal infection due to Clostridium difficile   . Irritable bowel syndrome   . Kidney stone    "they just passed" (05/03/2012)  . Lumbago   . Nonspecific abnormal electrocardiogram (ECG) (EKG)   . Nonspecific abnormal results of pulmonary system function study   . Obesity, unspecified   . Osteoarthrosis, unspecified whether generalized or localized, unspecified site   . Other malaise and fatigue   . Other specified cardiac dysrhythmias(427.89)   . Pain in joint, ankle and foot   . Pain in joint, lower leg   . Pain in joint, pelvic region and thigh   . Pain in joint, shoulder region   . Partial deafness   . Rash and other nonspecific skin eruption   . Sacroiliitis, not elsewhere classified ([place])   . Sacroiliitis, not elsewhere classified ([place])   . Spasm of muscle   . Special screening for malignant neoplasm of prostate   . Unspecified constipation   . Unspecified hereditary and idiopathic peripheral neuropathy     Family History  Problem Relation Age of Onset  . Hypertension Father   . Colon cancer Neg Hx     Past Surgical History:  Procedure Laterality Date  . APPENDECTOMY  ~ 1956  . ATRIAL FLUTTER ABLATION N/A 05/03/2012   Procedure:  ATRIAL FLUTTER ABLATION;  Surgeon: Evans Lance, MD;  Location: [facility] CATH LAB;  Service: Cardiovascular;  Laterality: N/A;  . CARDIAC CATHETERIZATION  08/1999; 05/03/2012   normal coronary arteries;   . CARPAL TUNNEL WITH CUBITAL TUNNEL  2004   "right" (05/03/2012)  . COLONOSCOPY  06/06/2008   severe sigmoid diverticulosis and internal hemorrhoids  . ESOPHAGOGASTRODUODENOSCOPY  06/06/2008   normal  . INGUINAL HERNIA REPAIR  03/23/11   "left" (05/03/2012)  . POSTERIOR LUMBAR FUSION  1989  . reconstruction (r) foot surgery     DR SUE   . RENAL CYST EXCISION     pt denies this hx on 05/03/2012  .  [place]  . SPINE SURGERY  1969   Pantopaque Myelography and spinal infusion- Dr. Lyman Speller  . TONSILLECTOMY  ~ 1946  . TOTAL HIP ARTHROPLASTY  1999-2000   bilateral   Social History   Occupational History  . Not on file  Tobacco Use  . Smoking status: Former Smoker    Packs/day: 2.00    Years: 5.00    Pack years: 10.00    Types: Cigarettes  . Smokeless tobacco: Never Used  . Tobacco comment: 05/03/2012 "smoked from age 29 to 30"  Substance and Sexual Activity  . Alcohol use: Yes    Comment: "1 margaritia a week"  . Drug use: No  . Sexual activity: Yes    Partners: Female

## 2017-09-21 DIAGNOSIS — M2041 Other hammer toe(s) (acquired), right foot: Secondary | ICD-10-CM | POA: Diagnosis not present

## 2017-09-21 DIAGNOSIS — M79671 Pain in right foot: Secondary | ICD-10-CM | POA: Diagnosis not present

## 2017-09-21 DIAGNOSIS — M2042 Other hammer toe(s) (acquired), left foot: Secondary | ICD-10-CM | POA: Diagnosis not present

## 2017-09-22 ENCOUNTER — Ambulatory Visit: Payer: Medicare Other | Admitting: Podiatry

## 2017-09-22 DIAGNOSIS — M545 Low back pain: Secondary | ICD-10-CM | POA: Diagnosis not present

## 2017-09-22 DIAGNOSIS — W19XXXA Unspecified fall, initial encounter: Secondary | ICD-10-CM | POA: Diagnosis not present

## 2017-09-26 ENCOUNTER — Other Ambulatory Visit: Payer: Self-pay

## 2017-09-26 ENCOUNTER — Encounter: Payer: Self-pay | Admitting: Nurse Practitioner

## 2017-09-26 ENCOUNTER — Ambulatory Visit (INDEPENDENT_AMBULATORY_CARE_PROVIDER_SITE_OTHER): Payer: Medicare Other | Admitting: Nurse Practitioner

## 2017-09-26 VITALS — BP 168/82 | HR 63 | Temp 98.5°F | Ht 75.0 in | Wt 267.0 lb

## 2017-09-26 DIAGNOSIS — M545 Low back pain, unspecified: Secondary | ICD-10-CM

## 2017-09-26 DIAGNOSIS — Z79899 Other long term (current) drug therapy: Secondary | ICD-10-CM

## 2017-09-26 DIAGNOSIS — D649 Anemia, unspecified: Secondary | ICD-10-CM

## 2017-09-26 DIAGNOSIS — Z6833 Body mass index (BMI) 33.0-33.9, adult: Secondary | ICD-10-CM

## 2017-09-26 DIAGNOSIS — M25519 Pain in unspecified shoulder: Secondary | ICD-10-CM

## 2017-09-26 DIAGNOSIS — M25561 Pain in right knee: Secondary | ICD-10-CM

## 2017-09-26 DIAGNOSIS — G4700 Insomnia, unspecified: Secondary | ICD-10-CM | POA: Diagnosis not present

## 2017-09-26 DIAGNOSIS — F419 Anxiety disorder, unspecified: Secondary | ICD-10-CM

## 2017-09-26 DIAGNOSIS — M25562 Pain in left knee: Secondary | ICD-10-CM

## 2017-09-26 DIAGNOSIS — R5383 Other fatigue: Secondary | ICD-10-CM | POA: Diagnosis not present

## 2017-09-26 DIAGNOSIS — F329 Major depressive disorder, single episode, unspecified: Secondary | ICD-10-CM | POA: Diagnosis not present

## 2017-09-26 DIAGNOSIS — E6609 Other obesity due to excess calories: Secondary | ICD-10-CM

## 2017-09-26 DIAGNOSIS — I10 Essential (primary) hypertension: Secondary | ICD-10-CM | POA: Diagnosis not present

## 2017-09-26 DIAGNOSIS — G8929 Other chronic pain: Secondary | ICD-10-CM

## 2017-09-26 DIAGNOSIS — F32A Depression, unspecified: Secondary | ICD-10-CM

## 2017-09-26 LAB — BASIC METABOLIC PANEL WITH GFR
BUN/Creatinine Ratio: 17 (calc) (ref 6–22)
BUN: 23 mg/dL (ref 7–25)
CALCIUM: 9.3 mg/dL (ref 8.6–10.3)
CO2: 31 mmol/L (ref 20–32)
Chloride: 105 mmol/L (ref 98–110)
Creat: 1.34 mg/dL — ABNORMAL HIGH (ref 0.70–1.18)
GFR, Est African American: 59 mL/min/{1.73_m2} — ABNORMAL LOW (ref >=60)
GFR, Est Non African American: 51 mL/min/{1.73_m2} — ABNORMAL LOW (ref >=60)
GLUCOSE: 96 mg/dL (ref 65–99)
POTASSIUM: 4.8 mmol/L (ref 3.5–5.3)
Sodium: 140 mmol/L (ref 135–146)

## 2017-09-26 LAB — CBC WITH DIFFERENTIAL/PLATELET
Basophils Absolute: 49 cells/uL (ref 0–200)
Basophils Relative: 1.4 %
EOS PCT: 6.6 %
Eosinophils Absolute: 231 cells/uL (ref 15–500)
HCT: 35 % — ABNORMAL LOW (ref 38.5–50.0)
Hemoglobin: 11.9 g/dL — ABNORMAL LOW (ref 13.2–17.1)
LYMPHS ABS: 802 {cells}/uL — AB (ref 850–3900)
MCH: 30.7 pg (ref 27.0–33.0)
MCHC: 34 g/dL (ref 32.0–36.0)
MCV: 90.2 fL (ref 80.0–100.0)
MONOS PCT: 13.8 %
MPV: 10.4 fL (ref 7.5–12.5)
NEUTROS PCT: 55.3 %
Neutro Abs: 1936 cells/uL (ref 1500–7800)
Platelets: 161 10*3/uL (ref 140–400)
RBC: 3.88 10*6/uL — AB (ref 4.20–5.80)
RDW: 13.6 % (ref 11.0–15.0)
TOTAL LYMPHOCYTE: 22.9 %
WBC mixed population: 483 cells/uL (ref 200–950)
WBC: 3.5 10*3/uL — AB (ref 3.8–10.8)

## 2017-09-26 MED ORDER — AMLODIPINE BESYLATE 10 MG PO TABS: 10.0000 mg | ORAL_TABLET | Freq: Every day | ORAL | 1 refills | Status: DC

## 2017-09-26 NOTE — Patient Instructions (Addendum)
Increase amlodipine to 10 mg daily for blood pressure bystolic removed from medication list.  To take blood pressure at home and write down and bring to next office visit. Make sure you have taken your medication when you take your blood pressure Make sure you have been sitting at least 5 mins when you take blood pressure   To take melatonin 3 mg at bedtime  STOP z-quil Bedtime routine.  To follow up with dietician to help with diet- lack of energy could have a lot to do with that.

## 2017-09-26 NOTE — Progress Notes (Signed)
Careteam: Patient Care Team: Gayland Curry, DO as PCP - General (Geriatric Medicine)  Advanced Directive information Does Patient Have a Medical Advance Directive?: Yes, Type of Advance Directive: [place], Does patient want to make changes to medical advance directive?: No - Patient declined  Allergies  Allergen Reactions  . Amitriptyline   . Azithromycin     Stomach pain  . Flexeril [Cyclobenzaprine]     Causes dizziness    Chief Complaint  Patient presents with  . Acute Visit    Pt is being seen due to not having any enegry for 3 to 4 month but pt reports that he stopped bystolic about a week ago and his energy increased.      HPI: Patient is a 77 y.o. male seen in the office today for lack of energy.  Pt reports he has stopped his bystolic and energy level immediately got better however now blood pressure is elevated.  Reports he stopped this 5-7 days.  Also noted to cut calories down to 800 calories a day then increased back to 1200, now at 1500-1600 at this time which has been going on for about at week.  Does not eat breakfast, eats a burger or club sandwich for lunch and then will eat another sandwich or spaghetti-os for dinner.  Does not eat fruit or vegetables.  Does not do any routine exercise.  Does not sleep well at night, taking z-quil at night to help sleep but gets up around 1-2 am then gets back to a light sleep.  Is effected by his wife's uncontrolled pain. Reports he can not help her and it stresses him out 24/7. Reports she is yelling "oh God, Oh God" and pt states "I can't handle it"  Using oxycodone ~1 daily for chronic shoulder and low back pain.  Reports he can not afford PT.   Review of Systems:  Review of Systems  Constitutional: Negative for chills and fever.  HENT: Positive for hearing loss.        HOH in left ear  Respiratory: Negative.   Cardiovascular: Negative for chest pain, palpitations and leg swelling.    Gastrointestinal: Negative for abdominal pain, blood in stool, constipation, heartburn, melena, nausea and vomiting.  Genitourinary: Negative.   Musculoskeletal: Positive for back pain, joint pain and neck pain. Negative for falls.  Skin: Negative.   Neurological: Negative for dizziness and weakness.  Psychiatric/Behavioral: Positive for depression. The patient is nervous/anxious and has insomnia.    Past Medical History:  Diagnosis Date  . Allergic rhinitis due to pollen   . Allergy   . Anxiety state, unspecified   . Arthritis    "right shoulder" (05/03/2012)  . Atrial flutter ([place])    a. dx after inguinal hernia repair in 10/12 => seen by Dr. Acie Fredrickson  . Calculus of kidney   . Cervicalgia   . Cervicalgia   . Clostridium difficile colitis   . Complications affecting other specified body systems, hypertension   . Coronary atherosclerosis of unspecified type of vessel, native or graft   . Degeneration of cervical intervertebral disc   . Depression   . Depressive disorder, not elsewhere classified   . GERD (gastroesophageal reflux disease)   . HTN (hypertension)   . Hx of echocardiogram    a. Echo 11/12:  Mod LVH, EF 55-60%, MAC, mod LAE, mild RAE  . Impotence of organic origin   . Inguinal hernia without mention of obstruction or gangrene, unilateral or unspecified, (  not specified as recurrent)   . Insomnia, unspecified   . Intestinal infection due to Clostridium difficile   . Irritable bowel syndrome   . Kidney stone    "they just passed" (05/03/2012)  . Lumbago   . Nonspecific abnormal electrocardiogram (ECG) (EKG)   . Nonspecific abnormal results of pulmonary system function study   . Obesity, unspecified   . Osteoarthrosis, unspecified whether generalized or localized, unspecified site   . Other malaise and fatigue   . Other specified cardiac dysrhythmias(427.89)   . Pain in joint, ankle and foot   . Pain in joint, lower leg   . Pain in joint, pelvic region and thigh    . Pain in joint, shoulder region   . Partial deafness   . Rash and other nonspecific skin eruption   . Sacroiliitis, not elsewhere classified ([place])   . Sacroiliitis, not elsewhere classified ([place])   . Spasm of muscle   . Special screening for malignant neoplasm of prostate   . Unspecified constipation   . Unspecified hereditary and idiopathic peripheral neuropathy    Past Surgical History:  Procedure Laterality Date  . APPENDECTOMY  ~ 1956  . ATRIAL FLUTTER ABLATION N/A 05/03/2012   Procedure: ATRIAL FLUTTER ABLATION;  Surgeon: Evans Lance, MD;  Location: [facility] CATH LAB;  Service: Cardiovascular;  Laterality: N/A;  . CARDIAC CATHETERIZATION  08/1999; 05/03/2012   normal coronary arteries;   . CARPAL TUNNEL WITH CUBITAL TUNNEL  2004   "right" (05/03/2012)  . COLONOSCOPY  06/06/2008   severe sigmoid diverticulosis and internal hemorrhoids  . ESOPHAGOGASTRODUODENOSCOPY  06/06/2008   normal  . INGUINAL HERNIA REPAIR  03/23/11   "left" (05/03/2012)  . POSTERIOR LUMBAR FUSION  1989  . reconstruction (r) foot surgery     DR SUE   . RENAL CYST EXCISION     pt denies this hx on 05/03/2012  . [place]  . SPINE SURGERY  1969   Pantopaque Myelography and spinal infusion- Dr. Lyman Speller  . TONSILLECTOMY  ~ 1946  . TOTAL HIP ARTHROPLASTY  1999-2000   bilateral   Social History:   reports that he has quit smoking. His smoking use included cigarettes. He has a 10.00 pack-year smoking history. He has never used smokeless tobacco. He reports that he drinks alcohol. He reports that he does not use drugs.  Family History  Problem Relation Age of Onset  . Hypertension Father   . Colon cancer Neg Hx     Medications: Patient's Medications  New Prescriptions   No medications on file  Previous Medications   AMLODIPINE (NORVASC) 5 MG TABLET    Take 1 tablet (5 mg total) daily by mouth.   ASPIRIN EC 81 MG TABLET    Take 1 tablet (81 mg total) by mouth daily.   CETIRIZINE (ZYRTEC) 10 MG  TABLET    Take 1 tablet (10 mg total) by mouth daily.   FINASTERIDE (PROSCAR) 5 MG TABLET    One daily to treat prostate   GABAPENTIN (NEURONTIN) 100 MG CAPSULE    Take 1 capsule (100 mg total) by mouth 3 (three) times daily.   HYDROXYZINE (ATARAX/VISTARIL) 25 MG TABLET    Take 1 tablet (25 mg total) by mouth every 6 (six) hours.   LOSARTAN-HYDROCHLOROTHIAZIDE (HYZAAR) 100-25 MG TABLET    TAKE 1 TABLET BY MOUTH EVERY DAY   MELOXICAM (MOBIC) 7.5 MG TABLET    Take 1 tablet (7.5 mg total) by mouth daily.  NEBIVOLOL (BYSTOLIC) 5 MG TABLET    Take 5 mg by mouth daily.   ONDANSETRON (ZOFRAN) 4 MG TABLET    TAKE 1 TABLET BY MOUTH EVERY 8 HOURS AS NEEDED FOR NAUSEA   OXYCODONE (ROXICODONE) 5 MG IMMEDIATE RELEASE TABLET    Take one tablet by mouth twice daily as needed for severe pain unrelieved with tylenol or ibuprofen   TRIAMCINOLONE CREAM (KENALOG) 0.1 %    APPLY TO AFFECTED AREA 2 TO 3 TIMES DAILY FOR 2 WEEKS AS NEEDED  Modified Medications   No medications on file  Discontinued Medications   No medications on file     Physical Exam:  Vitals:   09/26/17 0938  BP: (!) 168/82  Pulse: 63  Temp: 98.5 F (36.9 C)  TempSrc: Oral  SpO2: 99%  Weight: 267 lb (121.1 kg)  Height: 6' 3"  (1.905 m)   Body mass index is 33.37 kg/m.  Physical Exam  Constitutional: He is oriented to person, place, and time. He appears well-developed and well-nourished. No distress.  HENT:  Head: Normocephalic and atraumatic.  Mouth/Throat: Oropharynx is clear and moist. No oropharyngeal exudate.  Eyes: Pupils are equal, round, and reactive to light. Conjunctivae and EOM are normal.  Neck: Normal range of motion. Neck supple.  Cardiovascular: Normal rate, regular rhythm and normal heart sounds.  Pulmonary/Chest: Effort normal and breath sounds normal.  Abdominal: Soft. Bowel sounds are normal.  Musculoskeletal: He exhibits no edema or tenderness.  Neurological: He is alert and oriented to person, place, and  time.  Skin: Skin is warm and dry. He is not diaphoretic. There is pallor.  Psychiatric: He has a normal mood and affect.    Labs reviewed: Basic Metabolic Panel: Recent Labs    07/20/17 1015  NA 141  K 4.9  CL 106  CO2 27  GLUCOSE 101*  BUN 28*  CREATININE 1.53*  CALCIUM 9.2  TSH 2.22   Liver Function Tests: Recent Labs    07/20/17 1015  AST 20  ALT 19  BILITOT 1.1  PROT 5.6*   Recent Labs    07/20/17 1015  LIPASE 19  AMYLASE 27   No results for input(s): AMMONIA in the last 8760 hours. CBC: Recent Labs    06/06/17 1458 07/20/17 1015  WBC 5.4 5.3  NEUTROABS 3,332 3,588  HGB 13.4 12.3*  HCT 39.6 36.1*  MCV 89.4 90.3  PLT 208 168   Lipid Panel: No results for input(s): CHOL, HDL, LDLCALC, TRIG, CHOLHDL, LDLDIRECT in the last 8760 hours. TSH: Recent Labs    07/20/17 1015  TSH 2.22   A1C: Lab Results  Component Value Date   HGBA1C (H) 08/10/2010    5.8 (NOTE)                                                                       According to the ADA Clinical Practice Recommendations for 2011, when HbA1c is used as a screening test:   >=6.5%   Diagnostic of Diabetes Mellitus           (if abnormal result  is confirmed)  5.7-6.4%   Increased risk of developing Diabetes Mellitus  References:Diagnosis and Classification of Diabetes Mellitus,Diabetes XKGY,1856,31(SHFWY 1):S62-S69 and Standards of Medical Care in  Diabetes - 2011,Diabetes Care,2011,34  (Suppl 1):S11-S61.     Assessment/Plan 1. Fatigue, unspecified type -seems to be multi-factorial. hgb was low in February and did not follow up CBC, will obtain this today. Stool was negative.   - not eating correctly to attempts to lose weight, agreeable to nutritionist referral. Amb ref to Medical Nutrition Therapy-MNT - CBC with Differential/Platelet - BASIC METABOLIC PANEL WITH GFR -remains on oxycodone as well will get Drug Tox Monitor 1 w/Conf, Oral Fld -also discouraged use of z-quil which  is benadryl and can caused increase fatigue  2. Essential hypertension -elevated today after stopping bystolic, repeat similar. Reports he has white coat syndrome so encouraged to take blood pressure out of office and record.  Will icnrease norvasc to 10 mg daily at this time.  - amLODipine (NORVASC) 10 MG tablet; Take 1 tablet (10 mg total) by mouth daily.  Dispense: 90 tablet; Refill: 1  3. Class 1 obesity due to excess calories without serious comorbidity with body mass index (BMI) of 33.0 to 33.9 in adult -cutting calories in attempts to lose weight but does not eat appropriately. Reports he has hundreds of hand outs from appts. At this time would benefit from a in person meeting and education  - Amb ref to Medical Nutrition Therapy-MNT  4. Anxiety and depression -reports increase anxiety and depression however medications for this (SSRI, SNRI) have not "worked well with him"  In the past. Information given for counseling center at this time.   5. Anemia, unspecified type To follow up CBC, no signs of acute blood loss.   6. Lumbar pain -chronic, continues on oxycodone PRN, will get drug screen today. Reports he is only using this ~once daily.   7. Insomnia To use melatonin 3 mg by mouth daily at bedtime routinely and to stop z-quil. encouraged to increase activity during the day, bedtime routine, avoiding naps.   Next appt: as scheduled with Dr Mariea Clonts. 10/17/2017 Carlos American. [place], [facility] [phone]

## 2017-09-27 ENCOUNTER — Other Ambulatory Visit: Payer: Medicare Other

## 2017-09-28 ENCOUNTER — Ambulatory Visit (INDEPENDENT_AMBULATORY_CARE_PROVIDER_SITE_OTHER): Payer: Self-pay | Admitting: Orthopedic Surgery

## 2017-09-28 LAB — DRUG TOX MONITOR 1 W/CONF, ORAL FLD
Amphetamines: NEGATIVE ng/mL (ref <10)
Barbiturates: NEGATIVE ng/mL (ref <10)
Benzodiazepines: NEGATIVE ng/mL (ref <0.50)
Buprenorphine: NEGATIVE ng/mL (ref <0.10)
Cocaine: NEGATIVE ng/mL (ref <5.0)
Codeine: NEGATIVE ng/mL (ref <2.5)
Dihydrocodeine: NEGATIVE ng/mL (ref <2.5)
Fentanyl: NEGATIVE ng/mL (ref <0.10)
Heroin Metabolite: NEGATIVE ng/mL (ref <1.0)
Hydrocodone: NEGATIVE ng/mL (ref <2.5)
Hydromorphone: NEGATIVE ng/mL (ref <2.5)
MARIJUANA: NEGATIVE ng/mL (ref <2.5)
MDMA: NEGATIVE ng/mL (ref <10)
Meprobamate: NEGATIVE ng/mL (ref <2.5)
Methadone: NEGATIVE ng/mL (ref <5.0)
Morphine: NEGATIVE ng/mL (ref <2.5)
Nicotine Metabolite: NEGATIVE ng/mL (ref <5.0)
Norhydrocodone: NEGATIVE ng/mL (ref <2.5)
Noroxycodone: 17.2 ng/mL — ABNORMAL HIGH (ref <2.5)
Opiates: POSITIVE ng/mL — AB (ref <2.5)
Oxycodone: >250 ng/mL — ABNORMAL HIGH (ref <2.5)
Oxymorphone: NEGATIVE ng/mL (ref <2.5)
Phencyclidine: NEGATIVE ng/mL (ref <10)
Tapentadol: NEGATIVE ng/mL (ref <5.0)
Tramadol: NEGATIVE ng/mL (ref <5.0)
Zolpidem: NEGATIVE ng/mL (ref <5.0)

## 2017-09-28 LAB — TEST AUTHORIZATION

## 2017-09-28 LAB — IRON,TIBC AND FERRITIN PANEL
%SAT: 24 % (calc) (ref 15–60)
Ferritin: 512 ng/mL — ABNORMAL HIGH (ref 20–380)
IRON: 57 ug/dL (ref 50–180)
TIBC: 241 mcg/dL (calc) — ABNORMAL LOW (ref 250–425)

## 2017-10-08 ENCOUNTER — Encounter: Payer: Self-pay | Admitting: Podiatry

## 2017-10-11 ENCOUNTER — Inpatient Hospital Stay: Admission: RE | Admit: 2017-10-11 | Payer: Medicare Other | Source: Ambulatory Visit

## 2017-10-11 ENCOUNTER — Other Ambulatory Visit: Payer: Self-pay | Admitting: *Deleted

## 2017-10-11 DIAGNOSIS — M25561 Pain in right knee: Secondary | ICD-10-CM

## 2017-10-11 DIAGNOSIS — G8929 Other chronic pain: Secondary | ICD-10-CM

## 2017-10-11 DIAGNOSIS — M25562 Pain in left knee: Secondary | ICD-10-CM

## 2017-10-11 DIAGNOSIS — M25519 Pain in unspecified shoulder: Secondary | ICD-10-CM

## 2017-10-11 NOTE — Telephone Encounter (Signed)
Request for refill NCCSRS Database Verified LR: 09/11/2017 Pharmacy Confirmed Pended Rx and sent to Dr. Mariea Clonts for approval.

## 2017-10-12 MED ORDER — OXYCODONE HCL 5 MG PO TABS
ORAL_TABLET | ORAL | 0 refills | Status: DC
Start: 2017-10-12 — End: 2019-03-09

## 2017-10-12 NOTE — Addendum Note (Signed)
Addended by: Gayland Curry on: 10/12/2017 03:51 PM   Modules accepted: Orders

## 2017-10-12 NOTE — Telephone Encounter (Signed)
I do see where he actually does have an appt with me 5/23.  I will give him enough oxycodone to get to the appt.     For the record only, I am concerned he is giving his pain meds to his wife since hers was denied due to not coming in as scheduled.  He is enabling her addiction.  He had been doing fine on only tylenol or ibuprofen.

## 2017-10-12 NOTE — Telephone Encounter (Signed)
Patient just had an appointment with Janett Billow on 09/26/2017.

## 2017-10-17 ENCOUNTER — Other Ambulatory Visit: Payer: Medicare Other

## 2017-10-17 DIAGNOSIS — R739 Hyperglycemia, unspecified: Secondary | ICD-10-CM | POA: Diagnosis not present

## 2017-10-17 DIAGNOSIS — I10 Essential (primary) hypertension: Secondary | ICD-10-CM

## 2017-10-17 DIAGNOSIS — E6609 Other obesity due to excess calories: Secondary | ICD-10-CM

## 2017-10-17 DIAGNOSIS — Z6833 Body mass index (BMI) 33.0-33.9, adult: Principal | ICD-10-CM

## 2017-10-18 LAB — CBC WITH DIFFERENTIAL/PLATELET
Basophils Absolute: 42 cells/uL (ref 0–200)
Basophils Relative: 0.9 %
Eosinophils Absolute: 212 cells/uL (ref 15–500)
Eosinophils Relative: 4.5 %
HCT: 35.4 % — ABNORMAL LOW (ref 38.5–50.0)
Hemoglobin: 12 g/dL — ABNORMAL LOW (ref 13.2–17.1)
Lymphs Abs: 1203 cells/uL (ref 850–3900)
MCH: 30.7 pg (ref 27.0–33.0)
MCHC: 33.9 g/dL (ref 32.0–36.0)
MCV: 90.5 fL (ref 80.0–100.0)
MPV: 10.3 fL (ref 7.5–12.5)
Monocytes Relative: 12.4 %
Neutro Abs: 2660 cells/uL (ref 1500–7800)
Neutrophils Relative %: 56.6 %
Platelets: 186 10*3/uL (ref 140–400)
RBC: 3.91 10*6/uL — ABNORMAL LOW (ref 4.20–5.80)
RDW: 13.5 % (ref 11.0–15.0)
Total Lymphocyte: 25.6 %
WBC mixed population: 583 cells/uL (ref 200–950)
WBC: 4.7 10*3/uL (ref 3.8–10.8)

## 2017-10-18 LAB — COMPLETE METABOLIC PANEL WITH GFR
AG Ratio: 1.8 (calc) (ref 1.0–2.5)
ALT: 12 U/L (ref 9–46)
AST: 18 U/L (ref 10–35)
Albumin: 3.9 g/dL (ref 3.6–5.1)
Alkaline phosphatase (APISO): 77 U/L (ref 40–115)
BUN/Creatinine Ratio: 19 (calc) (ref 6–22)
BUN: 25 mg/dL (ref 7–25)
CO2: 29 mmol/L (ref 20–32)
Calcium: 9.4 mg/dL (ref 8.6–10.3)
Chloride: 106 mmol/L (ref 98–110)
Creat: 1.29 mg/dL — ABNORMAL HIGH (ref 0.70–1.18)
GFR, Est African American: 62 mL/min/{1.73_m2} (ref >=60)
GFR, Est Non African American: 53 mL/min/{1.73_m2} — ABNORMAL LOW (ref >=60)
Globulin: 2.2 g/dL (calc) (ref 1.9–3.7)
Glucose, Bld: 91 mg/dL (ref 65–99)
Potassium: 4.1 mmol/L (ref 3.5–5.3)
Sodium: 141 mmol/L (ref 135–146)
Total Bilirubin: 1 mg/dL (ref 0.2–1.2)
Total Protein: 6.1 g/dL (ref 6.1–8.1)

## 2017-10-18 LAB — LIPID PANEL
Cholesterol: 163 mg/dL (ref <200)
HDL: 45 mg/dL (ref >40)
LDL Cholesterol (Calc): 99 mg/dL (calc)
Non-HDL Cholesterol (Calc): 118 mg/dL (calc) (ref <130)
Total CHOL/HDL Ratio: 3.6 (calc) (ref <5.0)
Triglycerides: 97 mg/dL (ref <150)

## 2017-10-18 LAB — HEMOGLOBIN A1C
Hgb A1c MFr Bld: 5.3 % of total Hgb (ref <5.7)
Mean Plasma Glucose: 105 (calc)
eAG (mmol/L): 5.8 (calc)

## 2017-10-20 ENCOUNTER — Encounter: Payer: Self-pay | Admitting: Internal Medicine

## 2017-10-20 ENCOUNTER — Ambulatory Visit (INDEPENDENT_AMBULATORY_CARE_PROVIDER_SITE_OTHER): Payer: Medicare Other | Admitting: Internal Medicine

## 2017-10-20 VITALS — BP 140/70 | HR 53 | Temp 98.0°F | Ht 75.0 in | Wt 267.0 lb

## 2017-10-20 DIAGNOSIS — N522 Drug-induced erectile dysfunction: Secondary | ICD-10-CM

## 2017-10-20 DIAGNOSIS — G8929 Other chronic pain: Secondary | ICD-10-CM

## 2017-10-20 DIAGNOSIS — I10 Essential (primary) hypertension: Secondary | ICD-10-CM

## 2017-10-20 DIAGNOSIS — M25552 Pain in left hip: Secondary | ICD-10-CM

## 2017-10-20 DIAGNOSIS — M159 Polyosteoarthritis, unspecified: Secondary | ICD-10-CM

## 2017-10-20 DIAGNOSIS — E6609 Other obesity due to excess calories: Secondary | ICD-10-CM | POA: Diagnosis not present

## 2017-10-20 DIAGNOSIS — M75101 Unspecified rotator cuff tear or rupture of right shoulder, not specified as traumatic: Secondary | ICD-10-CM | POA: Diagnosis not present

## 2017-10-20 DIAGNOSIS — Z6833 Body mass index (BMI) 33.0-33.9, adult: Secondary | ICD-10-CM

## 2017-10-20 DIAGNOSIS — M12811 Other specific arthropathies, not elsewhere classified, right shoulder: Secondary | ICD-10-CM | POA: Diagnosis not present
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
## 2017-10-22 ENCOUNTER — Ambulatory Visit
Admission: RE | Admit: 2017-10-22 | Discharge: 2017-10-22 | Disposition: A | Discharge status: Home / Self Care | Payer: Medicare Other | Source: Ambulatory Visit | Attending: Orthopedic Surgery | Admitting: Orthopedic Surgery

## 2017-10-22 DIAGNOSIS — M5410 Radiculopathy, site unspecified: Secondary | ICD-10-CM

## 2017-10-22 DIAGNOSIS — M48061 Spinal stenosis, lumbar region without neurogenic claudication: Secondary | ICD-10-CM | POA: Diagnosis not present

## 2017-10-27 ENCOUNTER — Telehealth (INDEPENDENT_AMBULATORY_CARE_PROVIDER_SITE_OTHER): Payer: Self-pay | Admitting: Orthopedic Surgery

## 2017-10-27 DIAGNOSIS — M5410 Radiculopathy, site unspecified: Secondary | ICD-10-CM

## 2017-10-27 NOTE — Telephone Encounter (Signed)
Patient called asking for the results of his MRI that was taken the other day. He said it's best to get in touch with him late afternoon and you can leave a voicemail if he doesn't pick up. CB # [phone]

## 2017-10-27 NOTE — Telephone Encounter (Signed)
Please review and advise. Thanks.  

## 2017-10-28 NOTE — Telephone Encounter (Signed)
6/25 is our next available appt.

## 2017-10-28 NOTE — Telephone Encounter (Signed)
Courtney/Tonisha what is the soonest opening Dr Ernestina Patches has for [facility]?   Gabriel Cirri, do you know how far out [place] is scheduling?

## 2017-10-28 NOTE — Telephone Encounter (Signed)
Please refer to Dr. Ernestina Patches or [facility] imaging whoever can get him in the fastest for lumbar spine injections

## 2017-10-29 ENCOUNTER — Other Ambulatory Visit: Payer: Medicare Other

## 2017-10-31 NOTE — Telephone Encounter (Signed)
Put order in. Thanks.

## 2017-10-31 NOTE — Addendum Note (Signed)
Addended byLaurann Montana on: 10/31/2017 08:14 AM   Modules accepted: Orders

## 2017-10-31 NOTE — Telephone Encounter (Signed)
Contacted Roberta with [facility], she will contact pt to schedule

## 2017-10-31 NOTE — Telephone Encounter (Signed)
[facility] has some openings next week, please put in order.

## 2017-11-01 ENCOUNTER — Telehealth: Payer: Self-pay

## 2017-11-01 NOTE — Telephone Encounter (Signed)
Again, there is no reason for him to have two PCPs.  He has spent much of his visits requesting pain medications for his wife and previously violated his opioid contract.  These are grounds for discharge.  I will discuss with our office manager when I'm back in the office.

## 2017-11-01 NOTE — Telephone Encounter (Signed)
Patient has established care with [facility] for primary care. Pt was seen as new patient on 09/06/17.

## 2017-11-03 ENCOUNTER — Telehealth (INDEPENDENT_AMBULATORY_CARE_PROVIDER_SITE_OTHER): Payer: Self-pay | Admitting: Orthopedic Surgery

## 2017-11-03 NOTE — Telephone Encounter (Signed)
Patient states he was referred to [facility] for a back injection and he had to cancel. Patient would like more information on the injection (side effect, how long he will be out or stop activities) etc.

## 2017-11-04 NOTE — Telephone Encounter (Signed)
I called patient, had at length conversation advising that likely should be able to resume activities as tolerated the day following the injection.

## 2017-11-07 ENCOUNTER — Other Ambulatory Visit: Payer: Medicare Other

## 2017-11-14 ENCOUNTER — Other Ambulatory Visit: Payer: Medicare Other

## 2017-11-21 ENCOUNTER — Inpatient Hospital Stay: Admission: RE | Admit: 2017-11-21 | Payer: Medicare Other | Source: Ambulatory Visit

## 2017-11-22 ENCOUNTER — Ambulatory Visit
Admission: RE | Admit: 2017-11-22 | Discharge: 2017-11-22 | Disposition: A | Discharge status: Home / Self Care | Payer: Medicare Other | Source: Ambulatory Visit | Attending: Orthopedic Surgery | Admitting: Orthopedic Surgery

## 2017-11-22 DIAGNOSIS — M5410 Radiculopathy, site unspecified: Secondary | ICD-10-CM

## 2017-11-22 MED ORDER — METHYLPREDNISOLONE ACETATE 40 MG/ML INJ SUSP (RADIOLOG
120.0000 mg | Freq: Once | INTRAMUSCULAR | Status: AC
  Administered 2017-11-22: 120 mg via EPIDURAL

## 2017-11-22 MED ORDER — IOPAMIDOL (ISOVUE-M 200) INJECTION 41%
1.0000 mL | Freq: Once | INTRAMUSCULAR | Status: AC
  Administered 2017-11-22: 1 mL via EPIDURAL

## 2017-11-22 NOTE — Discharge Instructions (Signed)

## 2017-12-07 ENCOUNTER — Telehealth (INDEPENDENT_AMBULATORY_CARE_PROVIDER_SITE_OTHER): Payer: Self-pay | Admitting: Orthopedic Surgery

## 2017-12-07 NOTE — Telephone Encounter (Signed)
Please refer to [facility] for surgical evaluation thanks

## 2017-12-07 NOTE — Telephone Encounter (Signed)
Please advise. Thanks.  

## 2017-12-07 NOTE — Telephone Encounter (Signed)
Patient said injection he had in his lower back/spine from [facility] imaging about a month ago did not help at all and would like to know what the next steps are. Patients # [phone]

## 2017-12-07 NOTE — Telephone Encounter (Signed)
I tried calling patient to discuss. No answer. LMVM advising him per Dr Marlou Sa. Asked him to please call me back to let me know how he wished to proceed.

## 2017-12-08 ENCOUNTER — Encounter: Payer: Self-pay | Admitting: Podiatry

## 2017-12-08 ENCOUNTER — Ambulatory Visit: Payer: Medicare Other | Admitting: Podiatry

## 2017-12-08 ENCOUNTER — Ambulatory Visit (INDEPENDENT_AMBULATORY_CARE_PROVIDER_SITE_OTHER): Payer: Medicare Other

## 2017-12-08 DIAGNOSIS — M2041 Other hammer toe(s) (acquired), right foot: Secondary | ICD-10-CM

## 2017-12-08 DIAGNOSIS — Q828 Other specified congenital malformations of skin: Secondary | ICD-10-CM | POA: Diagnosis not present

## 2017-12-08 NOTE — Progress Notes (Signed)
Subjective:   Patient ID: [name] Camp., male   DOB: 77 y.o.   MRN: [id number]   HPI Patient states this corn has come back I still have the hammertoe and the padding lost its ability to hold up my toe.  Overall I done pretty well but I am not sure if I should have surgery   ROS      Objective:  Physical Exam  Neurovascular status is intact with patient found to have contracted third digit right with distal keratotic lesion on the toe secondary to the structural position of the digit     Assessment:  Hammer toe deformity with keratotic lesion digit 3 right distal secondary to the structural alignment of the toe     Plan:  H&P x-ray reviewed condition discussed we discussed again surgical intervention for digital procedure.  Patient will hold off currently and this may become necessary we will get a try to see if we can just get by with debridement which was accomplished today and new buttress pad with instructions on the types of shoes that would be most effective.  Reappoint for [place] to recheck  X-ray indicates significant contracture of the toe with no indications of other pathology

## 2018-01-26 ENCOUNTER — Encounter: Payer: Self-pay | Admitting: *Deleted

## 2018-03-09 ENCOUNTER — Ambulatory Visit: Payer: Medicare Other | Admitting: Internal Medicine

## 2018-05-11 ENCOUNTER — Other Ambulatory Visit: Payer: Self-pay | Admitting: Podiatry

## 2018-05-11 ENCOUNTER — Encounter: Payer: Self-pay | Admitting: Podiatry

## 2018-05-11 ENCOUNTER — Ambulatory Visit: Payer: Medicare Other | Admitting: Podiatry

## 2018-05-11 ENCOUNTER — Ambulatory Visit (INDEPENDENT_AMBULATORY_CARE_PROVIDER_SITE_OTHER): Payer: Medicare Other

## 2018-05-11 DIAGNOSIS — M7661 Achilles tendinitis, right leg: Secondary | ICD-10-CM

## 2018-05-11 DIAGNOSIS — M79671 Pain in right foot: Secondary | ICD-10-CM

## 2018-05-11 NOTE — Progress Notes (Signed)
Subjective:   Patient ID: [name] [name]., male   DOB: 77 y.o.   MRN: [id number]   HPI Patient states he traumatized his right heel 3 weeks ago and is remained very sore especially when he puts his shoe on and he has to drive.  States it occurred against the bedpost at nighttime   ROS      Objective:  Physical Exam  Neurovascular status unchanged negative Homans sign noted with patient having strength of his Achilles tendon right and left.  Patient was found to have inflammation pain of the posterior heel right with fluid buildup noted     Assessment:  Acute Achilles tendinitis right secondary to injury     Plan:  H&P x-rays reviewed and precautionary I did x-ray the foot.  I then went ahead recommended air fracture walker to completely immobilize and take pressure off and he denies wanting this and is placed in surgical shoe along with ice therapy which was explained to him.  Reappoint if symptoms do not improve early and if not we will see him back 4 weeks  X-rays indicate that there is no signs of fracture of the calcaneus or other bone pathology

## 2018-05-16 ENCOUNTER — Ambulatory Visit (INDEPENDENT_AMBULATORY_CARE_PROVIDER_SITE_OTHER): Payer: Self-pay

## 2018-05-16 ENCOUNTER — Ambulatory Visit (INDEPENDENT_AMBULATORY_CARE_PROVIDER_SITE_OTHER): Payer: Medicare Other | Admitting: Orthopedic Surgery

## 2018-05-16 ENCOUNTER — Encounter (INDEPENDENT_AMBULATORY_CARE_PROVIDER_SITE_OTHER): Payer: Self-pay | Admitting: Orthopedic Surgery

## 2018-05-16 DIAGNOSIS — M25562 Pain in left knee: Secondary | ICD-10-CM | POA: Diagnosis not present

## 2018-05-16 DIAGNOSIS — M1712 Unilateral primary osteoarthritis, left knee: Secondary | ICD-10-CM

## 2018-05-16 DIAGNOSIS — M1711 Unilateral primary osteoarthritis, right knee: Secondary | ICD-10-CM | POA: Diagnosis not present

## 2018-05-17 ENCOUNTER — Ambulatory Visit (INDEPENDENT_AMBULATORY_CARE_PROVIDER_SITE_OTHER): Payer: Medicare Other | Admitting: Orthopedic Surgery

## 2018-05-21 ENCOUNTER — Encounter (INDEPENDENT_AMBULATORY_CARE_PROVIDER_SITE_OTHER): Payer: Self-pay | Admitting: Orthopedic Surgery

## 2018-05-21 DIAGNOSIS — M1712 Unilateral primary osteoarthritis, left knee: Secondary | ICD-10-CM | POA: Diagnosis not present

## 2018-05-21 DIAGNOSIS — M1711 Unilateral primary osteoarthritis, right knee: Secondary | ICD-10-CM

## 2018-05-21 MED ORDER — LIDOCAINE HCL 1 % IJ SOLN
5.0000 mL | INTRAMUSCULAR | Status: AC | PRN
  Administered 2018-05-21: 5 mL

## 2018-05-21 MED ORDER — BUPIVACAINE HCL 0.25 % IJ SOLN
4.0000 mL | INTRAMUSCULAR | Status: AC | PRN
  Administered 2018-05-21: 4 mL via INTRA_ARTICULAR

## 2018-05-21 MED ORDER — METHYLPREDNISOLONE ACETATE 40 MG/ML IJ SUSP
40.0000 mg | INTRAMUSCULAR | Status: AC | PRN
  Administered 2018-05-21: 40 mg via INTRA_ARTICULAR

## 2018-05-21 NOTE — Progress Notes (Signed)
Office Visit Note   Patient: [name] [name].           Date of Birth: 1940-12-29           MRN: [id number] Visit Date: 05/16/2018 Requested by: No referring provider defined for this encounter. PCP: Patient, No Pcp Per  Subjective: Chief Complaint  Patient presents with  . Right Knee - Pain  . Left Knee - Pain    HPI: Patient presents with bilateral knee pain left worse than right.  Pain is been increasing over the last 6 months.  Ice helps the symptoms.  States it is hard for him to climb stairs.  Takes oxycodone which is an ongoing prescription.  He lives in a [place] house.  His wife is at home.  Patient does not have blood diabetes and is not on a blood thinner.  Does have history of bilateral hip replacements in the year 2000.  Injections have helped him in the past for 5 to 6 months.              ROS: All systems reviewed are negative as they relate to the chief complaint within the history of present illness.  Patient denies  fevers or chills.   Assessment & Plan: Visit Diagnoses:  1. Left knee pain, unspecified chronicity   2. Unilateral primary osteoarthritis, left knee   3. Unilateral primary osteoarthritis, right knee     Plan: Impression is bilateral knee arthritis.  Not really a great candidate at this time for knee replacement based on improvement in symptoms from injection.  Plan is open patella knee sleeve and aspiration and injection of both knees.  I will see him back as needed  Follow-Up Instructions: Return if symptoms worsen or fail to improve.   Orders:  Orders Placed This Encounter  Procedures  . XR Knee 1-2 Views Left   No orders of the defined types were placed in this encounter.     Procedures: Large Joint Inj: bilateral knee on 05/21/2018 9:55 AM Indications: diagnostic evaluation, joint swelling and pain Details: 18 G 1.5 in needle, superolateral approach  Arthrogram: No  Medications (Right): 5 mL lidocaine 1 %; 4 mL bupivacaine 0.25  %; 40 mg methylPREDNISolone acetate 40 MG/ML Medications (Left): 5 mL lidocaine 1 %; 4 mL bupivacaine 0.25 %; 40 mg methylPREDNISolone acetate 40 MG/ML Outcome: tolerated well, no immediate complications Procedure, treatment alternatives, risks and benefits explained, specific risks discussed. Consent was given by the patient. Immediately prior to procedure a time out was called to verify the correct patient, procedure, equipment, support staff and site/side marked as required. Patient was prepped and draped in the usual sterile fashion.       Clinical Data: No additional findings.  Objective: Vital Signs: There were no vitals taken for this visit.  Physical Exam:   Constitutional: Patient appears well-developed HEENT:  Head: Normocephalic Eyes:EOM are normal Neck: Normal range of motion Cardiovascular: Normal rate Pulmonary/chest: Effort normal Neurologic: Patient is alert Skin: Skin is warm Psychiatric: Patient has normal mood and affect    Ortho Exam: Ortho exam demonstrates full active and passive range of motion of the ankles with no pain with range of motion of the hips.  His knees have slight flexion contracture but can bend past 90 degrees.  Does have mild effusion in both knees.  Slight varus alignment noted.  Has medial and lateral joint line tenderness bilaterally with intact extensor mechanism.  Specialty Comments:  No specialty comments available.  Imaging: No results found.   PMFS History: Patient Active Problem List   Diagnosis Date Noted  . Seborrheic keratosis 08/17/2016  . Sprain of ankle 05/12/2016  . Rash and nonspecific skin eruption 01/06/2016  . Pain in both knees 11/25/2015  . Myalgia and myositis 11/25/2015  . Weak 11/25/2015  . Insomnia 09/03/2015  . Frequency of urination 05/27/2015  . Post herpetic Neuropathy 03/12/2015  . Depression, major, recurrent, moderate ([place]) 03/12/2015  . Stress at home 01/26/2015  . Pain in left foot  06/26/2014  . Corn of toe 01/09/2014  . Allergic rhinitis 10/01/2013  . Rotator cuff tear arthropathy of right shoulder 10/01/2013  . Pain in joint, shoulder region 01/02/2013  . HTN (hypertension)   . Partial deafness   . Obesity   . Hammertoe 10/04/2012  . CKD (chronic kidney disease) 03/22/2012  . Chronic constipation 11/13/2010   Past Medical History:  Diagnosis Date  . Allergic rhinitis due to pollen   . Allergy   . Anxiety state, unspecified   . Arthritis    "right shoulder" (05/03/2012)  . Atrial flutter ([place])    a. dx after inguinal hernia repair in 10/12 => seen by Dr. Acie Fredrickson  . Calculus of kidney   . Cervicalgia   . Cervicalgia   . Clostridium difficile colitis   . Complications affecting other specified body systems, hypertension   . Coronary atherosclerosis of unspecified type of vessel, native or graft   . Degeneration of cervical intervertebral disc   . Depression   . Depressive disorder, not elsewhere classified   . GERD (gastroesophageal reflux disease)   . HTN (hypertension)   . Hx of echocardiogram    a. Echo 11/12:  Mod LVH, EF 55-60%, MAC, mod LAE, mild RAE  . Impotence of organic origin   . Inguinal hernia without mention of obstruction or gangrene, unilateral or unspecified, (not specified as recurrent)   . Insomnia, unspecified   . Intestinal infection due to Clostridium difficile   . Irritable bowel syndrome   . Kidney stone    "they just passed" (05/03/2012)  . Lumbago   . Nonspecific abnormal electrocardiogram (ECG) (EKG)   . Nonspecific abnormal results of pulmonary system function study   . Obesity, unspecified   . Osteoarthrosis, unspecified whether generalized or localized, unspecified site   . Other malaise and fatigue   . Other specified cardiac dysrhythmias(427.89)   . Pain in joint, ankle and foot   . Pain in joint, lower leg   . Pain in joint, pelvic region and thigh   . Pain in joint, shoulder region   . Partial deafness   . Rash  and other nonspecific skin eruption   . Sacroiliitis, not elsewhere classified ([place])   . Sacroiliitis, not elsewhere classified ([place])   . Spasm of muscle   . Special screening for malignant neoplasm of prostate   . Unspecified constipation   . Unspecified hereditary and idiopathic peripheral neuropathy     Family History  Problem Relation Age of Onset  . Hypertension Father   . Colon cancer Neg Hx     Past Surgical History:  Procedure Laterality Date  . APPENDECTOMY  ~ 1956  . ATRIAL FLUTTER ABLATION N/A 05/03/2012   Procedure: ATRIAL FLUTTER ABLATION;  Surgeon: Evans Lance, MD;  Location: [facility] CATH LAB;  Service: Cardiovascular;  Laterality: N/A;  . CARDIAC CATHETERIZATION  08/1999; 05/03/2012   normal coronary arteries;   . CARPAL TUNNEL WITH CUBITAL TUNNEL  2004   "  right" (05/03/2012)  . COLONOSCOPY  06/06/2008   severe sigmoid diverticulosis and internal hemorrhoids  . ESOPHAGOGASTRODUODENOSCOPY  06/06/2008   normal  . INGUINAL HERNIA REPAIR  03/23/11   "left" (05/03/2012)  . POSTERIOR LUMBAR FUSION  1989  . reconstruction (r) foot surgery     DR SUE   . RENAL CYST EXCISION     pt denies this hx on 05/03/2012  . [place]  . SPINE SURGERY  1969   Pantopaque Myelography and spinal infusion- Dr. Lyman Speller  . TONSILLECTOMY  ~ 1946  . TOTAL HIP ARTHROPLASTY  1999-2000   bilateral   Social History   Occupational History  . Not on file  Tobacco Use  . Smoking status: Former Smoker    Packs/day: 2.00    Years: 5.00    Pack years: 10.00    Types: Cigarettes  . Smokeless tobacco: Never Used  . Tobacco comment: 05/03/2012 "smoked from age 80 to 78"  Substance and Sexual Activity  . Alcohol use: Yes    Comment: "1 margaritia a week"  . Drug use: No  . Sexual activity: Yes    Partners: Female

## 2018-06-08 ENCOUNTER — Ambulatory Visit: Payer: Medicare Other | Admitting: Podiatry

## 2018-06-23 ENCOUNTER — Other Ambulatory Visit: Payer: Self-pay | Admitting: Internal Medicine

## 2018-06-25 ENCOUNTER — Encounter (HOSPITAL_COMMUNITY): Payer: Self-pay

## 2018-06-25 ENCOUNTER — Emergency Department (HOSPITAL_COMMUNITY): Payer: Medicare Other

## 2018-06-25 ENCOUNTER — Other Ambulatory Visit: Payer: Self-pay

## 2018-06-25 ENCOUNTER — Emergency Department (HOSPITAL_COMMUNITY)
Admission: EM | Admit: 2018-06-25 | Discharge: 2018-06-25 | Disposition: A | Discharge status: Home / Self Care | Payer: Medicare Other | Attending: Emergency Medicine | Admitting: Emergency Medicine

## 2018-06-25 DIAGNOSIS — N189 Chronic kidney disease, unspecified: Secondary | ICD-10-CM | POA: Diagnosis not present

## 2018-06-25 DIAGNOSIS — W19XXXA Unspecified fall, initial encounter: Secondary | ICD-10-CM | POA: Insufficient documentation

## 2018-06-25 DIAGNOSIS — M25521 Pain in right elbow: Secondary | ICD-10-CM | POA: Insufficient documentation

## 2018-06-25 DIAGNOSIS — Z79899 Other long term (current) drug therapy: Secondary | ICD-10-CM | POA: Insufficient documentation

## 2018-06-25 DIAGNOSIS — I129 Hypertensive chronic kidney disease with stage 1 through stage 4 chronic kidney disease, or unspecified chronic kidney disease: Secondary | ICD-10-CM | POA: Diagnosis not present

## 2018-06-25 DIAGNOSIS — M109 Gout, unspecified: Secondary | ICD-10-CM

## 2018-06-25 DIAGNOSIS — Z7982 Long term (current) use of aspirin: Secondary | ICD-10-CM | POA: Insufficient documentation

## 2018-06-25 DIAGNOSIS — Z87891 Personal history of nicotine dependence: Secondary | ICD-10-CM | POA: Insufficient documentation

## 2018-06-25 DIAGNOSIS — I2510 Atherosclerotic heart disease of native coronary artery without angina pectoris: Secondary | ICD-10-CM | POA: Diagnosis not present

## 2018-06-25 DIAGNOSIS — M25511 Pain in right shoulder: Secondary | ICD-10-CM | POA: Diagnosis not present

## 2018-06-25 LAB — BASIC METABOLIC PANEL
ANION GAP: 8 (ref 5–15)
BUN: 24 mg/dL — AB (ref 8–23)
CALCIUM: 9.3 mg/dL (ref 8.9–10.3)
CO2: 26 mmol/L (ref 22–32)
Chloride: 105 mmol/L (ref 98–111)
Creatinine, Ser: 1.22 mg/dL (ref 0.61–1.24)
GFR calc Af Amer: >60 mL/min (ref >60)
GFR calc non Af Amer: 56 mL/min — ABNORMAL LOW (ref >60)
Glucose, Bld: 115 mg/dL — ABNORMAL HIGH (ref 70–99)
Potassium: 4.1 mmol/L (ref 3.5–5.1)
Sodium: 139 mmol/L (ref 135–145)

## 2018-06-25 LAB — CBC WITH DIFFERENTIAL/PLATELET
Abs Immature Granulocytes: 0.02 10*3/uL (ref 0.00–0.07)
BASOS PCT: 0 %
Basophils Absolute: 0 10*3/uL (ref 0.0–0.1)
EOS ABS: 0 10*3/uL (ref 0.0–0.5)
EOS PCT: 1 %
HEMATOCRIT: 38.3 % — AB (ref 39.0–52.0)
Hemoglobin: 12 g/dL — ABNORMAL LOW (ref 13.0–17.0)
IMMATURE GRANULOCYTES: 0 %
LYMPHS ABS: 0.7 10*3/uL (ref 0.7–4.0)
Lymphocytes Relative: 10 %
MCH: 29.1 pg (ref 26.0–34.0)
MCHC: 31.3 g/dL (ref 30.0–36.0)
MCV: 93 fL (ref 80.0–100.0)
MONO ABS: 1.1 10*3/uL — AB (ref 0.1–1.0)
MONOS PCT: 16 %
NEUTROS PCT: 73 %
Neutro Abs: 5 10*3/uL (ref 1.7–7.7)
Platelets: 194 10*3/uL (ref 150–400)
RBC: 4.12 MIL/uL — ABNORMAL LOW (ref 4.22–5.81)
RDW: 13.6 % (ref 11.5–15.5)
WBC: 6.8 10*3/uL (ref 4.0–10.5)
nRBC: 0 % (ref 0.0–0.2)

## 2018-06-25 LAB — SEDIMENTATION RATE: Sed Rate: 25 mm/hr — ABNORMAL HIGH (ref 0–16)

## 2018-06-25 LAB — SYNOVIAL CELL COUNT + DIFF, W/ CRYSTALS
EOSINOPHILS-SYNOVIAL: 0 % (ref 0–1)
LYMPHOCYTES-SYNOVIAL FLD: 1 % (ref 0–20)
MONOCYTE-MACROPHAGE-SYNOVIAL FLUID: 10 % — AB (ref 50–90)
Neutrophil, Synovial: 89 % — ABNORMAL HIGH (ref 0–25)
WBC, Synovial: 61500 /mm3 — ABNORMAL HIGH (ref 0–200)

## 2018-06-25 LAB — C-REACTIVE PROTEIN: CRP: 4.5 mg/dL — ABNORMAL HIGH (ref <1.0)

## 2018-06-25 MED ORDER — LIDOCAINE-EPINEPHRINE 2 %-1:100000 IJ SOLN
20.0000 mL | Freq: Once | INTRAMUSCULAR | Status: AC
  Administered 2018-06-25: 20 mL
  Filled 2018-06-25: qty 20

## 2018-06-25 MED ORDER — PREDNISONE 20 MG PO TABS
60.0000 mg | ORAL_TABLET | Freq: Once | ORAL | Status: AC
  Administered 2018-06-25: 60 mg via ORAL
  Filled 2018-06-25: qty 3

## 2018-06-25 MED ORDER — COLCHICINE 0.6 MG PO TABS
0.6000 mg | ORAL_TABLET | Freq: Once | ORAL | Status: AC
  Administered 2018-06-25: 0.6 mg via ORAL
  Filled 2018-06-25: qty 1

## 2018-06-25 MED ORDER — KETOROLAC TROMETHAMINE 30 MG/ML IJ SOLN
15.0000 mg | Freq: Once | INTRAMUSCULAR | Status: AC
  Administered 2018-06-25: 15 mg via INTRAVENOUS
  Filled 2018-06-25: qty 1

## 2018-06-25 MED ORDER — FENTANYL CITRATE (PF) 100 MCG/2ML IJ SOLN
100.0000 ug | Freq: Once | INTRAMUSCULAR | Status: AC
  Administered 2018-06-25: 100 ug via INTRAVENOUS
  Filled 2018-06-25: qty 2

## 2018-06-25 MED ORDER — LIDOCAINE-EPINEPHRINE (PF) 2 %-1:200000 IJ SOLN
INTRAMUSCULAR | Status: AC
  Filled 2018-06-25: qty 20

## 2018-06-25 MED ORDER — COLCHICINE 0.6 MG PO TABS
1.2000 mg | ORAL_TABLET | Freq: Once | ORAL | Status: AC
  Administered 2018-06-25: 1.2 mg via ORAL
  Filled 2018-06-25: qty 2

## 2018-06-25 MED ORDER — PREDNISONE 20 MG PO TABS: ORAL_TABLET | ORAL | 0 refills | Status: DC

## 2018-06-25 NOTE — ED Provider Notes (Signed)
[facility] DEPT Provider Note   CSN: [id number] Arrival date & time: 06/25/18  0847     History   Chief Complaint Chief Complaint  Patient presents with  . Shoulder Pain    HPI [name] Plitt. is a 78 y.o. male.  78 yo M with a chief complaint of right arm pain.  The patient fell a couple days ago.  States that he tripped and he landed on an outstretched arm.  Complaining of pain mostly to the elbow and the shoulder.  He has a history of rotator cuff injury that is not a good candidate for repair.  He is had limited use of that shoulder for some time.  He thinks maybe he worsened it by falling on it.  He denies other injury denies head injury denies loss of consciousness denies neck pain chest pain abdominal pain.  The history is provided by the patient.  Shoulder Pain  Associated symptoms: no fever   Injury  This is a new problem. The current episode started less than 1 hour ago. The problem occurs constantly. The problem has not changed since onset.Pertinent negatives include no chest pain, no abdominal pain, no headaches and no shortness of breath. Nothing aggravates the symptoms. Nothing relieves the symptoms. He has tried nothing for the symptoms. The treatment provided no relief.    Past Medical History:  Diagnosis Date  . Allergic rhinitis due to pollen   . Allergy   . Anxiety state, unspecified   . Arthritis    "right shoulder" (05/03/2012)  . Atrial flutter ([place])    a. dx after inguinal hernia repair in 10/12 => seen by Dr. Acie Fredrickson  . Calculus of kidney   . Cervicalgia   . Cervicalgia   . Clostridium difficile colitis   . Complications affecting other specified body systems, hypertension   . Coronary atherosclerosis of unspecified type of vessel, native or graft   . Degeneration of cervical intervertebral disc   . Depression   . Depressive disorder, not elsewhere classified   . GERD (gastroesophageal reflux disease)   . HTN  (hypertension)   . Hx of echocardiogram    a. Echo 11/12:  Mod LVH, EF 55-60%, MAC, mod LAE, mild RAE  . Impotence of organic origin   . Inguinal hernia without mention of obstruction or gangrene, unilateral or unspecified, (not specified as recurrent)   . Insomnia, unspecified   . Intestinal infection due to Clostridium difficile   . Irritable bowel syndrome   . Kidney stone    "they just passed" (05/03/2012)  . Lumbago   . Nonspecific abnormal electrocardiogram (ECG) (EKG)   . Nonspecific abnormal results of pulmonary system function study   . Obesity, unspecified   . Osteoarthrosis, unspecified whether generalized or localized, unspecified site   . Other malaise and fatigue   . Other specified cardiac dysrhythmias(427.89)   . Pain in joint, ankle and foot   . Pain in joint, lower leg   . Pain in joint, pelvic region and thigh   . Pain in joint, shoulder region   . Partial deafness   . Rash and other nonspecific skin eruption   . Sacroiliitis, not elsewhere classified ([place])   . Sacroiliitis, not elsewhere classified ([place])   . Spasm of muscle   . Special screening for malignant neoplasm of prostate   . Unspecified constipation   . Unspecified hereditary and idiopathic peripheral neuropathy     Patient Active Problem List   Diagnosis  Date Noted  . Seborrheic keratosis 08/17/2016  . Sprain of ankle 05/12/2016  . Rash and nonspecific skin eruption 01/06/2016  . Pain in both knees 11/25/2015  . Myalgia and myositis 11/25/2015  . Weak 11/25/2015  . Insomnia 09/03/2015  . Frequency of urination 05/27/2015  . Post herpetic Neuropathy 03/12/2015  . Depression, major, recurrent, moderate ([place]) 03/12/2015  . Stress at home 01/26/2015  . Pain in left foot 06/26/2014  . Corn of toe 01/09/2014  . Allergic rhinitis 10/01/2013  . Rotator cuff tear arthropathy of right shoulder 10/01/2013  . Pain in joint, shoulder region 01/02/2013  . HTN (hypertension)   . Partial deafness   .  Obesity   . Hammertoe 10/04/2012  . CKD (chronic kidney disease) 03/22/2012  . Chronic constipation 11/13/2010    Past Surgical History:  Procedure Laterality Date  . APPENDECTOMY  ~ 1956  . ATRIAL FLUTTER ABLATION N/A 05/03/2012   Procedure: ATRIAL FLUTTER ABLATION;  Surgeon: Evans Lance, MD;  Location: [facility] CATH LAB;  Service: Cardiovascular;  Laterality: N/A;  . CARDIAC CATHETERIZATION  08/1999; 05/03/2012   normal coronary arteries;   . CARPAL TUNNEL WITH CUBITAL TUNNEL  2004   "right" (05/03/2012)  . COLONOSCOPY  06/06/2008   severe sigmoid diverticulosis and internal hemorrhoids  . ESOPHAGOGASTRODUODENOSCOPY  06/06/2008   normal  . INGUINAL HERNIA REPAIR  03/23/11   "left" (05/03/2012)  . POSTERIOR LUMBAR FUSION  1989  . reconstruction (r) foot surgery     DR SUE   . RENAL CYST EXCISION     pt denies this hx on 05/03/2012  . [place]  . SPINE SURGERY  1969   Pantopaque Myelography and spinal infusion- Dr. Lyman Speller  . TONSILLECTOMY  ~ 1946  . TOTAL HIP ARTHROPLASTY  1999-2000   bilateral        Home Medications    Prior to Admission medications   Medication Sig Start Date End Date Taking? Authorizing Provider  Ascorbic Acid (VITAMIN C) 1000 MG tablet Take 1,000 mg by mouth daily.   Yes [provider]  BYSTOLIC 10 MG tablet Take 10 mg by mouth daily. 06/22/18  Yes [provider]  cyanocobalamin 2000 MCG tablet Take 2,000 mcg by mouth daily.   Yes [provider]  finasteride (PROSCAR) 5 MG tablet One daily to treat prostate Patient taking differently: Take 5 mg by mouth daily.  06/20/17  Yes Reed, Tiffany L, [name] [name]  losartan-hydrochlorothiazide (HYZAAR) 100-25 MG tablet TAKE 1 TABLET BY MOUTH EVERY DAY Patient taking differently: Take 1 tablet by mouth daily.  08/09/17  Yes Reed, Tiffany L, [name] [name]  Multiple Vitamins-Minerals (CENTRUM SILVER 50+MEN) TABS Take 1 tablet by mouth daily.   Yes [provider]  oxyCODONE (ROXICODONE) 5 MG  immediate release tablet Take one tablet by mouth twice daily as needed for severe pain unrelieved with tylenol or ibuprofen Patient taking differently: Take 2.5-5 mg by mouth 2 (two) times daily as needed for severe pain.  10/12/17  Yes Reed, Tiffany L, [name] [name]  triamcinolone cream (KENALOG) 0.1 % Apply 1 application topically 4 (four) times daily as needed (irritation).  06/20/18  Yes [provider]  amLODipine (NORVASC) 10 MG tablet Take 1 tablet (10 mg total) by mouth daily. Patient not taking: Reported on 06/25/2018 09/26/17   Lauree Chandler, NP  aspirin EC 81 MG tablet Take 1 tablet (81 mg total) by mouth daily. Patient not taking: Reported on 06/25/2018 07/04/12   Cristopher Peru  W, MD  meloxicam (MOBIC) 7.5 MG tablet Take 1 tablet (7.5 mg total) by mouth daily. Patient not taking: Reported on 06/25/2018 06/20/17   Mariea Clonts, Tiffany L, [name] [name]  ondansetron (ZOFRAN) 4 MG tablet TAKE 1 TABLET BY MOUTH EVERY 8 HOURS AS NEEDED FOR NAUSEA Patient not taking: Reported on 06/25/2018 10/26/16   Estill Dooms, MD  predniSONE (DELTASONE) 20 MG tablet 2 tabs po daily x 4 days 06/25/18   Deno Etienne, [name] [name]  Amlodipine Besylate-Valsartan (EXFORGE PO) Take by mouth daily.    08/08/11  [provider]    Family History Family History  Problem Relation Age of Onset  . Hypertension Father   . Colon cancer Neg Hx     Social History Social History   Tobacco Use  . Smoking status: Former Smoker    Packs/day: 2.00    Years: 5.00    Pack years: 10.00    Types: Cigarettes  . Smokeless tobacco: Never Used  . Tobacco comment: 05/03/2012 "smoked from age 72 to 89"  Substance Use Topics  . Alcohol use: Yes    Comment: "1 margaritia a week"  . Drug use: No     Allergies   Amitriptyline; Azithromycin; and Flexeril [cyclobenzaprine]   Review of Systems Review of Systems  Constitutional: Negative for chills and fever.  HENT: Negative for congestion and facial swelling.   Eyes: Negative for discharge  and visual disturbance.  Respiratory: Negative for shortness of breath.   Cardiovascular: Negative for chest pain and palpitations.  Gastrointestinal: Negative for abdominal pain, diarrhea and vomiting.  Musculoskeletal: Positive for arthralgias. Negative for myalgias.  Skin: Negative for color change and rash.  Neurological: Negative for tremors, syncope and headaches.  Psychiatric/Behavioral: Negative for confusion and dysphoric mood.     Physical Exam Updated Vital Signs BP 137/63   Pulse (!) 57   SpO2 100%   Physical Exam Vitals signs and nursing note reviewed.  Constitutional:      Appearance: He is well-developed.  HENT:     Head: Normocephalic and atraumatic.  Eyes:     Pupils: Pupils are equal, round, and reactive to light.  Neck:     Musculoskeletal: Normal range of motion and neck supple.     Vascular: No JVD.  Cardiovascular:     Rate and Rhythm: Normal rate and regular rhythm.     Heart sounds: No murmur. No friction rub. No gallop.   Pulmonary:     Effort: No respiratory distress.     Breath sounds: No wheezing.  Abdominal:     General: There is no distension.     Tenderness: There is no guarding or rebound.  Musculoskeletal: Normal range of motion.        General: Swelling and tenderness present.     Comments: Diffuse pain about the right arm worst to the R elbow.    Pulse motor and sensation is intact distally.  Skin:    Coloration: Skin is not pale.     Findings: No rash.  Neurological:     Mental Status: He is alert and oriented to person, place, and time.  Psychiatric:        Behavior: Behavior normal.      ED Treatments / Results  Labs (all labs ordered are listed, but only abnormal results are displayed) Labs Reviewed  CBC WITH DIFFERENTIAL/PLATELET - Abnormal; Notable for the following components:      Result Value   RBC 4.12 (*)    Hemoglobin 12.0 (*)  HCT 38.3 (*)    Monocytes Absolute 1.1 (*)    All other components within  normal limits  BASIC METABOLIC PANEL - Abnormal; Notable for the following components:   Glucose, Bld 115 (*)    BUN 24 (*)    GFR calc non Af Amer 56 (*)    All other components within normal limits  SEDIMENTATION RATE - Abnormal; Notable for the following components:   Sed Rate 25 (*)    All other components within normal limits  C-REACTIVE PROTEIN - Abnormal; Notable for the following components:   CRP 4.5 (*)    All other components within normal limits  SYNOVIAL CELL COUNT + DIFF, W/ CRYSTALS - Abnormal; Notable for the following components:   Color, Synovial RED (*)    Appearance-Synovial TURBID (*)    WBC, Synovial 61,500 (*)    Neutrophil, Synovial 89 (*)    Monocyte-Macrophage-Synovial Fluid 10 (*)    All other components within normal limits  BODY FLUID CULTURE  GRAM STAIN  GLUCOSE, BODY FLUID OTHER  PROTEIN, BODY FLUID (OTHER)    EKG None  Radiology Dg Elbow Complete Right  Result Date: 06/25/2018 CLINICAL DATA:  Pain. EXAM: RIGHT ELBOW - COMPLETE 3+ VIEW COMPARISON:  None. FINDINGS: Diffuse degenerative disease is seen involving the elbow joint without evidence of fracture, dislocation or joint effusion. No bony lesions. IMPRESSION: Advanced degenerative disease of the right elbow. Electronically Signed   By: Aletta Edouard M.D.   On: 06/25/2018 10:08   Ct Elbow Right Wo Contrast  Result Date: 06/25/2018 CLINICAL DATA:  Elbow trauma, possible fracture. EXAM: CT OF THE LOWER RIGHT EXTREMITY WITHOUT CONTRAST TECHNIQUE: Multidetector CT imaging of the right lower extremity was performed according to the standard protocol. COMPARISON:  Radiographs 06/25/2018 FINDINGS: The patient was unable to hold his elbow and a suitable anatomic position. This results in distorted multiplanar reconstructions and reduced diagnostic sensitivity and specificity. Also, the patient's elbow had to be held down at his side rather than overhead, introducing additional streak artifact  and thus reducing the signal to noise ratio. Bones/Joint/Cartilage Markedly severe degenerative arthropathy. Extensive spurring of the radial head, olecranon, coronoid process, and the humeral condyles and even the supracondylar region of the humerus. There are fragmented spurs along the olecranon and probably along the medial portion of the coronoid process. Some of these may be intra-articular. No well-defined acute fracture is identified, however negative predictive value is significantly adversely affected for subtle fractures due to the severity of spurring and degenerative arthropathy. Ligaments Suboptimally assessed by CT. Muscles and Tendons No obvious abnormality. Soft tissues No supplemental non-categorized findings. IMPRESSION: Severe osteoarthritis of the elbow with loss of articular space and prominent spurring. There are some fragmented spurs observed he, which are probably chronic but technically nonspecific in terms of chronicity. No obvious acute fracture is identified. Reduced sensitivity due to the severity of osteoarthritis. If pain persists, MRI may be warranted for complimentary assessment. Electronically Signed   By: Van Clines M.D.   On: 06/25/2018 12:12   Dg Humerus Right  Result Date: 06/25/2018 CLINICAL DATA:  Pain. EXAM: RIGHT HUMERUS - 2+ VIEW COMPARISON:  None. FINDINGS: No fracture or bony lesion identified. Proliferative disease of the [facility] joint present. IMPRESSION: No acute findings.  AC joint degenerative disease. Electronically Signed   By: Aletta Edouard M.D.   On: 06/25/2018 10:08    Procedures .Joint Aspiration/Arthrocentesis Date/Time: 06/25/2018 1:43 PM Performed by: Deno Etienne, [name] [name] Authorized by: Deno Etienne, [name] [name]  Consent:    Consent obtained:  Verbal   Consent given by:  Patient   Risks discussed:  Bleeding, infection, incomplete drainage, nerve damage and pain   Alternatives discussed:  No treatment, delayed treatment, alternative treatment,  observation and referral Location:    Location:  Elbow   Elbow:  R elbow Anesthesia (see MAR for exact dosages):    Anesthesia method:  Local infiltration   Local anesthetic:  Lidocaine 2% WITH epi Procedure details:    Preparation: Patient was prepped and draped in usual sterile fashion     Needle gauge: 21.   Ultrasound guidance: no     Approach:  Lateral   Aspirate amount:  10   Aspirate characteristics:  Blood-tinged   Steroid injected: no     Specimen collected: yes   Post-procedure details:    Dressing:  Adhesive bandage   Patient tolerance of procedure:  Tolerated well, no immediate complications   (including critical care time)  Medications Ordered in ED Medications  lidocaine-EPINEPHrine (XYLOCAINE W/EPI) 2 %-1:200000 (PF) injection (has no administration in time range)  colchicine tablet 1.2 mg (has no administration in time range)  predniSONE (DELTASONE) tablet 60 mg (has no administration in time range)  colchicine tablet 0.6 mg (has no administration in time range)  fentaNYL (SUBLIMAZE) injection 100 mcg (100 mcg Intravenous Given 06/25/18 0926)  ketorolac (TORADOL) 30 MG/ML injection 15 mg (15 mg Intravenous Given 06/25/18 0926)  fentaNYL (SUBLIMAZE) injection 100 mcg (100 mcg Intravenous Given 06/25/18 1158)  lidocaine-EPINEPHrine (XYLOCAINE W/EPI) 2 %-1:100000 (with pres) injection 20 mL (20 mLs Infiltration Given by Other 06/25/18 1320)     Initial Impression / Assessment and Plan / ED Course  I have reviewed the triage vital signs and the nursing notes.  Pertinent labs & imaging results that were available during my care of the patient were reviewed by me and considered in my medical decision making (see chart for details).     78 yo M with a chief complaint of right arm pain.  Difficult exam with diffuse tenderness about the right arm though seems to be worse about the elbow.  Plain films viewed by me without obvious fracture.  Patient still in severe pain.   States that he normally is able to fully flex his arm.  He does have a chronic injury to that elbow where he is unable to fully extend it.  CT scan was performed without occult fracture.  I discussed the results with the patient.  As the patient is still in severe pain and unable to fully flex that arm or even flex it past about 90 degrees I was concerned about the possibility of septic arthritis.  Discussed risks and benefits of evaluation with the patient and he would like it performed.  Performed at bedside with bloody appearing joint fluid.  Suspect that the patient had some intra-articular injury instead of infection.  Given a very small amount of lidocaine.  Will await fluid studies.  Patient does have monosodium urate crystals and today white blood cell count of 61,500.  This is a neutrophilic predominance.  I discussed the case with Dr. Erlinda Hong, orthopedics.  He felt this was unlikely to be septic arthritis based on the fluid studies recommended treating his gout.  We will have him follow-up in the office.  3:15 PM:  I have discussed the diagnosis/risks/treatment options with the patient and believe the pt to be eligible for discharge home to follow-up with PCP, ortho. We also discussed returning  to the ED immediately if new or worsening sx occur. We discussed the sx which are most concerning (e.g., sudden worsening pain, fever, inability to tolerate by mouth) that necessitate immediate return. Medications administered to the patient during their visit and any new prescriptions provided to the patient are listed below.  Medications given during this visit Medications  lidocaine-EPINEPHrine (XYLOCAINE W/EPI) 2 %-1:200000 (PF) injection (has no administration in time range)  colchicine tablet 1.2 mg (has no administration in time range)  predniSONE (DELTASONE) tablet 60 mg (has no administration in time range)  colchicine tablet 0.6 mg (has no administration in time range)  fentaNYL (SUBLIMAZE)  injection 100 mcg (100 mcg Intravenous Given 06/25/18 0926)  ketorolac (TORADOL) 30 MG/ML injection 15 mg (15 mg Intravenous Given 06/25/18 0926)  fentaNYL (SUBLIMAZE) injection 100 mcg (100 mcg Intravenous Given 06/25/18 1158)  lidocaine-EPINEPHrine (XYLOCAINE W/EPI) 2 %-1:100000 (with pres) injection 20 mL (20 mLs Infiltration Given by Other 06/25/18 1320)     The patient appears reasonably screen and/or stabilized for discharge and I doubt any other medical condition or other [facility] requiring further screening, evaluation, or treatment in the ED at this time prior to discharge.    Final Clinical Impressions(s) / ED Diagnoses   Final diagnoses:  Right elbow pain  Acute gout of right elbow, unspecified cause    ED Discharge Orders         Ordered    predniSONE (DELTASONE) 20 MG tablet     06/25/18 Crystal, [name] Aymond, [name] [name] 06/25/18 1515

## 2018-06-25 NOTE — ED Notes (Signed)
Patient requesting to speak with supervisor.

## 2018-06-25 NOTE — ED Triage Notes (Signed)
He c/o chronic right shoulder pain, about which he tells [place] he has been told by his orthopedist that he has "rotator cuff tear that can't be fixed". He also tells [place] that he is on chronic Oxy I.R. and that he takes anywhere from 10-30 mg of Oxy for his shoulder pain.

## 2018-06-26 ENCOUNTER — Ambulatory Visit: Payer: Self-pay

## 2018-06-26 LAB — PROTEIN, BODY FLUID (OTHER): Total Protein, Body Fluid Other: 3 g/dL

## 2018-06-26 LAB — GLUCOSE, BODY FLUID OTHER: Glucose, Body Fluid Other: 24 mg/dL

## 2018-06-29 LAB — BODY FLUID CULTURE
CULTURE: NO GROWTH
SPECIAL REQUESTS: NORMAL

## 2018-07-21 ENCOUNTER — Ambulatory Visit (INDEPENDENT_AMBULATORY_CARE_PROVIDER_SITE_OTHER): Payer: Medicare Other | Admitting: Orthopedic Surgery

## 2018-08-30 ENCOUNTER — Ambulatory Visit (INDEPENDENT_AMBULATORY_CARE_PROVIDER_SITE_OTHER): Payer: Medicare Other | Admitting: Orthopedic Surgery

## 2018-09-15 ENCOUNTER — Other Ambulatory Visit: Payer: Self-pay | Admitting: Internal Medicine

## 2018-09-15 DIAGNOSIS — R350 Frequency of micturition: Secondary | ICD-10-CM

## 2018-09-20 ENCOUNTER — Other Ambulatory Visit: Payer: Self-pay

## 2018-09-20 ENCOUNTER — Encounter: Payer: Self-pay | Admitting: Podiatry

## 2018-09-20 ENCOUNTER — Ambulatory Visit: Payer: Medicare Other | Admitting: Podiatry

## 2018-09-20 VITALS — Temp 97.0°F

## 2018-09-20 DIAGNOSIS — M2041 Other hammer toe(s) (acquired), right foot: Secondary | ICD-10-CM | POA: Diagnosis not present

## 2018-09-20 DIAGNOSIS — Q828 Other specified congenital malformations of skin: Secondary | ICD-10-CM | POA: Diagnosis not present

## 2018-09-20 NOTE — Progress Notes (Signed)
Subjective:   Patient ID: [name] [name]., male   DOB: 78 y.o.   MRN: [id number]   HPI Patient presents stating this third toe is been really bothering me and this corn only got better for a short period of time and it is been very sore and I need something more done with it for the long-term   ROS      Objective:  Physical Exam  Neurovascular status intact with patient's third digit found to have a distal keratotic lesion that is painful when pressed make shoe gear difficult and patient is tried different padding and debridement techniques without current relief     Assessment:  Hammertoe deformity third right with keratotic lesion that is painful when pressed with a what appears to be relatively flexible deformity     Plan:  H&P discussed condition and I did discuss a flexor tenotomy to see if we can get the toe to lift and not have to have him having so much pain.  He wants to do this and he is scheduled for surgery and at this point is scheduled for outpatient procedure with education given to him and it will be done in the office.  Today using sterile sharp debridement I did debride the distal keratotic lesion and applied padding to take pressure off the toe and scheduled him for this outpatient procedure

## 2018-09-21 ENCOUNTER — Encounter (INDEPENDENT_AMBULATORY_CARE_PROVIDER_SITE_OTHER): Payer: Self-pay | Admitting: Orthopedic Surgery

## 2018-09-22 ENCOUNTER — Encounter: Payer: Self-pay | Admitting: Podiatry

## 2018-09-22 ENCOUNTER — Ambulatory Visit: Payer: Medicare Other | Admitting: Podiatry

## 2018-09-22 ENCOUNTER — Other Ambulatory Visit: Payer: Self-pay

## 2018-09-22 VITALS — Temp 98.1°F

## 2018-09-22 DIAGNOSIS — M2041 Other hammer toe(s) (acquired), right foot: Secondary | ICD-10-CM

## 2018-09-22 NOTE — Progress Notes (Signed)
Subjective:   Patient ID: [name] Camp., male   DOB: 78 y.o.   MRN: [id number]   HPI Patient presents for digital procedure to try to elevate the third toe right where there is chronic distal keratotic lesion   ROS      Objective:  Physical Exam  Neurovascular status unchanged with relatively flexible third digit right with distal keratotic lesion that is chronically sore     Assessment:  Hammertoe deformity third right with distal keratotic lesion     Plan:  I anesthetized the right third digit 60 mg like Marcaine mixture sterile prep applied to the right third toe and utilizing a sharp 18-gauge needle I did a puncture hole at the proximal to phalangeal joint slightly proximal to this and then utilizing a back-and-forth motion I was able to release the flexor tendon and the capsule at the inner phalangeal joint straighten the toe and placed sterile dressing on the toe after completion of procedure.  Advised on elevation and keeping it dry for 3 days and reappoint for [place] to review return back 2 weeks or earlier if any issues were to occur

## 2018-09-22 NOTE — Telephone Encounter (Signed)
It may.  Also cramp 911 as a topical is pretty effective.  He have to order that online.

## 2018-10-02 ENCOUNTER — Other Ambulatory Visit: Payer: Self-pay | Admitting: Internal Medicine

## 2018-10-02 DIAGNOSIS — R350 Frequency of micturition: Secondary | ICD-10-CM

## 2018-10-06 ENCOUNTER — Other Ambulatory Visit: Payer: Self-pay

## 2018-10-06 ENCOUNTER — Ambulatory Visit: Payer: Medicare Other | Admitting: Podiatry

## 2018-10-06 ENCOUNTER — Encounter: Payer: Self-pay | Admitting: Podiatry

## 2018-10-06 VITALS — Temp 97.5°F

## 2018-10-06 DIAGNOSIS — M2041 Other hammer toe(s) (acquired), right foot: Secondary | ICD-10-CM

## 2018-10-06 NOTE — Progress Notes (Signed)
Subjective:   Patient ID: [name] Camp., male   DOB: 77 y.o.   MRN: [id number]   HPI Patient states my toe seems to feel some better and I know I might end up needing bone surgery   ROS      Objective:  Physical Exam  Neurovascular status intact with patient's third digit showing increase flexibility and it appears there is diminished pressure against the distal portion of the toe     Assessment:  Seems to be improved after flexor tenotomy right     Plan:  Reviewed possibility for digital fusion but hopefully the procedure done will never require him to have bone surgery.  Reappoint when symptoms indicate signed visit

## 2018-12-04 DIAGNOSIS — M5136 Other intervertebral disc degeneration, lumbar region: Secondary | ICD-10-CM | POA: Insufficient documentation

## 2018-12-04 DIAGNOSIS — M7062 Trochanteric bursitis, left hip: Secondary | ICD-10-CM | POA: Insufficient documentation

## 2019-01-01 ENCOUNTER — Other Ambulatory Visit: Payer: Self-pay | Admitting: Internal Medicine

## 2019-01-01 DIAGNOSIS — N183 Chronic kidney disease, stage 3 unspecified: Secondary | ICD-10-CM

## 2019-01-10 ENCOUNTER — Other Ambulatory Visit: Payer: Medicare Other

## 2019-01-10 ENCOUNTER — Ambulatory Visit
Admission: RE | Admit: 2019-01-10 | Discharge: 2019-01-10 | Disposition: A | Discharge status: Home / Self Care | Payer: Medicare Other | Source: Ambulatory Visit | Attending: Internal Medicine | Admitting: Internal Medicine

## 2019-01-10 DIAGNOSIS — N183 Chronic kidney disease, stage 3 unspecified: Secondary | ICD-10-CM

## 2019-02-12 ENCOUNTER — Telehealth: Payer: Self-pay | Admitting: Cardiology

## 2019-02-12 NOTE — Telephone Encounter (Signed)
[place] from [facility] Dr. Silvestre Mesi office will be faxing over the last chart note on the patient

## 2019-02-20 NOTE — Progress Notes (Signed)
Cardiology Office Note:    Date:  02/22/2019   ID:  Dorna Mai Roxas Clymer., DOB 1940/07/27, MRN [id number]  PCP:  Crist Infante, MD  Cardiologist:  No primary care provider on file.  Electrophysiologist:  None   Referring MD: Crist Infante, MD   Chief Complaint  Patient presents with  . Bradycardia    History of Present Illness:    [name] Gutman. is a 78 y.o. male with a hx of atrial flutter s/p ablation 05/04/12, CAD HTN who is referred by Dr Joylene Draft for an evaluation of bradycardia.  Reports pulses as low as 45.  Asymptomatic with low HR.  Denies palpitations, dyspnea, lightheadedness, syncope, or chest pains.  Denies any bleeding issues.  Checks HR at home, has been in high 40s.  BP has been 140-150s/60.   Past Medical History:  Diagnosis Date  . Allergic rhinitis due to pollen   . Allergy   . Anxiety state, unspecified   . Arthritis    "right shoulder" (05/03/2012)  . Atrial flutter ([place])    a. dx after inguinal hernia repair in 10/12 => seen by Dr. Acie Fredrickson  . Calculus of kidney   . Cervicalgia   . Cervicalgia   . Clostridium difficile colitis   . Complications affecting other specified body systems, hypertension   . Coronary atherosclerosis of unspecified type of vessel, native or graft   . Degeneration of cervical intervertebral disc   . Depression   . Depressive disorder, not elsewhere classified   . GERD (gastroesophageal reflux disease)   . HTN (hypertension)   . Hx of echocardiogram    a. Echo 11/12:  Mod LVH, EF 55-60%, MAC, mod LAE, mild RAE  . Impotence of organic origin   . Inguinal hernia without mention of obstruction or gangrene, unilateral or unspecified, (not specified as recurrent)   . Insomnia, unspecified   . Intestinal infection due to Clostridium difficile   . Irritable bowel syndrome   . Kidney stone    "they just passed" (05/03/2012)  . Lumbago   . Nonspecific abnormal electrocardiogram (ECG) (EKG)   . Nonspecific abnormal results of pulmonary  system function study   . Obesity, unspecified   . Osteoarthrosis, unspecified whether generalized or localized, unspecified site   . Other malaise and fatigue   . Other specified cardiac dysrhythmias(427.89)   . Pain in joint, ankle and foot   . Pain in joint, lower leg   . Pain in joint, pelvic region and thigh   . Pain in joint, shoulder region   . Partial deafness   . Rash and other nonspecific skin eruption   . Sacroiliitis, not elsewhere classified ([place])   . Sacroiliitis, not elsewhere classified ([place])   . Spasm of muscle   . Special screening for malignant neoplasm of prostate   . Unspecified constipation   . Unspecified hereditary and idiopathic peripheral neuropathy     Past Surgical History:  Procedure Laterality Date  . APPENDECTOMY  ~ 1956  . ATRIAL FLUTTER ABLATION N/A 05/03/2012   Procedure: ATRIAL FLUTTER ABLATION;  Surgeon: Evans Lance, MD;  Location: [facility] CATH LAB;  Service: Cardiovascular;  Laterality: N/A;  . CARDIAC CATHETERIZATION  08/1999; 05/03/2012   normal coronary arteries;   . CARPAL TUNNEL WITH CUBITAL TUNNEL  2004   "right" (05/03/2012)  . COLONOSCOPY  06/06/2008   severe sigmoid diverticulosis and internal hemorrhoids  . ESOPHAGOGASTRODUODENOSCOPY  06/06/2008   normal  . INGUINAL HERNIA REPAIR  03/23/11   "left" (  05/03/2012)  . POSTERIOR LUMBAR FUSION  1989  . reconstruction (r) foot surgery     DR SUE   . RENAL CYST EXCISION     pt denies this hx on 05/03/2012  . [place]  . SPINE SURGERY  1969   Pantopaque Myelography and spinal infusion- Dr. Lyman Speller  . TONSILLECTOMY  ~ 1946  . TOTAL HIP ARTHROPLASTY  1999-2000   bilateral    Current Medications: No outpatient medications have been marked as taking for the 02/22/19 encounter (Office Visit) with Donato Heinz, MD.     Allergies:   Amitriptyline, Azithromycin, and Flexeril [cyclobenzaprine]   Social History   Socioeconomic History  . Marital status: Married    Spouse  name: Not on file  . Number of children: Not on file  . Years of education: Not on file  . Highest education level: Not on file  Occupational History  . Not on file  Social Needs  . Financial resource strain: Not hard at all  . Food insecurity    Worry: Never true    Inability: Never true  . Transportation needs    Medical: No    Non-medical: No  Tobacco Use  . Smoking status: Former Smoker    Packs/day: 2.00    Years: 5.00    Pack years: 10.00    Types: Cigarettes  . Smokeless tobacco: Never Used  . Tobacco comment: 05/03/2012 "smoked from age 73 to 76"  Substance and Sexual Activity  . Alcohol use: Yes    Comment: "1 margaritia a week"  . Drug use: No  . Sexual activity: Yes    Partners: Female  Lifestyle  . Physical activity    Days per week: 0 days    Minutes per session: 0 min  . Stress: Very much  Relationships  . Social Herbalist on phone: Once a week    Gets together: More than three times a week    Attends religious service: 1 to 4 times per year    Active member of club or organization: No    Attends meetings of clubs or organizations: Never    Relationship status: Married  Other Topics Concern  . Not on file  Social History Narrative   ** Merged History Encounter **         Family History: The patient's family history includes Hypertension in his father. There is no history of Colon cancer.  ROS:   Please see the history of present illness.    All other systems reviewed and are negative.  EKGs/Labs/Other Studies Reviewed:    The following studies were reviewed today:   EKG:  EKG is ordered today.  The ekg ordered today demonstrates atrial fibrillation, HR 47 bpm  Recent Labs: 06/25/2018: BUN 24; Creatinine, Ser 1.22; Hemoglobin 12.0; Platelets 194; Potassium 4.1; Sodium 139  Recent Lipid Panel    Component Value Date/Time   CHOL 163 10/17/2017 0807   TRIG 97 10/17/2017 0807   HDL 45 10/17/2017 0807   CHOLHDL 3.6 10/17/2017 0807    VLDL 16 04/02/2016 1208   LDLCALC 99 10/17/2017 0807    Physical Exam:    VS:  BP (!) 150/74   Pulse (!) 55   Ht _0  (1.88 m)   Wt 275 lb (124.7 kg)   BMI 35.31 kg/m     Wt Readings from Last 3 Encounters:  02/22/19 275 lb (124.7 kg)  10/20/17 267 lb (121.1 kg)  09/26/17 267 lb (121.1 kg)     ZOX:WRUE nourished, well developed in no acute distress HEENT: Normal NECK: No JVD; No carotid bruits LYMPHATICS: No lymphadenopathy CARDIAC: irregular, no murmurs, rubs, gallops RESPIRATORY:  Clear to auscultation without rales, wheezing or rhonchi  ABDOMEN: Soft, non-tender, non-distended MUSCULOSKELETAL:  No edema; No deformity  SKIN: Warm and dry NEUROLOGIC:  Alert and oriented x 3 PSYCHIATRIC:  Normal affect   ASSESSMENT:    1. Atrial fibrillation, unspecified type ([place])   2. Essential hypertension   3. Bradycardia    PLAN:    In order of problems listed above:  Atrial fibrillation: h/o AFL s/p ablation in 2013 with Dr Lovena Le.  No recurrence of atrial flutter since that time, but now in Afib.  CHADS-VASc 3 (HTN, age x2) - HR in 50s, will stop bystolic - TTE - Patient is agreeable to starting anticoagulation, will check BMET and start Eliquis 5 mg BID.  However, he is admanant that he does not want to be on anticoagulation long-term.  Will refer to Dr Lovena Le to discuss rhythm control options  HTN: on bystolic 5 mg daily and amlodipine 10 mg daily.   Losartan-HCTZ 100-25 mg is on med list, he is not sure he is taking this - Asked to call and confirm medications - Stop bystolic due to bradycardia as above.  Asked to check BP daily for next 1-2 weeks and call with results.  Will likely need to an additional antihypertensive since stopping bystolic   RTC in 1 month   Medication Adjustments/Labs and Tests Ordered: Current medicines are reviewed at length with the patient today.  Concerns regarding medicines are outlined above.  Orders Placed This Encounter  Procedures   . Basic metabolic panel  . EKG 12-Lead  . ECHOCARDIOGRAM COMPLETE   Meds ordered this encounter  Medications  . apixaban (ELIQUIS) 5 MG TABS tablet    Sig: Take 1 tablet (5 mg total) by mouth 2 (two) times daily.    Dispense:  60 tablet    Refill:  6    Patient Instructions  Medication Instructions:  Stop Bystolic Start Eliquis 5 mg take one tablet twice a day with breakfast and dinner   Lab work: Art gallery manager today   Testing/Procedures: Schedule Echo  Follow-Up: At [facility], you and your health needs are our priority.  As part of our continuing mission to provide you with exceptional heart care, we have created designated Provider Care Teams.  These Care Teams include your primary Cardiologist (physician) and Advanced Practice Providers (APPs -  Physician Assistants and Nurse Practitioners) who all work together to provide you with the care you need, when you need it. . Schedule appointment with Dr.Taylor to discuss afib ablation . Schedule appointment with Dr.Randeep Biondolillo in 1 month   Check Blood Pressure daily for 1 to 2 weeks Keep a diary Call office to report readings  Call back with a list of all medications    Signed, Donato Heinz, MD  02/22/2019 10:50 AM    [place]

## 2019-02-22 ENCOUNTER — Other Ambulatory Visit: Payer: Self-pay

## 2019-02-22 ENCOUNTER — Encounter: Payer: Self-pay | Admitting: Cardiology

## 2019-02-22 ENCOUNTER — Telehealth: Payer: Self-pay

## 2019-02-22 ENCOUNTER — Ambulatory Visit (INDEPENDENT_AMBULATORY_CARE_PROVIDER_SITE_OTHER): Payer: Medicare Other | Admitting: Cardiology

## 2019-02-22 VITALS — BP 150/74 | HR 55 | Ht 74.0 in | Wt 275.0 lb

## 2019-02-22 DIAGNOSIS — I4891 Unspecified atrial fibrillation: Secondary | ICD-10-CM | POA: Diagnosis not present

## 2019-02-22 DIAGNOSIS — R001 Bradycardia, unspecified: Secondary | ICD-10-CM | POA: Diagnosis not present

## 2019-02-22 DIAGNOSIS — I10 Essential (primary) hypertension: Secondary | ICD-10-CM

## 2019-02-22 LAB — BASIC METABOLIC PANEL
BUN/Creatinine Ratio: 16 (ref 10–24)
BUN: 25 mg/dL (ref 8–27)
CO2: 24 mmol/L (ref 20–29)
Calcium: 9.9 mg/dL (ref 8.6–10.2)
Chloride: 102 mmol/L (ref 96–106)
Creatinine, Ser: 1.56 mg/dL — ABNORMAL HIGH (ref 0.76–1.27)
GFR calc Af Amer: 48 mL/min/{1.73_m2} — ABNORMAL LOW (ref >59)
GFR calc non Af Amer: 42 mL/min/{1.73_m2} — ABNORMAL LOW (ref >59)
Glucose: 90 mg/dL (ref 65–99)
Potassium: 4.9 mmol/L (ref 3.5–5.2)
Sodium: 140 mmol/L (ref 134–144)

## 2019-02-22 MED ORDER — APIXABAN 5 MG PO TABS: 5.0000 mg | ORAL_TABLET | Freq: Two times a day (BID) | ORAL | 6 refills | Status: DC

## 2019-02-22 NOTE — Telephone Encounter (Signed)
Received call back from patient.Medications reviewed and he is taking all medications as listed.

## 2019-02-22 NOTE — Patient Instructions (Signed)
Medication Instructions:  Stop Bystolic Start Eliquis 5 mg take one tablet twice a day with breakfast and dinner   Lab work: Art gallery manager today   Testing/Procedures: Schedule Echo  Follow-Up: At [facility], you and your health needs are our priority.  As part of our continuing mission to provide you with exceptional heart care, we have created designated Provider Care Teams.  These Care Teams include your primary Cardiologist (physician) and Advanced Practice Providers (APPs -  Physician Assistants and Nurse Practitioners) who all work together to provide you with the care you need, when you need it. . Schedule appointment with Dr.Taylor to discuss afib ablation . Schedule appointment with Dr.Schumann in 1 month   Check Blood Pressure daily for 1 to 2 weeks Keep a diary Call office to report readings  Call back with a list of all medications

## 2019-02-23 ENCOUNTER — Telehealth: Payer: Self-pay

## 2019-02-23 NOTE — Telephone Encounter (Signed)
Spoke with pt regarding appt on 02/26/19. Pt stated he will check his vitals prior to his appt and did not have any questions at this time.

## 2019-02-26 ENCOUNTER — Encounter (HOSPITAL_COMMUNITY): Payer: Self-pay | Admitting: Family Medicine

## 2019-02-26 ENCOUNTER — Emergency Department (HOSPITAL_COMMUNITY): Payer: Medicare Other

## 2019-02-26 ENCOUNTER — Telehealth: Payer: Medicare Other | Admitting: Internal Medicine

## 2019-02-26 ENCOUNTER — Emergency Department (HOSPITAL_COMMUNITY)
Admission: EM | Admit: 2019-02-26 | Discharge: 2019-02-26 | Disposition: A | Discharge status: Home / Self Care | Payer: Medicare Other | Attending: Emergency Medicine | Admitting: Emergency Medicine

## 2019-02-26 DIAGNOSIS — I129 Hypertensive chronic kidney disease with stage 1 through stage 4 chronic kidney disease, or unspecified chronic kidney disease: Secondary | ICD-10-CM | POA: Insufficient documentation

## 2019-02-26 DIAGNOSIS — Z87891 Personal history of nicotine dependence: Secondary | ICD-10-CM | POA: Diagnosis not present

## 2019-02-26 DIAGNOSIS — Z79899 Other long term (current) drug therapy: Secondary | ICD-10-CM | POA: Diagnosis not present

## 2019-02-26 DIAGNOSIS — Z7901 Long term (current) use of anticoagulants: Secondary | ICD-10-CM | POA: Insufficient documentation

## 2019-02-26 DIAGNOSIS — M25512 Pain in left shoulder: Secondary | ICD-10-CM | POA: Insufficient documentation

## 2019-02-26 DIAGNOSIS — N189 Chronic kidney disease, unspecified: Secondary | ICD-10-CM | POA: Insufficient documentation

## 2019-02-26 NOTE — ED Notes (Signed)
Ortho at bedside.

## 2019-02-26 NOTE — ED Triage Notes (Addendum)
Patient is from home and transported via [facility]. Patient states he tripped while getting off his scale this morning and complaining of left shoulder pain. He denies any LOC or other injuries besides his left shoulder. While assessing patient, noticed occasional abrasions on anterior abd and chest. He states he fell on some boxes and suitcases. Patient has a lower HR and is being followed with [facility] for a possible A-fib ablasion. On 09/24, he was instructed to take Eliquis. He denies starting the medication.

## 2019-02-26 NOTE — Discharge Instructions (Addendum)
There is no fracture in the left shoulder or dislocation.  Suspect that you have a bone bruise.  Use sling for comfort.  Recommend ice, Tylenol, Motrin.  Follow-up with your primary care doctor.

## 2019-02-26 NOTE — ED Notes (Signed)
Assisted patient with urinal.

## 2019-02-26 NOTE — ED Provider Notes (Signed)
[name] Provider Note   CSN: [id number] Arrival date & time: 02/26/19  1015     History   Chief Complaint Chief Complaint  Patient presents with  . Fall    HPI [name]. is a 78 y.o. male.     The history is provided by the patient.  Shoulder Pain Location:  Shoulder Shoulder location:  L shoulder Injury: yes   Pain details:    Quality:  Aching   Radiates to:  Does not radiate   Severity:  Mild   Onset quality:  Sudden   Timing:  Constant   Progression:  Unchanged Relieved by:  Immobilization Worsened by:  Movement Associated symptoms: no back pain, no decreased range of motion, no fatigue, no fever, no muscle weakness, no neck pain, no numbness, no stiffness and no tingling     Past Medical History:  Diagnosis Date  . Allergic rhinitis due to pollen   . Allergy   . Anxiety state, unspecified   . Arthritis    "right shoulder" (05/03/2012)  . Atrial flutter ([place])    a. dx after inguinal hernia repair in 10/12 => seen by Dr. Acie Fredrickson  . Calculus of kidney   . Cervicalgia   . Cervicalgia   . Clostridium difficile colitis   . Complications affecting other specified body systems, hypertension   . Coronary atherosclerosis of unspecified type of vessel, native or graft   . Degeneration of cervical intervertebral disc   . Depression   . Depressive disorder, not elsewhere classified   . GERD (gastroesophageal reflux disease)   . HTN (hypertension)   . Hx of echocardiogram    a. Echo 11/12:  Mod LVH, EF 55-60%, MAC, mod LAE, mild RAE  . Impotence of organic origin   . Inguinal hernia without mention of obstruction or gangrene, unilateral or unspecified, (not specified as recurrent)   . Insomnia, unspecified   . Intestinal infection due to Clostridium difficile   . Irritable bowel syndrome   . Kidney stone    "they just passed" (05/03/2012)  . Lumbago   . Nonspecific abnormal electrocardiogram (ECG) (EKG)   .  Nonspecific abnormal results of pulmonary system function study   . Obesity, unspecified   . Osteoarthrosis, unspecified whether generalized or localized, unspecified site   . Other malaise and fatigue   . Other specified cardiac dysrhythmias(427.89)   . Pain in joint, ankle and foot   . Pain in joint, lower leg   . Pain in joint, pelvic region and thigh   . Pain in joint, shoulder region   . Partial deafness   . Rash and other nonspecific skin eruption   . Sacroiliitis, not elsewhere classified ([place])   . Sacroiliitis, not elsewhere classified ([place])   . Spasm of muscle   . Special screening for malignant neoplasm of prostate   . Unspecified constipation   . Unspecified hereditary and idiopathic peripheral neuropathy     Patient Active Problem List   Diagnosis Date Noted  . Seborrheic keratosis 08/17/2016  . Sprain of ankle 05/12/2016  . Rash and nonspecific skin eruption 01/06/2016  . Pain in both knees 11/25/2015  . Myalgia and myositis 11/25/2015  . Weak 11/25/2015  . Insomnia 09/03/2015  . Frequency of urination 05/27/2015  . Post herpetic Neuropathy 03/12/2015  . Depression, major, recurrent, moderate ([place]) 03/12/2015  . Stress at home 01/26/2015  . Pain in left foot 06/26/2014  . Corn of toe 01/09/2014  . Allergic  rhinitis 10/01/2013  . Rotator cuff tear arthropathy of right shoulder 10/01/2013  . Pain in joint, shoulder region 01/02/2013  . HTN (hypertension)   . Partial deafness   . Obesity   . Hammertoe 10/04/2012  . CKD (chronic kidney disease) 03/22/2012  . Chronic constipation 11/13/2010    Past Surgical History:  Procedure Laterality Date  . APPENDECTOMY  ~ 1956  . ATRIAL FLUTTER ABLATION N/A 05/03/2012   Procedure: ATRIAL FLUTTER ABLATION;  Surgeon: Evans Lance, MD;  Location: [facility] CATH LAB;  Service: Cardiovascular;  Laterality: N/A;  . CARDIAC CATHETERIZATION  08/1999; 05/03/2012   normal coronary arteries;   . CARPAL TUNNEL WITH CUBITAL TUNNEL  2004    "right" (05/03/2012)  . COLONOSCOPY  06/06/2008   severe sigmoid diverticulosis and internal hemorrhoids  . ESOPHAGOGASTRODUODENOSCOPY  06/06/2008   normal  . INGUINAL HERNIA REPAIR  03/23/11   "left" (05/03/2012)  . POSTERIOR LUMBAR FUSION  1989  . reconstruction (r) foot surgery     DR SUE   . RENAL CYST EXCISION     pt denies this hx on 05/03/2012  . [place]  . SPINE SURGERY  1969   Pantopaque Myelography and spinal infusion- Dr. Lyman Speller  . TONSILLECTOMY  ~ 1946  . TOTAL HIP ARTHROPLASTY  1999-2000   bilateral        Home Medications    Prior to Admission medications   Medication Sig Start Date End Date Taking? Authorizing Provider  amLODipine (NORVASC) 10 MG tablet Take 1 tablet (10 mg total) by mouth daily. 09/26/17   Lauree Chandler, NP  apixaban (ELIQUIS) 5 MG TABS tablet Take 1 tablet (5 mg total) by mouth 2 (two) times daily. 02/22/19   Donato Heinz, MD  Ascorbic Acid (VITAMIN C) 1000 MG tablet Take 1,000 mg by mouth daily.    [provider]  finasteride (PROSCAR) 5 MG tablet One daily to treat prostate Patient taking differently: Take 5 mg by mouth daily.  06/20/17   Reed, Tiffany L, DO  fluocinonide cream (LIDEX) 0.05 %  09/18/18   [provider]  losartan-hydrochlorothiazide (HYZAAR) 100-25 MG tablet TAKE 1 TABLET BY MOUTH EVERY DAY Patient taking differently: Take 1 tablet by mouth daily.  08/09/17   Reed, Tiffany L, DO  Multiple Vitamins-Minerals (CENTRUM SILVER 50+MEN) TABS Take 1 tablet by mouth daily.    [provider]  oxyCODONE (ROXICODONE) 5 MG immediate release tablet Take one tablet by mouth twice daily as needed for severe pain unrelieved with tylenol or ibuprofen Patient taking differently: Take 2.5-5 mg by mouth 2 (two) times daily as needed for severe pain.  10/12/17   Reed, Tiffany L, DO  triamcinolone cream (KENALOG) 0.1 % Apply 1 application topically 4 (four) times daily as needed (irritation).  06/20/18    [provider]  Amlodipine Besylate-Valsartan (EXFORGE PO) Take by mouth daily.    08/08/11  [provider]    Family History Family History  Problem Relation Age of Onset  . Hypertension Father   . Colon cancer Neg Hx     Social History Social History   Tobacco Use  . Smoking status: Former Smoker    Packs/day: 2.00    Years: 5.00    Pack years: 10.00    Types: Cigarettes  . Smokeless tobacco: Never Used  . Tobacco comment: 05/03/2012 "smoked from age 66 to 33"  Substance Use Topics  . Alcohol use: Yes    Comment: "1  margaritia a week"  . Drug use: No     Allergies   Amitriptyline, Azithromycin, and Flexeril [cyclobenzaprine]   Review of Systems Review of Systems  Constitutional: Negative for chills, fatigue and fever.  HENT: Negative for ear pain and sore throat.   Eyes: Negative for pain and visual disturbance.  Respiratory: Negative for cough and shortness of breath.   Cardiovascular: Negative for chest pain and palpitations.  Gastrointestinal: Negative for abdominal pain and vomiting.  Genitourinary: Negative for dysuria and hematuria.  Musculoskeletal: Positive for arthralgias. Negative for back pain, neck pain and stiffness.  Skin: Negative for color change and rash.  Neurological: Negative for seizures and syncope.  All other systems reviewed and are negative.    Physical Exam Updated Vital Signs BP (!) 160/61 (BP Location: Right Arm)   Pulse (!) 37   Temp 97.7 F (36.5 C) (Oral)   Resp 20   SpO2 95%   Physical Exam Vitals signs and nursing note reviewed.  Constitutional:      General: He is not in acute distress.    Appearance: He is well-developed. He is not ill-appearing.  HENT:     Head: Normocephalic and atraumatic.     Nose: Nose normal.     Mouth/Throat:     Mouth: Mucous membranes are moist.  Eyes:     Extraocular Movements: Extraocular movements intact.     Conjunctiva/sclera: Conjunctivae normal.     Pupils:  Pupils are equal, round, and reactive to light.  Neck:     Musculoskeletal: Normal range of motion and neck supple. No muscular tenderness.     Comments: No midline spinal pain Cardiovascular:     Rate and Rhythm: Normal rate and regular rhythm.     Pulses: Normal pulses.     Heart sounds: No murmur.  Pulmonary:     Effort: Pulmonary effort is normal. No respiratory distress.     Breath sounds: Normal breath sounds.  Abdominal:     Palpations: Abdomen is soft.     Tenderness: There is no abdominal tenderness.  Musculoskeletal: Normal range of motion.        General: Tenderness and signs of injury present. No deformity.     Comments: Tenderness to left shoulder around the [facility] joint area, appears to have good range of motion upon my movement of the left shoulder, no obvious deformity of left upper extremity, no midline spinal tenderness  Skin:    General: Skin is warm and dry.  Neurological:     General: No focal deficit present.     Mental Status: He is alert and oriented to person, place, and time.     Cranial Nerves: No cranial nerve deficit.     Sensory: No sensory deficit.     Motor: No weakness.     Coordination: Coordination normal.     Comments: 5+ out of 5 strength throughout bilateral upper extremities, normal sensation in the upper extremities      ED Treatments / Results  Labs (all labs ordered are listed, but only abnormal results are displayed) Labs Reviewed - No data to display  EKG EKG Interpretation  Date/Time:  Monday February 26 2019 10:30:10 EDT Ventricular Rate:  42 PR Interval:    QRS Duration: 95 QT Interval:  528 QTC Calculation: 426 R Axis:   17 Text Interpretation:  Atrial fibrillation Abnormal R-wave progression, early transition Borderline repolarization abnormality Confirmed by Lennice Sites [phone]) on 02/26/2019 10:35:41 AM   Radiology Dg Shoulder Left  Result Date: 02/26/2019 CLINICAL DATA:  Fall.  Pain. EXAM: LEFT SHOULDER - 2+ VIEW  COMPARISON:  None. FINDINGS: There is no acute displaced fracture or dislocation. Evaluation was somewhat limited by patient positioning and movement. There is mild-to-moderate osteoarthritis of the left glenohumeral joint. There are degenerative changes of the left AC joint. IMPRESSION: No acute osseous abnormality. Electronically Signed   By: Constance Holster M.D.   On: 02/26/2019 10:55    Procedures Procedures (including critical care time)  Medications Ordered in ED Medications - No data to display   Initial Impression / Assessment and Plan / ED Course  I have reviewed the triage vital signs and the nursing notes.  Pertinent labs & imaging results that were available during my care of the patient were reviewed by me and considered in my medical decision making (see chart for details).     Bernardino Dowell Quashaun Lazalde. is a 78 year old male with history of depression, anxiety, arthritis who presents the ED with left shoulder pain after a fall.  Patient with unremarkable vitals.  No fever.  Patient was standing on a scale and when he stepped off, lost balance and landed on his left shoulder.  Did not hit his head or lose consciousness.  Patient is not on blood thinners.  Patient does have a prescription for Eliquis due to recent atrial fibrillation but has not taken it and is seeking a second opinion.  Patient with tenderness in the left shoulder area but no obvious deformity, does not appear to be dislocated but will get an x-ray to rule out fracture/dislocation.  Patient has normal strength and sensation upper extremities.  Did not hit his head or lose consciousness, no signs of head trauma.  No midline spinal tenderness.  X-ray left shoulder unremarkable.  Likely with a AC joint sprain/contusion.  Given a sling for comfort.  Recommend follow-up with primary care doctor.  Discharged in the ED in good condition.  Recommend ice, Tylenol, Motrin.  This chart was dictated using voice recognition software.   Despite best efforts to proofread,  errors can occur which can change the documentation meaning.    Final Clinical Impressions(s) / ED Diagnoses   Final diagnoses:  Acute pain of left shoulder    ED Discharge Orders    None       Lennice Sites, DO 02/26/19 1106

## 2019-02-28 ENCOUNTER — Telehealth: Payer: Medicare Other | Admitting: Internal Medicine

## 2019-03-01 ENCOUNTER — Other Ambulatory Visit (HOSPITAL_COMMUNITY): Payer: Medicare Other

## 2019-03-08 ENCOUNTER — Telehealth: Payer: Self-pay

## 2019-03-08 NOTE — Telephone Encounter (Signed)
Spoke with pt regarding his appt on 03/09/19. Pt was advise to check his vitals prior to his appt. Pt concerns were address.

## 2019-03-09 ENCOUNTER — Encounter: Payer: Self-pay | Admitting: Internal Medicine

## 2019-03-09 ENCOUNTER — Telehealth: Payer: Self-pay

## 2019-03-09 ENCOUNTER — Telehealth (INDEPENDENT_AMBULATORY_CARE_PROVIDER_SITE_OTHER): Payer: Medicare Other | Admitting: Internal Medicine

## 2019-03-09 VITALS — BP 106/71 | HR 55 | Ht 74.0 in | Wt 275.0 lb

## 2019-03-09 DIAGNOSIS — I4819 Other persistent atrial fibrillation: Secondary | ICD-10-CM | POA: Diagnosis not present

## 2019-03-09 DIAGNOSIS — I10 Essential (primary) hypertension: Secondary | ICD-10-CM

## 2019-03-09 DIAGNOSIS — R001 Bradycardia, unspecified: Secondary | ICD-10-CM

## 2019-03-09 NOTE — Progress Notes (Signed)
Electrophysiology TeleHealth Note   Due to national recommendations of social distancing due to [place] 19, Audio/video telehealth visit is felt to be most appropriate for this patient at this time.  See MyChart message from today for patient consent regarding telehealth for [name]   Date:  03/25/2019   ID:  [name] Camp., DOB 03/25/1941, MRN [id number]  Location: home Provider location: [address] [place] Evaluation Performed: New patient consult  PCP:  Crist Infante, MD  Cardiologist:  No primary care provider on file.  Electrophysiologist:  None   Chief Complaint:  bradycardia  History of Present Illness:    [name] Overdorf. is a 78 y.o. male who presents via audio/video conferencing for a telehealth visit today.   The patient is referred for new consultation regarding afib and bradycardia by Dr Gardiner Rhyme.  The patient has a h/o atrial flutter and is s/p ablation for this by Dr Lovena Le in 2013.  He has recently been evaluated by Dr Gardiner Rhyme for bradycardia with heart rates frequently 40s.  He has some dizziness and fatigue.  He has also been found to have atrial fibrillation.  He is appropriately anticoagulated with eliquis.   Today, he denies symptoms of palpitations, chest pain, shortness of breath, orthopnea, PND, lower extremity edema, claudication, presyncope, syncope, bleeding, or neurologic sequela. The patient is tolerating medications without difficulties and is otherwise without complaint today.   he denies symptoms of cough, fevers, chills, or new SOB worrisome for COVID 19.   Past Medical History:  Diagnosis Date  . Atrial flutter [facility])    s/p CTI ablation by Dr Lovena Le 2013  . Calculus of kidney   . Coronary atherosclerosis of unspecified type of vessel, native or graft   . Degeneration of cervical intervertebral disc   . Depression   . DJD (degenerative joint disease)   . GERD (gastroesophageal reflux disease)   . HTN (hypertension)    . Impotence of organic origin   . Inguinal hernia without mention of obstruction or gangrene, unilateral or unspecified, (not specified as recurrent)   . Insomnia, unspecified   . Intestinal infection due to Clostridium difficile   . Irritable bowel syndrome   . Kidney stone    "they just passed" (05/03/2012)  . Obesity, unspecified   . Partial deafness   . Sacroiliitis, not elsewhere classified ([place])   . Unspecified hereditary and idiopathic peripheral neuropathy     Past Surgical History:  Procedure Laterality Date  . APPENDECTOMY  ~ 1956  . ATRIAL FLUTTER ABLATION N/A 05/03/2012   CTI ablation by Dr Lovena Le in 2013  . CARDIAC CATHETERIZATION  08/1999; 05/03/2012   normal coronary arteries;   . CARPAL TUNNEL WITH CUBITAL TUNNEL  2004   "right" (05/03/2012)  . COLONOSCOPY  06/06/2008   severe sigmoid diverticulosis and internal hemorrhoids  . ESOPHAGOGASTRODUODENOSCOPY  06/06/2008   normal  . INGUINAL HERNIA REPAIR  03/23/11   "left" (05/03/2012)  . POSTERIOR LUMBAR FUSION  1989  . reconstruction (r) foot surgery     DR SUE   . RENAL CYST EXCISION     pt denies this hx on 05/03/2012  . [place]  . SPINE SURGERY  1969   Pantopaque Myelography and spinal infusion- Dr. Lyman Speller  . TONSILLECTOMY  ~ 1946  . TOTAL HIP ARTHROPLASTY  1999-2000   bilateral    Current [name] Medications  Medication Sig Dispense Refill  . apixaban (ELIQUIS) 5 MG  TABS tablet Take 1 tablet (5 mg total) by mouth 2 (two) times daily. 60 tablet 6  . Ascorbic Acid (VITAMIN C) 1000 MG tablet Take 1,000 mg by mouth daily.    . finasteride (PROSCAR) 5 MG tablet Take 5 mg by mouth daily.    Marland Kitchen losartan-hydrochlorothiazide (HYZAAR) 100-25 MG tablet Take 1 tablet by mouth daily.     No current facility-administered medications for this visit.     Allergies:   Amitriptyline, Azithromycin, and Flexeril [cyclobenzaprine]   Social History:  The patient  reports that he has quit smoking. His smoking use  included cigarettes. He has a 10.00 pack-year smoking history. He has never used smokeless tobacco. He reports current alcohol use. He reports that he does not use drugs.   Family History:  The patient's family history includes Hypertension in his father.    ROS:  Please see the history of present illness.   All other systems are personally reviewed and negative.    Exam:    Vital Signs:  BP 106/71   Pulse (!) 55   Ht 6\' 2"  (1.88 m)   Wt 275 lb (124.7 kg)   BMI 35.31 kg/m   Well sounding, alert and conversant,    Labs/Other Tests and Data Reviewed:    Recent Labs: 06/25/2018: Hemoglobin 12.0; Platelets 194 02/22/2019: BUN 25; Creatinine, Ser 1.56; Potassium 4.9; Sodium 140   Wt Readings from Last 3 Encounters:  03/09/19 275 lb (124.7 kg)  02/22/19 275 lb (124.7 kg)  10/20/17 267 lb (121.1 kg)     Other studies personally reviewed: Additional studies/ records that were reviewed today include: Dr Newman Nickels prior records, prior ekgs  Review of the above records today demonstrates: as above  Echo is pending  ASSESSMENT & PLAN:    1.  Symptomatic bradycardia The patient has longstanding bradycardia.  Prior ekgs reveal sinus bradycardia as well as marked first degree AV block.  Now that he is in afib, he has slow ventricular rates.   He has a narrow qrs We could consider left bundle pacing or leadless pacing as options ([employer] AV).  He has a pending echo, which could be helpful in making this decision.  Given relative paucity of symptoms, he is not excited about proceeding.  He wishes to think about this further.  2. Persistent afib Minimal symptoms He is anticoagulated with eliquis Echo is pending Bradycardia limits our treatment options Ultimately, he may do best with rate control alone  3. HTN Stable No change required today  4. Obesity Lifestyle modification is encouraged  Follow-up:  He will let us know if he decides to proceed with ablation.  Awaiting echo.  He has scheduled follow-up with Dr Gardiner Rhyme.  He has seen Dr Lovena Le previously for EP services and may benefit from follow-up with him.  Patient Risk:  after full review of this patients clinical status, I feel that they are at moderate risk at this time.  Today, I have spent 20 minutes with the patient with telehealth technology discussing bradycardia and afib   Signed, Thompson Grayer MD, [place]   [address] [place] [address] [phone] (office) [phone] (fax)

## 2019-03-09 NOTE — Telephone Encounter (Signed)
-----   Message from Thompson Grayer, MD sent at 03/09/2019 12:48 PM EDT ----- Please schedule for medtronic pacemaker at next available time  Hold eliquis 24 hours prior  Please let marcia to know that I want Shawn to come to do left bundle pacing

## 2019-03-13 ENCOUNTER — Telehealth: Payer: Self-pay | Admitting: Internal Medicine

## 2019-03-13 NOTE — Telephone Encounter (Signed)
Work up completed for pacemaker implant on 03/20/2019

## 2019-03-13 NOTE — Telephone Encounter (Signed)
Follow Up:   Pt said him and Dr Lamount Cohen had discussed last week, having a Pacemaker Implant. Pt says he is ready to go on with this this procedure.;

## 2019-03-16 ENCOUNTER — Other Ambulatory Visit (HOSPITAL_COMMUNITY): Payer: Medicare Other

## 2019-03-16 ENCOUNTER — Other Ambulatory Visit: Payer: Medicare Other

## 2019-03-20 ENCOUNTER — Ambulatory Visit (HOSPITAL_COMMUNITY): Admit: 2019-03-20 | Payer: Medicare Other | Admitting: Internal Medicine

## 2019-03-20 ENCOUNTER — Encounter (HOSPITAL_COMMUNITY): Payer: Self-pay

## 2019-03-20 SURGERY — PACEMAKER IMPLANT

## 2019-03-21 ENCOUNTER — Telehealth: Payer: Self-pay | Admitting: Cardiology

## 2019-03-21 NOTE — Telephone Encounter (Signed)
New Message     Patient would like to change appt to virtual for 03/26/19.  Tried to call to find out if Dr. Newman Nickels doing virtual appointments but no answer.  Please call back to advise.

## 2019-03-21 NOTE — Telephone Encounter (Signed)
Called and let pt know that Dr. Gardiner Rhyme does not do virtual appts. Offered to reschedule. Pt denied and requested to cancel appt.

## 2019-03-22 NOTE — Telephone Encounter (Signed)
I am starting virtual appointments next month on some quarter days, I could see him virtually then

## 2019-03-23 ENCOUNTER — Telehealth: Payer: Self-pay | Admitting: Internal Medicine

## 2019-03-23 ENCOUNTER — Other Ambulatory Visit: Payer: Self-pay | Admitting: Orthopedic Surgery

## 2019-03-23 DIAGNOSIS — M25512 Pain in left shoulder: Secondary | ICD-10-CM

## 2019-03-23 NOTE — Telephone Encounter (Signed)
Outreach  Made to Pt.  Advised this nurse had discussed with Dr. Rayann Heman.  Dr. Rayann Heman wanted to consider Pt and procedure over the weekend.  Will have plan on Monday.  Advised Pt would call him with plan on Monday.  Pt indicates understanding.

## 2019-03-23 NOTE — Telephone Encounter (Signed)
Patient would like to setup the time for his pacemaker.  Patient sent a message via MyChart as well.

## 2019-03-25 ENCOUNTER — Encounter: Payer: Self-pay | Admitting: Internal Medicine

## 2019-03-26 ENCOUNTER — Ambulatory Visit: Payer: Medicare Other | Admitting: Cardiology

## 2019-03-26 NOTE — Telephone Encounter (Signed)
Call returned to Pt after discussing case with Dr. Rayann Heman.  Discussed pacemaker placement.  Advised if he decided to go ahead with pacemaker he would be unable to use left arm for 5 days and then limited weight bearing for 6 weeks.  Advised pacemaker placement would require overnight stay.  Pt had verbalized in the past that he was concerned about being away from his wife overnight.  Advised Pt has history of bradycardia.  Asked if Pt had syncope, dizziness or weakness.  Pt denies any episodes of syncope/dizziness.  Advised Dr. Rayann Heman would like to discuss possible pacemaker further with Pt.   Advised Pt that pacemaker placement was not urgent unless he was symptomatic.  Pt indicates understanding and willing to follow up with Dr. Rayann Heman for further discussion.  Pt is also scheduled for MRI follow up for possible left shoulder surgery d/t injury.  Advised Pt to keep MRI and shoulder work up and we will work around it.  Will continue to monitor.  F/u appt made for May 10, 2019 to rediscuss.

## 2019-03-26 NOTE — Telephone Encounter (Signed)
Called pt to schedule virtual appointment on 11/3 with Dr. Gardiner Rhyme. Left message to call back.

## 2019-03-30 NOTE — Telephone Encounter (Signed)
Attempted to contact pt x 2. Left message to call back 

## 2019-04-02 NOTE — Telephone Encounter (Signed)
Virtual appointment scheduled on 11/9.

## 2019-04-03 ENCOUNTER — Ambulatory Visit: Payer: Medicare Other

## 2019-04-04 IMAGING — XA Imaging study
2 series · 2 of 2 positions shown · non-contrast
Comparison: none

CLINICAL DATA: Low back pain extending to the hips bilaterally,
right greater than left.

[Series 1: ortho standard · 1 of 1 slices shown (1 of 2)]
[im 1/1]
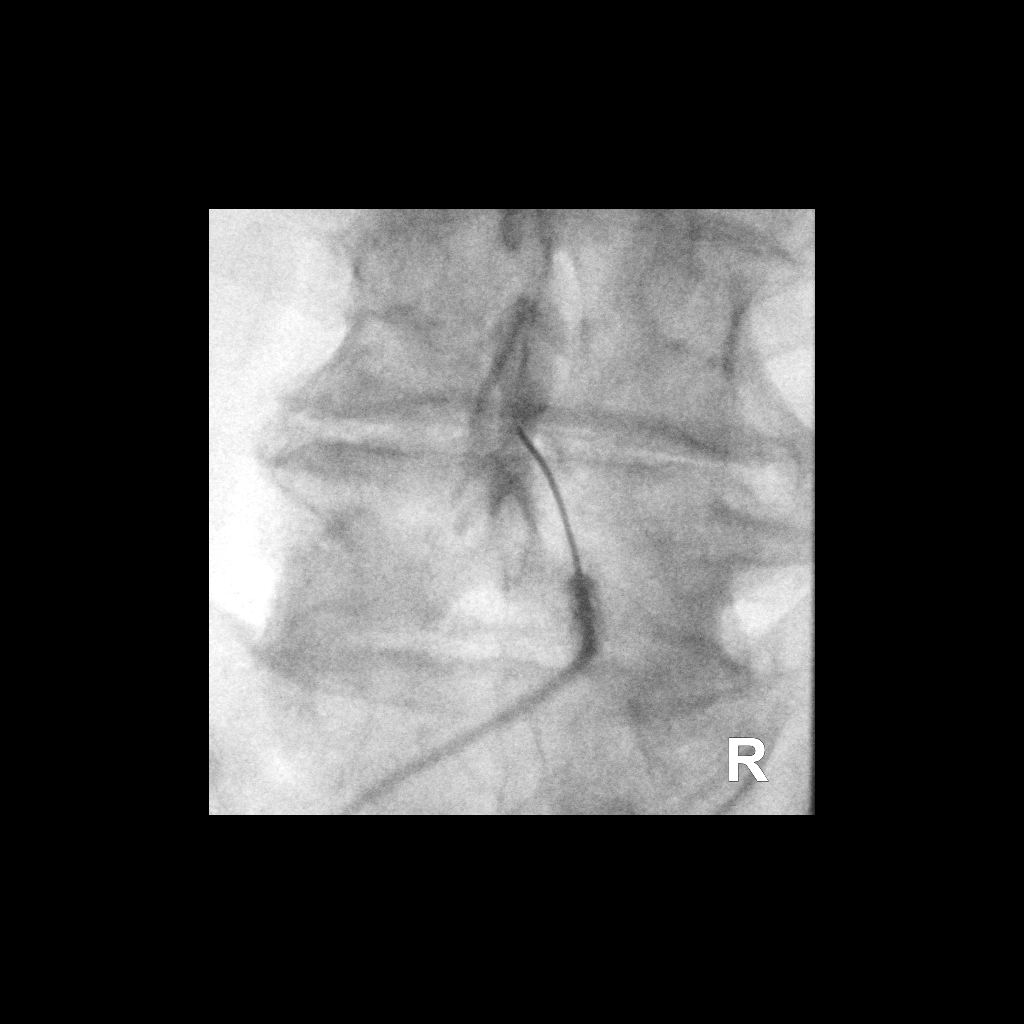

[Series 2: ortho standard · 1 of 1 slices shown (2 of 2)]
[im 1/1]
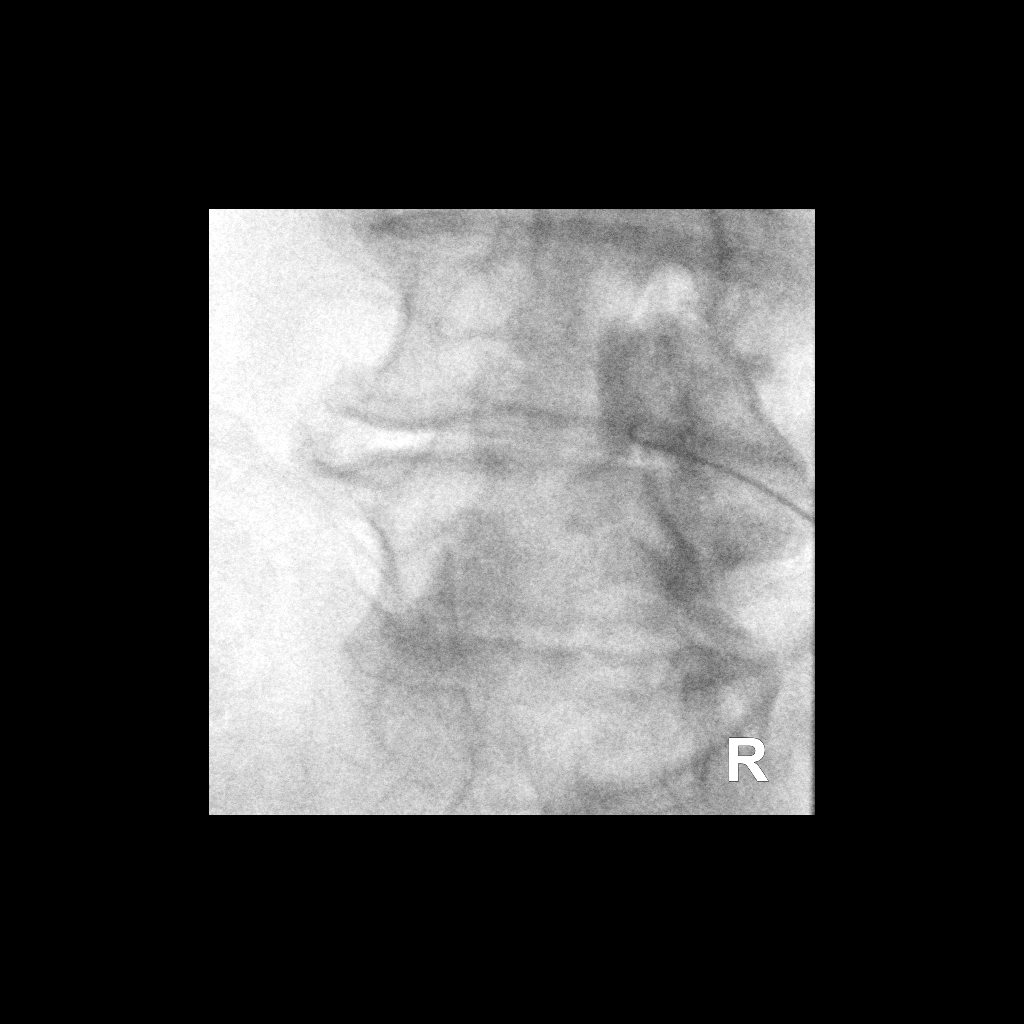

[2 of 2 positions shown; findings below may reference images not displayed]

FLUOROSCOPY TIME:  Radiation Exposure Index (as provided by the
fluoroscopic device): 22.90 uGy*m2

Fluoroscopy Time:  13 seconds

Number of Acquired Images:  0

PROCEDURE:
The procedure, risks, benefits, and alternatives were explained to
the patient. Questions regarding the procedure were encouraged and
answered. The patient understands and consents to the procedure.

LUMBAR EPIDURAL INJECTION:

An interlaminar approach was performed on right at L4-5. The
overlying skin was cleansed and anesthetized. A 20 gauge epidural
needle was advanced using loss-of-resistance technique.

DIAGNOSTIC EPIDURAL INJECTION:

Injection of Isovue-M 200 shows a good epidural pattern with spread
above and below the level of needle placement, primarily on the
right, although there is some contrast on both sides in the epidural
space. No vascular opacification is seen.

THERAPEUTIC EPIDURAL INJECTION:

120 mg of Depo-Medrol mixed with 1.5 mL 1% lidocaine were instilled.
The procedure was well-tolerated, and the patient was discharged
thirty minutes following the injection in good condition.

COMPLICATIONS:
None
IMPRESSION: Technically successful epidural injection on the right L4-5 # 1

## 2019-04-08 ENCOUNTER — Other Ambulatory Visit: Payer: Medicare Other

## 2019-04-09 ENCOUNTER — Telehealth (INDEPENDENT_AMBULATORY_CARE_PROVIDER_SITE_OTHER): Payer: Medicare Other | Admitting: Cardiology

## 2019-04-09 VITALS — BP 156/86 | HR 62 | Ht 74.0 in | Wt 277.0 lb

## 2019-04-09 DIAGNOSIS — I10 Essential (primary) hypertension: Secondary | ICD-10-CM

## 2019-04-09 DIAGNOSIS — R001 Bradycardia, unspecified: Secondary | ICD-10-CM

## 2019-04-09 DIAGNOSIS — I4819 Other persistent atrial fibrillation: Secondary | ICD-10-CM

## 2019-04-09 NOTE — Patient Instructions (Signed)
Medication Instructions:  Your Physician recommend you continue on your current medication as directed.    *If you need a refill on your cardiac medications before your next appointment, please call your pharmacy*  Lab Work: None  Testing/Procedures: Will have scheduler call to reschedule ECHO  Follow-Up: At [facility], you and your health needs are our priority.  As part of our continuing mission to provide you with exceptional heart care, we have created designated Provider Care Teams.  These Care Teams include your primary Cardiologist (physician) and Advanced Practice Providers (APPs -  Physician Assistants and Nurse Practitioners) who all work together to provide you with the care you need, when you need it.  Your next appointment:   6 months  The format for your next appointment:   In Person  Provider:   Oswaldo Milian, MD

## 2019-04-09 NOTE — Progress Notes (Signed)
Virtual Visit via Video Note   This visit type was conducted due to national recommendations for restrictions regarding the COVID-19 Pandemic (e.g. social distancing) in an effort to limit this patient's exposure and mitigate transmission in our community.  Due to his co-morbid illnesses, this patient is at least at moderate risk for complications without adequate follow up.  This format is felt to be most appropriate for this patient at this time.  All issues noted in this document were discussed and addressed.  A limited physical exam was performed with this format.  Please refer to the patient's chart for his consent to telehealth for [facility].   Date:  04/09/2019   ID:  [name] Camp., DOB [birth year], MRN [id number]  Patient Location: Home Provider Location: Office  PCP:  Crist Infante, MD  Cardiologist:  No primary care provider on file.  Electrophysiologist:  None   Evaluation Performed:  Follow-Up Visit  Chief Complaint: Atrial fibrillation  History of Present Illness:    [name] Corzo. is a 78 y.o. male with AF/AFL s/p AFL ablation 05/04/12, CAD, HTN who presents for follow-up of his atrial fibrillation.  He was seen initially on 02/22/19 for bradycardia, was found to be in atrial firbillation with HR in 40s.  He had been on bystolic, which was discontinued.  He was started on Eliquis 5 mg BID and referred to EP for an evaluation.  He saw Dr Rayann Heman on 03/09/19, recommended pacemaker for symptomatic bradycardia.  Patient still deciding.  He reports that since his last clinic visit, he had a fall and injured his shoulder, and is awaiting MRI.  He otherwise has no complaints.  He denies any chest pain, dyspnea, palpitations, lightheadedness, syncope.  He has been checking his blood pressure intermittently, but does not have a log of measurements with him.  Reports his heart rate remains in 40s.    Past Medical History:  Diagnosis Date  . Atrial flutter [facility])    s/p CTI  ablation by Dr Lovena Le 2013  . Calculus of kidney   . Coronary atherosclerosis of unspecified type of vessel, native or graft   . Degeneration of cervical intervertebral disc   . Depression   . DJD (degenerative joint disease)   . GERD (gastroesophageal reflux disease)   . HTN (hypertension)   . Impotence of organic origin   . Inguinal hernia without mention of obstruction or gangrene, unilateral or unspecified, (not specified as recurrent)   . Insomnia, unspecified   . Intestinal infection due to Clostridium difficile   . Irritable bowel syndrome   . Kidney stone    "they just passed" (05/03/2012)  . Obesity, unspecified   . Partial deafness   . Sacroiliitis, not elsewhere classified ([place])   . Unspecified hereditary and idiopathic peripheral neuropathy    Past Surgical History:  Procedure Laterality Date  . APPENDECTOMY  ~ 1956  . ATRIAL FLUTTER ABLATION N/A 05/03/2012   CTI ablation by Dr Lovena Le in 2013  . CARDIAC CATHETERIZATION  08/1999; 05/03/2012   normal coronary arteries;   . CARPAL TUNNEL WITH CUBITAL TUNNEL  2004   "right" (05/03/2012)  . COLONOSCOPY  06/06/2008   severe sigmoid diverticulosis and internal hemorrhoids  . ESOPHAGOGASTRODUODENOSCOPY  06/06/2008   normal  . INGUINAL HERNIA REPAIR  03/23/11   "left" (05/03/2012)  . POSTERIOR LUMBAR FUSION  1989  . reconstruction (r) foot surgery     DR SUE   . RENAL CYST EXCISION  pt denies this hx on 05/03/2012  . [place]  . SPINE SURGERY  1969   Pantopaque Myelography and spinal infusion- Dr. Lyman Speller  . TONSILLECTOMY  ~ 1946  . TOTAL HIP ARTHROPLASTY  1999-2000   bilateral     Current Meds  Medication Sig  . amLODipine (NORVASC) 10 MG tablet Take 10 mg by mouth daily.  Marland Kitchen apixaban (ELIQUIS) 5 MG TABS tablet Take 1 tablet (5 mg total) by mouth 2 (two) times daily.  . Ascorbic Acid (VITAMIN C) 1000 MG tablet Take 1,000 mg by mouth daily.  . finasteride (PROSCAR) 5 MG tablet Take 5 mg by mouth daily.  Marland Kitchen  losartan-hydrochlorothiazide (HYZAAR) 100-25 MG tablet Take 1 tablet by mouth daily.     Allergies:   Amitriptyline, Azithromycin, and Flexeril [cyclobenzaprine]   Social History   Tobacco Use  . Smoking status: Former Smoker    Packs/day: 2.00    Years: 5.00    Pack years: 10.00    Types: Cigarettes  . Smokeless tobacco: Never Used  . Tobacco comment: 05/03/2012 "smoked from age 24 to 40"  Substance Use Topics  . Alcohol use: Yes    Comment: "1 margaritia a week"  . Drug use: No     Family Hx: The patient's family history includes Hypertension in his father. There is no history of Colon cancer.  ROS:   Please see the history of present illness.     All other systems reviewed and are negative.   Prior CV studies:   The following studies were reviewed today:    Labs/Other Tests and Data Reviewed:    EKG:  No ECG reviewed.  Recent Labs: 06/25/2018: Hemoglobin 12.0; Platelets 194 02/22/2019: BUN 25; Creatinine, Ser 1.56; Potassium 4.9; Sodium 140   Recent Lipid Panel Lab Results  Component Value Date/Time   CHOL 163 10/17/2017 08:07 AM   TRIG 97 10/17/2017 08:07 AM   HDL 45 10/17/2017 08:07 AM   CHOLHDL 3.6 10/17/2017 08:07 AM   LDLCALC 99 10/17/2017 08:07 AM    Wt Readings from Last 3 Encounters:  04/09/19 277 lb (125.6 kg)  03/09/19 275 lb (124.7 kg)  02/22/19 275 lb (124.7 kg)     Objective:    Vital Signs:  BP (!) 156/86   Pulse 62   Ht 6\' 2"  (1.88 m)   Wt 277 lb (125.6 kg)   BMI 35.56 kg/m    VITAL SIGNS:  reviewed  ASSESSMENT & PLAN:    Atrial fibrillation: h/o AFL s/p ablation in 2013 with Dr Lovena Le.  No recurrence of atrial flutter since that time, but now in Afib.  CHADS-VASc 3 (HTN, age x2) -He missed prior appointment for TTE, will reschedule - Continue Eliquis 5 mg BID - Not on anything for rate control given bradycardia while in AF  Symptomatic bradycardia: Dr Rayann Heman recommending pacemaker, patient is considering  HTN: on  amlodipine 10 mg daily and Losartan-HCTZ 100-25 mg is on med list, he is not sure he is taking this - Asked to check BP daily for next 2 weeks and call with results  RTC in 6 months  Time:   Today, I have spent 15 minutes with the patient with telehealth technology discussing the above problems.     Medication Adjustments/Labs and Tests Ordered: Current medicines are reviewed at length with the patient today.  Concerns regarding medicines are outlined above.   Tests Ordered: No orders of the defined types were placed in this encounter.  Medication Changes: No orders of the defined types were placed in this encounter.   Follow Up:  In Person in 6 month(s)  Signed, Donato Heinz, MD  04/09/2019 9:05 AM    [place]

## 2019-04-18 ENCOUNTER — Other Ambulatory Visit: Payer: Self-pay

## 2019-04-18 ENCOUNTER — Ambulatory Visit
Admission: RE | Admit: 2019-04-18 | Discharge: 2019-04-18 | Disposition: A | Discharge status: Home / Self Care | Payer: Medicare Other | Source: Ambulatory Visit | Attending: Orthopedic Surgery | Admitting: Orthopedic Surgery

## 2019-04-18 DIAGNOSIS — M25512 Pain in left shoulder: Secondary | ICD-10-CM

## 2019-04-20 ENCOUNTER — Encounter (HOSPITAL_COMMUNITY): Payer: Self-pay | Admitting: Cardiology

## 2019-04-20 ENCOUNTER — Other Ambulatory Visit (HOSPITAL_COMMUNITY): Payer: Medicare Other

## 2019-04-23 ENCOUNTER — Telehealth: Payer: Self-pay | Admitting: Internal Medicine

## 2019-04-23 NOTE — Telephone Encounter (Signed)
Patient is calling in regards to declining to schedule ECHO requested by Dr. Gardiner Rhyme. Patient states Dr. Rayann Heman informed him he does not need to have ECHO please advise.

## 2019-04-25 NOTE — Telephone Encounter (Signed)
Left message to call back  

## 2019-04-25 NOTE — Telephone Encounter (Signed)
[name] [name], can we try calling patient and letting him know that we do not need the echo?  I tried calling him but unable to reach Thanks, Gerald Stabs

## 2019-05-02 NOTE — Telephone Encounter (Signed)
ECHO scheduled for 12/8.

## 2019-05-08 ENCOUNTER — Other Ambulatory Visit: Payer: Self-pay

## 2019-05-08 ENCOUNTER — Ambulatory Visit (HOSPITAL_COMMUNITY): Payer: Medicare Other | Attending: Cardiology

## 2019-05-08 DIAGNOSIS — I4891 Unspecified atrial fibrillation: Secondary | ICD-10-CM | POA: Diagnosis not present

## 2019-05-10 ENCOUNTER — Ambulatory Visit: Payer: Medicare Other | Admitting: Internal Medicine

## 2019-05-14 ENCOUNTER — Ambulatory Visit (INDEPENDENT_AMBULATORY_CARE_PROVIDER_SITE_OTHER): Payer: Medicare Other | Admitting: Internal Medicine

## 2019-05-14 ENCOUNTER — Encounter: Payer: Self-pay | Admitting: Internal Medicine

## 2019-05-14 VITALS — Ht 74.0 in | Wt 279.0 lb

## 2019-05-14 DIAGNOSIS — I4819 Other persistent atrial fibrillation: Secondary | ICD-10-CM | POA: Diagnosis not present

## 2019-05-14 DIAGNOSIS — I10 Essential (primary) hypertension: Secondary | ICD-10-CM | POA: Diagnosis not present

## 2019-05-14 DIAGNOSIS — R001 Bradycardia, unspecified: Secondary | ICD-10-CM | POA: Diagnosis not present

## 2019-05-14 NOTE — Progress Notes (Signed)
Electrophysiology TeleHealth Note  Due to national recommendations of social distancing due to [place] 19, an audio telehealth visit is felt to be most appropriate for this patient at this time.  Verbal consent was obtained by me for the telehealth visit today.  The patient does not have capability for a virtual visit.  A phone visit is therefore required today.   Date:  05/14/2019   ID:  [name] [name]., DOB 08/05/1940, MRN [id number]  Location: patient's home  Provider location:  [facility]  Evaluation Performed: Follow-up visit  PCP:  Crist Infante, MD   Electrophysiologist:  Dr Rayann Heman  Chief Complaint:  bradycardia  History of Present Illness:    [name]. is a 78 y.o. male who presents via telehealth conferencing today.  Since last being seen in our clinic, the patient reports doing reasonably well.  He has had an overall decline over the past year.  He attributes this to COVID 19 and staying at home.  He feels that he has become deconditioned.  He has fatigue but is reasonably active.  Today, he denies symptoms of palpitations, chest pain, shortness of breath,  lower extremity edema, dizziness, presyncope, or syncope.  The patient is otherwise without complaint today.    Past Medical History:  Diagnosis Date  . Atrial flutter [facility])    s/p CTI ablation by Dr Lovena Le 2013  . Calculus of kidney   . Coronary atherosclerosis of unspecified type of vessel, native or graft   . Degeneration of cervical intervertebral disc   . Depression   . DJD (degenerative joint disease)   . GERD (gastroesophageal reflux disease)   . HTN (hypertension)   . Impotence of organic origin   . Inguinal hernia without mention of obstruction or gangrene, unilateral or unspecified, (not specified as recurrent)   . Insomnia, unspecified   . Intestinal infection due to Clostridium difficile   . Irritable bowel syndrome   . Kidney stone    "they just passed" (05/03/2012)  . Obesity,  unspecified   . Partial deafness   . Sacroiliitis, not elsewhere classified ([place])   . Unspecified hereditary and idiopathic peripheral neuropathy     Past Surgical History:  Procedure Laterality Date  . APPENDECTOMY  ~ 1956  . ATRIAL FLUTTER ABLATION N/A 05/03/2012   CTI ablation by Dr Lovena Le in 2013  . CARDIAC CATHETERIZATION  08/1999; 05/03/2012   normal coronary arteries;   . CARPAL TUNNEL WITH CUBITAL TUNNEL  2004   "right" (05/03/2012)  . COLONOSCOPY  06/06/2008   severe sigmoid diverticulosis and internal hemorrhoids  . ESOPHAGOGASTRODUODENOSCOPY  06/06/2008   normal  . INGUINAL HERNIA REPAIR  03/23/11   "left" (05/03/2012)  . POSTERIOR LUMBAR FUSION  1989  . reconstruction (r) foot surgery     DR SUE   . RENAL CYST EXCISION     pt denies this hx on 05/03/2012  . [place]  . SPINE SURGERY  1969   Pantopaque Myelography and spinal infusion- Dr. Lyman Speller  . TONSILLECTOMY  ~ 1946  . TOTAL HIP ARTHROPLASTY  1999-2000   bilateral    Current Outpatient Medications  Medication Sig Dispense Refill  . amLODipine (NORVASC) 10 MG tablet Take 10 mg by mouth daily.    Marland Kitchen apixaban (ELIQUIS) 5 MG TABS tablet Take 1 tablet (5 mg total) by mouth 2 (two) times daily. 60 tablet 6  . Ascorbic Acid (VITAMIN C) 1000 MG tablet Take 1,000 mg  by mouth daily.    . finasteride (PROSCAR) 5 MG tablet Take 5 mg by mouth daily.    Marland Kitchen gabapentin (NEURONTIN) 100 MG capsule Take 100 mg by mouth 4 (four) times daily.     Marland Kitchen losartan-hydrochlorothiazide (HYZAAR) 100-25 MG tablet Take 1 tablet by mouth daily.    Marland Kitchen oxyCODONE (OXY IR/ROXICODONE) 5 MG immediate release tablet TAKE 1 2 TABS BY MOUTH UP TO FOUR TIMES A DAY AS NEEDED FOR SEVERE PAIN DX  G89.29 M25.511, M54.9     No current facility-administered medications for this visit.    Allergies:   Amitriptyline, Azithromycin, and Flexeril [cyclobenzaprine]   Social History:  The patient  reports that he has quit smoking. His smoking use included  cigarettes. He has a 10.00 pack-year smoking history. He has never used smokeless tobacco. He reports current alcohol use. He reports that he does not use drugs.   Family History:  The patient's family history includes Hypertension in his father.   ROS:  Please see the history of present illness.   All other systems are personally reviewed and negative.    Exam:    Vital Signs:  Ht 6\' 2"  (1.88 m)   Wt 279 lb (126.6 kg)   BMI 35.82 kg/m   Well sounding, alert and conversant,     Labs/Other Tests and Data Reviewed:    Recent Labs: 06/25/2018: Hemoglobin 12.0; Platelets 194 02/22/2019: BUN 25; Creatinine, Ser 1.56; Potassium 4.9; Sodium 140   Wt Readings from Last 3 Encounters:  05/14/19 279 lb (126.6 kg)  04/09/19 277 lb (125.6 kg)  03/09/19 275 lb (124.7 kg)     ekg 02/27/19 reviewed Echo reviewed  ASSESSMENT & PLAN:    1. Symptomatic bradycardia I have discussed at length with him.  He would be a candidate for PPM (transvenous or leadless).  He is clear in his decision to avoid pacing at this time.  (he was previously scheduled for PPM but cancelled the procedure).  He has not had syncope or presyncope.  Given his preference, we should all respect his wishes. If he changes his mind, he can contact my office.  2. Persistent afib Rate control long term Continue eliquis  3 HTN Stable No change required today   Follow-up:  6 months with EP NP.  If at that time he declines pacing, we will plan to see prn thereafter. I will see as needed going forward   Patient Risk:  after full review of this patients clinical status, I feel that they are at moderate risk at this time.  Today, I have spent 15 minutes with the patient with telehealth technology discussing arrhythmia management .    Army Fossa, MD  05/14/2019 2:46 PM     [place] [address] [phone] (office) [phone] (fax)

## 2019-06-25 ENCOUNTER — Encounter: Payer: Medicare Other | Admitting: Internal Medicine

## 2019-08-06 ENCOUNTER — Encounter: Payer: Self-pay | Admitting: Podiatry

## 2019-08-06 ENCOUNTER — Other Ambulatory Visit: Payer: Self-pay

## 2019-08-06 ENCOUNTER — Ambulatory Visit: Payer: Medicare Other | Admitting: Podiatry

## 2019-08-06 ENCOUNTER — Ambulatory Visit: Payer: Medicare Other

## 2019-08-06 VITALS — Temp 98.0°F

## 2019-08-06 DIAGNOSIS — M79674 Pain in right toe(s): Secondary | ICD-10-CM

## 2019-08-06 DIAGNOSIS — Q828 Other specified congenital malformations of skin: Secondary | ICD-10-CM | POA: Diagnosis not present

## 2019-08-06 DIAGNOSIS — M2041 Other hammer toe(s) (acquired), right foot: Secondary | ICD-10-CM

## 2019-08-09 NOTE — Progress Notes (Signed)
Subjective:   Patient ID: [name] Camp., male   DOB: 79 y.o.   MRN: [id number]   HPI Patient states the top of his toe is really bothering him and also he wants to know what would be a definitive procedure that may fix this problem   ROS      Objective:  Physical Exam  Neurovascular status intact with patient found to have a painful keratotic lesion right dorsal toe and I noted there to be deformity of the digit with deformity at both the proximal distal interphalangeal joint     Assessment:  Chronic hammertoe deformity third digit right with digital keratotic lesion painful when palpated     Plan:  H&P condition reviewed and at this point sterile sharp debridement of lesion accomplished with no iatrogenic bleeding and recommended we continue this type of treatment and I did discuss hammertoe repair and I discussed to joint involvement with consideration for arthroplasty or possible fusion which I educated him on today signed visit

## 2019-08-17 ENCOUNTER — Telehealth: Payer: Self-pay | Admitting: *Deleted

## 2019-08-17 NOTE — Telephone Encounter (Signed)
Left message requesting pt to call again, but if he had redness, swelling and drainage he would benefit from and appt, that on occasion when a hard core or hard skin is removed the new skin beneath is very sensitive, but if he had no redness, swelling or drainage he could pad the area off with a non-medicated corn pad or come to the office for a toe shield or call again.

## 2019-08-17 NOTE — Telephone Encounter (Signed)
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

## 2019-09-10 ENCOUNTER — Telehealth: Payer: Self-pay | Admitting: Internal Medicine

## 2019-09-10 NOTE — Telephone Encounter (Signed)
Patient calling stating he had discussed getting a pacemaker with Dr. Rayann Heman if his HR stayed low. He states his HR has stayed low and would like to discuss his options. Please advise.

## 2019-09-12 NOTE — Progress Notes (Deleted)
Electrophysiology Office Note Date: 09/12/2019  ID:  [name] Mai Ammiel Talford., DOB 1941/04/29, MRN [id number]  PCP: Crist Infante, MD Primary Cardiologist: Gardiner Rhyme Electrophysiologist: Rayann Heman  CC: bradycardia  [name] [name]. is a 79 y.o. male seen today for Dr Rayann Heman.  He presents today for routine electrophysiology followup.  Since last being seen in our clinic, the patient reports doing very well.  He denies chest pain, palpitations, dyspnea, PND, orthopnea, nausea, vomiting, dizziness, syncope, edema, weight gain, or early satiety.  Past Medical History:  Diagnosis Date  . Atrial flutter [facility])    s/p CTI ablation by Dr Lovena Le 2013  . Calculus of kidney   . Coronary atherosclerosis of unspecified type of vessel, native or graft   . Degeneration of cervical intervertebral disc   . Depression   . DJD (degenerative joint disease)   . GERD (gastroesophageal reflux disease)   . HTN (hypertension)   . Impotence of organic origin   . Inguinal hernia without mention of obstruction or gangrene, unilateral or unspecified, (not specified as recurrent)   . Insomnia, unspecified   . Intestinal infection due to Clostridium difficile   . Irritable bowel syndrome   . Kidney stone    "they just passed" (05/03/2012)  . Obesity, unspecified   . Partial deafness   . Sacroiliitis, not elsewhere classified ([place])   . Unspecified hereditary and idiopathic peripheral neuropathy    Past Surgical History:  Procedure Laterality Date  . APPENDECTOMY  ~ 1956  . ATRIAL FLUTTER ABLATION N/A 05/03/2012   CTI ablation by Dr Lovena Le in 2013  . CARDIAC CATHETERIZATION  08/1999; 05/03/2012   normal coronary arteries;   . CARPAL TUNNEL WITH CUBITAL TUNNEL  2004   "right" (05/03/2012)  . COLONOSCOPY  06/06/2008   severe sigmoid diverticulosis and internal hemorrhoids  . ESOPHAGOGASTRODUODENOSCOPY  06/06/2008   normal  . INGUINAL HERNIA REPAIR  03/23/11   "left" (05/03/2012)  . POSTERIOR LUMBAR FUSION  1989  .  reconstruction (r) foot surgery     DR SUE   . RENAL CYST EXCISION     pt denies this hx on 05/03/2012  . [place]  . SPINE SURGERY  1969   Pantopaque Myelography and spinal infusion- Dr. Lyman Speller  . TONSILLECTOMY  ~ 1946  . TOTAL HIP ARTHROPLASTY  1999-2000   bilateral    Current Outpatient Medications  Medication Sig Dispense Refill  . amLODipine (NORVASC) 10 MG tablet Take 10 mg by mouth daily.    Marland Kitchen apixaban (ELIQUIS) 5 MG TABS tablet Take 1 tablet (5 mg total) by mouth 2 (two) times daily. 60 tablet 6  . Ascorbic Acid (VITAMIN C) 1000 MG tablet Take 1,000 mg by mouth daily.    Marland Kitchen BYSTOLIC 10 MG tablet Take 10 mg by mouth daily.    . finasteride (PROSCAR) 5 MG tablet Take 5 mg by mouth daily.    . fluocinonide cream (LIDEX) 0.05 % APPLY TO AFFECTED AREA 3 TIMES A DAY AS NEEDED    . gabapentin (NEURONTIN) 100 MG capsule Take 100 mg by mouth 4 (four) times daily.     Marland Kitchen losartan-hydrochlorothiazide (HYZAAR) 100-25 MG tablet Take 1 tablet by mouth daily.    . ondansetron (ZOFRAN) 4 MG tablet Take 4 mg by mouth every 8 (eight) hours as needed.    Marland Kitchen oxyCODONE (OXY IR/ROXICODONE) 5 MG immediate release tablet TAKE 1 2 TABS BY MOUTH UP TO FOUR TIMES A DAY AS NEEDED FOR  SEVERE PAIN DX  G89.29 M25.511, M54.9    . triamcinolone cream (KENALOG) 0.1 % APPLY TO SKIN 2 TO 3 TIMES A DAY AS NEEDED FOR 2 WEEKS     No current facility-administered medications for this visit.    Allergies:   Amitriptyline, Azithromycin, and Flexeril [cyclobenzaprine]   Social History: Social History   Socioeconomic History  . Marital status: Married    Spouse name: Not on file  . Number of children: Not on file  . Years of education: Not on file  . Highest education level: Not on file  Occupational History  . Not on file  Tobacco Use  . Smoking status: Former Smoker    Packs/day: 2.00    Years: 5.00    Pack years: 10.00    Types: Cigarettes  . Smokeless tobacco: Never Used  . Tobacco comment:  05/03/2012 "smoked from age 56 to 22"  Substance and Sexual Activity  . Alcohol use: Yes    Comment: "1 margaritia a week"  . Drug use: No  . Sexual activity: Yes    Partners: Female  Other Topics Concern  . Not on file  Social History Narrative          Social Determinants of Health   Financial Resource Strain:   . Difficulty of Paying Living Expenses:   Food Insecurity:   . Worried About Charity fundraiser in the Last Year:   . Arboriculturist in the Last Year:   Transportation Needs:   . Film/video editor (Medical):   Marland Kitchen Lack of Transportation (Non-Medical):   Physical Activity:   . Days of Exercise per Week:   . Minutes of Exercise per Session:   Stress:   . Feeling of Stress :   Social Connections:   . Frequency of Communication with Friends and Family:   . Frequency of Social Gatherings with Friends and Family:   . Attends Religious Services:   . Active Member of Clubs or Organizations:   . Attends Archivist Meetings:   Marland Kitchen Marital Status:   Intimate Partner Violence:   . Fear of Current or Ex-Partner:   . Emotionally Abused:   Marland Kitchen Physically Abused:   . Sexually Abused:     Family History: Family History  Problem Relation Age of Onset  . Hypertension Father   . Colon cancer Neg Hx     Review of Systems: All other systems reviewed and are otherwise negative except as noted above.   Physical Exam: VS:  There were no vitals taken for this visit. , BMI There is no height or weight on file to calculate BMI. Wt Readings from Last 3 Encounters:  05/14/19 279 lb (126.6 kg)  04/09/19 277 lb (125.6 kg)  03/09/19 275 lb (124.7 kg)    GEN- The patient is well appearing, alert and oriented x 3 today.   HEENT: normocephalic, atraumatic; sclera clear, conjunctiva pink; hearing intact; oropharynx clear; neck supple, no JVP Lymph- no cervical lymphadenopathy Lungs- Clear to ausculation bilaterally, normal work of breathing.  No wheezes, rales,  rhonchi Heart- Regular rate and rhythm, no murmurs, rubs or gallops, PMI not laterally displaced GI- soft, non-tender, non-distended, bowel sounds present, no hepatosplenomegaly Extremities- no clubbing, cyanosis, or edema; DP/PT/radial pulses 2+ bilaterally MS- no significant deformity or atrophy Skin- warm and dry, no rash or lesion  Psych- euthymic mood, full affect Neuro- strength and sensation are intact   EKG:  EKG is ordered today. The ekg ordered  today shows ***  Recent Labs: 02/22/2019: BUN 25; Creatinine, Ser 1.56; Potassium 4.9; Sodium 140    Other studies Reviewed: Additional studies/ records that were reviewed today include: Dr Jackalyn Lombard office notes  Assessment and Plan:  1.  Symptomatic bradycardia He has an indication for PPM implant and had previously declined He would now like *** Risks, benefits to pacemaker implant reviewed with the patient today.  We discussed leadless vs transvenous pacemakers. ***  2.  Persistent atrial fibrillation Continue Eliquis Plan rate control strategy  3.  HTN Stable No change required today    Current medicines are reviewed at length with the patient today.   The patient {ACTIONS; HAS/DOES NOT HAVE:19233} concerns regarding his medicines.  The following changes were made today:  {NONE DEFAULTED:18576::"none"}  Labs/ tests ordered today include: *** No orders of the defined types were placed in this encounter.    Disposition:   Follow up with *** {gen number [id number] {TIME; UNITS DAY/WEEK/MONTH:19136}   Signed, Chanetta Marshall, NP 09/12/2019 10:15 AM   [facility] [address] [place] [address] [phone] (office) [phone] (fax)

## 2019-09-14 ENCOUNTER — Ambulatory Visit: Payer: Medicare Other | Admitting: Nurse Practitioner

## 2019-09-19 ENCOUNTER — Telehealth (INDEPENDENT_AMBULATORY_CARE_PROVIDER_SITE_OTHER): Payer: Medicare Other | Admitting: Internal Medicine

## 2019-09-19 ENCOUNTER — Encounter: Payer: Self-pay | Admitting: Internal Medicine

## 2019-09-19 ENCOUNTER — Telehealth: Payer: Self-pay

## 2019-09-19 ENCOUNTER — Other Ambulatory Visit: Payer: Self-pay

## 2019-09-19 VITALS — BP 171/80 | HR 49 | Ht 74.0 in | Wt 263.0 lb

## 2019-09-19 DIAGNOSIS — I4819 Other persistent atrial fibrillation: Secondary | ICD-10-CM

## 2019-09-19 DIAGNOSIS — Z01810 Encounter for preprocedural cardiovascular examination: Secondary | ICD-10-CM

## 2019-09-19 DIAGNOSIS — Z79899 Other long term (current) drug therapy: Secondary | ICD-10-CM

## 2019-09-19 DIAGNOSIS — R001 Bradycardia, unspecified: Secondary | ICD-10-CM | POA: Diagnosis not present

## 2019-09-19 DIAGNOSIS — I119 Hypertensive heart disease without heart failure: Secondary | ICD-10-CM | POA: Diagnosis not present

## 2019-09-19 DIAGNOSIS — D6869 Other thrombophilia: Secondary | ICD-10-CM

## 2019-09-19 DIAGNOSIS — I4891 Unspecified atrial fibrillation: Secondary | ICD-10-CM

## 2019-09-19 NOTE — Telephone Encounter (Signed)
-----   Message from Thompson Grayer, MD sent at 09/19/2019 10:33 AM EDT ----- Please call and schedule for leadless pacemaker.  [place] is moving [place].  He is not sure whether to do prior to his move or after.   I would advise after as this is not urgent.   Hillard Danker,  you could see if he would be willing to consider the nanostim leadless device.

## 2019-09-19 NOTE — Progress Notes (Signed)
Electrophysiology TeleHealth Note  Due to national recommendations of social distancing due to [place] 19, an audio telehealth visit is felt to be most appropriate for this patient at this time.  Verbal consent was obtained by me for the telehealth visit today.  The patient does not have capability for a virtual visit.  A phone visit is therefore required today.   Date:  09/19/2019   ID:  [name] [name]., DOB 05-31-1941, MRN [id number]  Location: patient's home  Provider location:  [place]  Evaluation Performed: Follow-up visit  PCP:  Crist Infante, MD   Electrophysiologist:  Dr Rayann Heman  Chief Complaint:  palpitations  History of Present Illness:    [name] [name]. is a 79 y.o. male who presents via telehealth conferencing today.  Since last being seen in our clinic, the patient reports doing reasonably well. He is planning to move to [facility] next week.  He is frequently SOB and has reduced exercise tolerance.   Today, he denies symptoms of palpitations, chest pain,  lower extremity edema, dizziness, presyncope, or syncope.  The patient is otherwise without complaint today.   Past Medical History:  Diagnosis Date  . Atrial flutter [facility])    s/p CTI ablation by Dr Lovena Le 2013  . Calculus of kidney   . Coronary atherosclerosis of unspecified type of vessel, native or graft   . Degeneration of cervical intervertebral disc   . Depression   . DJD (degenerative joint disease)   . GERD (gastroesophageal reflux disease)   . HTN (hypertension)   . Impotence of organic origin   . Inguinal hernia without mention of obstruction or gangrene, unilateral or unspecified, (not specified as recurrent)   . Insomnia, unspecified   . Intestinal infection due to Clostridium difficile   . Irritable bowel syndrome   . Kidney stone    "they just passed" (05/03/2012)  . Obesity, unspecified   . Partial deafness   . Sacroiliitis, not elsewhere classified ([place])   . Unspecified  hereditary and idiopathic peripheral neuropathy     Past Surgical History:  Procedure Laterality Date  . APPENDECTOMY  ~ 1956  . ATRIAL FLUTTER ABLATION N/A 05/03/2012   CTI ablation by Dr Lovena Le in 2013  . CARDIAC CATHETERIZATION  08/1999; 05/03/2012   normal coronary arteries;   . CARPAL TUNNEL WITH CUBITAL TUNNEL  2004   "right" (05/03/2012)  . COLONOSCOPY  06/06/2008   severe sigmoid diverticulosis and internal hemorrhoids  . ESOPHAGOGASTRODUODENOSCOPY  06/06/2008   normal  . INGUINAL HERNIA REPAIR  03/23/11   "left" (05/03/2012)  . POSTERIOR LUMBAR FUSION  1989  . reconstruction (r) foot surgery     DR SUE   . RENAL CYST EXCISION     pt denies this hx on 05/03/2012  . [place]  . SPINE SURGERY  1969   Pantopaque Myelography and spinal infusion- Dr. Lyman Speller  . TONSILLECTOMY  ~ 1946  . TOTAL HIP ARTHROPLASTY  1999-2000   bilateral    Current Outpatient Medications  Medication Sig Dispense Refill  . apixaban (ELIQUIS) 5 MG TABS tablet Take 1 tablet (5 mg total) by mouth 2 (two) times daily. 60 tablet 6  . Ascorbic Acid (VITAMIN C) 1000 MG tablet Take 1,000 mg by mouth daily.    . finasteride (PROSCAR) 5 MG tablet Take 5 mg by mouth daily.    . fluocinonide cream (LIDEX) 0.05 % APPLY TO AFFECTED AREA 3 TIMES A DAY AS  NEEDED    . gabapentin (NEURONTIN) 100 MG capsule Take 100 mg by mouth 4 (four) times daily.     Marland Kitchen losartan-hydrochlorothiazide (HYZAAR) 100-25 MG tablet Take 1 tablet by mouth daily.    . ondansetron (ZOFRAN) 4 MG tablet Take 4 mg by mouth every 8 (eight) hours as needed.    Marland Kitchen oxyCODONE (OXY IR/ROXICODONE) 5 MG immediate release tablet TAKE 1 2 TABS BY MOUTH UP TO FOUR TIMES A DAY AS NEEDED FOR SEVERE PAIN DX  G89.29 M25.511, M54.9    . triamcinolone cream (KENALOG) 0.1 % APPLY TO SKIN 2 TO 3 TIMES A DAY AS NEEDED FOR 2 WEEKS    . furosemide (LASIX) 20 MG tablet Take 20 mg by mouth daily.     No current facility-administered medications for this visit.     Allergies:   Amitriptyline, Azithromycin, and Flexeril [cyclobenzaprine]   Social History:  The patient  reports that he has quit smoking. His smoking use included cigarettes. He has a 10.00 pack-year smoking history. He has never used smokeless tobacco. He reports current alcohol use. He reports that he does not use drugs.   Family History:  The patient's family history includes Hypertension in his father.   ROS:  Please see the history of present illness.   All other systems are personally reviewed and negative.    Exam:    Vital Signs:  BP (!) 171/80   Pulse (!) 49   Ht 6\' 2"  (1.88 m)   Wt 263 lb (119.3 kg)   BMI 33.77 kg/m   Well sounding, alert and conversant   Labs/Other Tests and Data Reviewed:    Recent Labs: 02/22/2019: BUN 25; Creatinine, Ser 1.56; Potassium 4.9; Sodium 140   Wt Readings from Last 3 Encounters:  09/19/19 263 lb (119.3 kg)  05/14/19 279 lb (126.6 kg)  04/09/19 277 lb (125.6 kg)      ASSESSMENT & PLAN:    1.  Symptomatic bradycardia The patient has symptomatic bradycardia.  He appears to have longstanding persistent afib.   I would therefore recommend pacemaker implantation at this time.  We discussed pros and cons to both leadless and transvenous pacing.  He would like to proceed with leadless pacing.  I will ask research to reach out to him regarding nanostim device.  He is aware that if we cannot place a leadless device then we would proceed with transvenous device instead.  Risks, benefits, alternatives to pacemaker implantation (both leadless and transvenous) were discussed in detail with the patient today. The patient understands that the risks include but are not limited to bleeding, infection, pneumothorax, perforation, tamponade, vascular damage, renal failure, MI, stroke, death,  and lead dislodgement and wishes to proceed. We will therefore schedule the procedure at the next available time.    2. Persistent atrial fibrillation He is  persistently in afib Hold eliquis 24 hours prior to PPM implant Ultimately, we may consider sinus rhythm if he continues to feel poorly post PPM  3. Hypertensive cardiovascular disease Stable No change required today    Patient Risk:  after full review of this patients clinical status, I feel that they are at moderate risk at this time.  Today, I have spent 15 minutes with the patient with telehealth technology discussing arrhythmia management .    Army Fossa, MD  09/19/2019 10:24 AM     [facility] [address] [place] [address] [phone] (office) [phone] (fax)

## 2019-09-20 ENCOUNTER — Encounter: Payer: Self-pay | Admitting: *Deleted

## 2019-09-20 DIAGNOSIS — R001 Bradycardia, unspecified: Secondary | ICD-10-CM

## 2019-09-20 DIAGNOSIS — Z006 Encounter for examination for normal comparison and control in clinical research program: Secondary | ICD-10-CM

## 2019-09-20 NOTE — Research (Signed)
I spoke to patient and he is interested in having the [place] pacemaker implanted as soon as possible. He is moving to [place] the first week of May but surgery can be scheduled after 10-08-19. Dr. Jackalyn Lombard scheduler will contact patient to arrange surgery. No preprocedural testing or studies have been done as of today; only research screening.    Leadless II - AVEIR Study Inclusion and Exclusion  Inclusion Criteria Eligible subjects will meet all of the following:   YES [x]   NO  []   Subject must have one of the clinical indications in adherence with Medicare, ACC/AHA/HRS/ESC single chamber pacing  guidelines including:                 [x]      Chronic and/or permanent atrial fibrillation with 2 or 3 AV or bifascicular bundle branch block (BBB block), including slow ventricular rates (with or without medication) associated with atrial fibrillation; or                 []      Normal sinus rhythm with 2 or 3 AV or BBB block and a low level of physical activity or short expected lifespan (but at least one year); or     []      Sinus bradycardia with infrequent pauses or unexplained syncope with EP findings; and   YES [x]   NO []   Subject is ?80 years of age; and   YES  [x]   NO []   Subject has a life expectancy of at least one year; and   YES  [x]   NO []   Subject is not be enrolled in another clinical investigation; and   YES  [x]   NO  []   Subject is willing to comply with clinical investigation procedures and agrees to return for all required follow-up visits, tests, and exams; and   YES  [x]   NO  []   Subject has been informed of the nature of the study, agrees to its provisions and has provided a signed written informed consent, approved by the IRB; and   Subject is not pregnant and does not plan to get pregnant during the course of the study.                            [x]       N/A (Men or women with no childbearing potential)   Exclusion Criteria Subjects will be excluded  if they meet any of the following:   YES []   NO [x]   Subject has pacemaker syndrome, has retrograde VA conduction, or suffers a drop in arterial blood pressure with the onset of ventricular pacing; or   YES []   NO  [x]   Subject is allergic or hypersensitive to < 1 mg of dexamethasone sodium phosphate (DSP);  YES []   NO [x]   Subject has a mechanical tricuspid valve prosthesis; or   YES []   NO [x]   Subject has a pre-existing pacing or defibrillation leads; or   YES []   NO [x]   Subject has current implantation of either conventional or subcutaneous implantable cardioverter defibrillator (ICD) or cardiac resynchronization therapy (CRT) device; or   YES []   NO [x]   Subject has an implanted vena cava filter; or   YES []   NO [x]   Subject has evidence of thrombosis in one of the veins used for access during the procedure; or   YES []   NO [x]   Subject has an implanted leadless cardiac pacemaker.  YES []   NO [x]   Subject has had recent cardiovascular or peripheral vascular surgery within 30 days of enrollment.    Patient screened by:   Leota Jacobsen, BSN, RN                Date: 09-19-2019

## 2019-09-20 NOTE — Telephone Encounter (Signed)
Follow up   Per previous message patient would like a call to setup a date for procedure.

## 2019-10-04 NOTE — Telephone Encounter (Signed)
Follow up   Patient would like a call back in reference to the previous messages.

## 2019-10-04 NOTE — Telephone Encounter (Signed)
I left the patient a message informing him that I would have Dr Jackalyn Lombard nurse Sonia Baller call him on 5/7 to further discuss procedure, instructions and lab work timeframe.

## 2019-10-07 ENCOUNTER — Other Ambulatory Visit: Payer: Self-pay | Admitting: Cardiology

## 2019-10-08 NOTE — Telephone Encounter (Signed)
Eliquis 5mg  refill request received. Patient is 79 years old, weight-119.3kg, Crea-1.56 on 02/22/2019, Diagnosis-Aflutter, and last seen by Dr. Rayann Heman via Telemedicine on 09/19/2019. Dose is appropriate based on dosing criteria. Will send in refill to requested pharmacy.

## 2019-10-08 NOTE — Telephone Encounter (Signed)
LMTCB RE: the pts 11/13/19 Pacer implant with covid test and labs on 11/09/19.

## 2019-10-11 NOTE — Telephone Encounter (Signed)
Call placed to Pt to advise of appointment date/time.  Pt states he can't get to hospital at 5:30 am from [facility]  Rescheduled Pt for 2nd case.  Remailing instruction letter.

## 2019-10-11 NOTE — Telephone Encounter (Signed)
Pt called back to discuss upcoming dates for procedure

## 2019-10-12 ENCOUNTER — Telehealth: Payer: Self-pay | Admitting: Internal Medicine

## 2019-10-12 NOTE — Telephone Encounter (Signed)
I spoke to the patient who called and asked if he was going home on same day of procedure.  I verified "yes" to the patient.  He verbalized understanding.

## 2019-10-12 NOTE — Telephone Encounter (Signed)
Patient called and wanted to speak to Sonia Baller about his upcoming procedure. He was worried about being discharged the same day. Please call to discuss what he needs to expect post procedure

## 2019-11-05 ENCOUNTER — Telehealth: Payer: Self-pay | Admitting: Internal Medicine

## 2019-11-09 ENCOUNTER — Other Ambulatory Visit: Payer: Medicare Other | Admitting: *Deleted

## 2019-11-09 ENCOUNTER — Other Ambulatory Visit: Payer: Self-pay

## 2019-11-09 ENCOUNTER — Other Ambulatory Visit (HOSPITAL_COMMUNITY)
Admission: RE | Admit: 2019-11-09 | Discharge: 2019-11-09 | Disposition: A | Discharge status: Home / Self Care | Payer: Medicare Other | Source: Ambulatory Visit | Attending: Internal Medicine | Admitting: Internal Medicine

## 2019-11-09 DIAGNOSIS — Z01812 Encounter for preprocedural laboratory examination: Secondary | ICD-10-CM | POA: Insufficient documentation

## 2019-11-09 DIAGNOSIS — I4819 Other persistent atrial fibrillation: Secondary | ICD-10-CM

## 2019-11-09 DIAGNOSIS — Z01810 Encounter for preprocedural cardiovascular examination: Secondary | ICD-10-CM

## 2019-11-09 DIAGNOSIS — Z20822 Contact with and (suspected) exposure to covid-19: Secondary | ICD-10-CM | POA: Insufficient documentation

## 2019-11-09 DIAGNOSIS — Z79899 Other long term (current) drug therapy: Secondary | ICD-10-CM

## 2019-11-09 DIAGNOSIS — I4891 Unspecified atrial fibrillation: Secondary | ICD-10-CM

## 2019-11-09 LAB — CBC WITH DIFFERENTIAL/PLATELET
Basophils Absolute: 0.1 10*3/uL (ref 0.0–0.2)
Basos: 1 %
EOS (ABSOLUTE): 0.3 10*3/uL (ref 0.0–0.4)
Eos: 7 %
Hematocrit: 35.7 % — ABNORMAL LOW (ref 37.5–51.0)
Hemoglobin: 11.4 g/dL — ABNORMAL LOW (ref 13.0–17.7)
Immature Grans (Abs): 0 10*3/uL (ref 0.0–0.1)
Immature Granulocytes: 0 %
Lymphocytes Absolute: 1.2 10*3/uL (ref 0.7–3.1)
Lymphs: 25 %
MCH: 29.4 pg (ref 26.6–33.0)
MCHC: 31.9 g/dL (ref 31.5–35.7)
MCV: 92 fL (ref 79–97)
Monocytes Absolute: 0.7 10*3/uL (ref 0.1–0.9)
Monocytes: 14 %
Neutrophils Absolute: 2.4 10*3/uL (ref 1.4–7.0)
Neutrophils: 53 %
Platelets: 168 10*3/uL (ref 150–450)
RBC: 3.88 x10E6/uL — ABNORMAL LOW (ref 4.14–5.80)
RDW: 14 % (ref 11.6–15.4)
WBC: 4.7 10*3/uL (ref 3.4–10.8)

## 2019-11-09 LAB — BASIC METABOLIC PANEL WITH GFR
BUN/Creatinine Ratio: 14 (ref 10–24)
BUN: 23 mg/dL (ref 8–27)
CO2: 22 mmol/L (ref 20–29)
Calcium: 9.3 mg/dL (ref 8.6–10.2)
Chloride: 105 mmol/L (ref 96–106)
Creatinine, Ser: 1.62 mg/dL — ABNORMAL HIGH (ref 0.76–1.27)
GFR calc Af Amer: 46 mL/min/1.73 — ABNORMAL LOW
GFR calc non Af Amer: 40 mL/min/1.73 — ABNORMAL LOW
Glucose: 95 mg/dL (ref 65–99)
Potassium: 4.5 mmol/L (ref 3.5–5.2)
Sodium: 144 mmol/L (ref 134–144)

## 2019-11-09 LAB — SARS CORONAVIRUS 2 (TAT 6-24 HRS): SARS Coronavirus 2: NEGATIVE

## 2019-11-11 ENCOUNTER — Telehealth: Payer: Self-pay | Admitting: Student

## 2019-11-11 NOTE — Telephone Encounter (Signed)
   Received page from Answering Service that patient had concerns about upcoming PPM implantation this week. Called and spoke with patient. He is scheduled to have a leadless pacemaker placed on Tuesday 11/13/2019 and was reading some of the post procedure instructions which said he will not be able to lift anything greater than 10 lbs for a while after the procedures. He states he weighs 250 lbs and has to push up on both arms in order to get out of a chair so he was concerned about this. I discussed with Dr. Rayann Heman who stated that since he will be getting leadless pacemaker and they go in through the groin with, he will not have those upper extremity restrictions and should be fine. Relayed this message to the message who was very relieved.   Darreld Mclean, PA-C 11/11/2019 9:01 AM

## 2019-11-13 ENCOUNTER — Ambulatory Visit (HOSPITAL_COMMUNITY): Payer: Medicare Other | Admitting: Certified Registered Nurse Anesthetist

## 2019-11-13 ENCOUNTER — Encounter (HOSPITAL_COMMUNITY): Payer: Self-pay | Admitting: Internal Medicine

## 2019-11-13 ENCOUNTER — Telehealth: Payer: Self-pay

## 2019-11-13 ENCOUNTER — Ambulatory Visit (HOSPITAL_COMMUNITY)
Admission: RE | Admit: 2019-11-13 | Discharge: 2019-11-13 | Disposition: A | Discharge status: Home / Self Care | Payer: Medicare Other | Attending: Internal Medicine | Admitting: Internal Medicine

## 2019-11-13 ENCOUNTER — Other Ambulatory Visit: Payer: Self-pay

## 2019-11-13 ENCOUNTER — Encounter (HOSPITAL_COMMUNITY)
Admission: RE | Disposition: A | Discharge status: Home / Self Care | Payer: Self-pay | Source: Home / Self Care | Attending: Internal Medicine

## 2019-11-13 DIAGNOSIS — F329 Major depressive disorder, single episode, unspecified: Secondary | ICD-10-CM | POA: Diagnosis not present

## 2019-11-13 DIAGNOSIS — Z6835 Body mass index (BMI) 35.0-35.9, adult: Secondary | ICD-10-CM | POA: Insufficient documentation

## 2019-11-13 DIAGNOSIS — R001 Bradycardia, unspecified: Secondary | ICD-10-CM | POA: Insufficient documentation

## 2019-11-13 DIAGNOSIS — Z87891 Personal history of nicotine dependence: Secondary | ICD-10-CM | POA: Diagnosis not present

## 2019-11-13 DIAGNOSIS — K219 Gastro-esophageal reflux disease without esophagitis: Secondary | ICD-10-CM | POA: Insufficient documentation

## 2019-11-13 DIAGNOSIS — Z79899 Other long term (current) drug therapy: Secondary | ICD-10-CM | POA: Diagnosis not present

## 2019-11-13 DIAGNOSIS — Z7901 Long term (current) use of anticoagulants: Secondary | ICD-10-CM | POA: Insufficient documentation

## 2019-11-13 DIAGNOSIS — I2510 Atherosclerotic heart disease of native coronary artery without angina pectoris: Secondary | ICD-10-CM | POA: Insufficient documentation

## 2019-11-13 DIAGNOSIS — I4811 Longstanding persistent atrial fibrillation: Secondary | ICD-10-CM | POA: Insufficient documentation

## 2019-11-13 DIAGNOSIS — E669 Obesity, unspecified: Secondary | ICD-10-CM | POA: Insufficient documentation

## 2019-11-13 DIAGNOSIS — Z539 Procedure and treatment not carried out, unspecified reason: Secondary | ICD-10-CM | POA: Insufficient documentation

## 2019-11-13 DIAGNOSIS — I10 Essential (primary) hypertension: Secondary | ICD-10-CM | POA: Diagnosis not present

## 2019-11-13 DIAGNOSIS — K589 Irritable bowel syndrome without diarrhea: Secondary | ICD-10-CM | POA: Diagnosis not present

## 2019-11-13 SURGERY — PACEMAKER LEADLESS INSERTION
Anesthesia: Monitor Anesthesia Care

## 2019-11-13 MED ORDER — SODIUM CHLORIDE 0.9 % IV SOLN: 80.0000 mg | INTRAVENOUS | Status: DC

## 2019-11-13 MED ORDER — CEFAZOLIN SODIUM-DEXTROSE 2-4 GM/100ML-% IV SOLN
INTRAVENOUS | Status: AC
  Filled 2019-11-13: qty 100

## 2019-11-13 MED ORDER — CEFAZOLIN SODIUM-DEXTROSE 2-4 GM/100ML-% IV SOLN: 2.0000 g | INTRAVENOUS | Status: DC

## 2019-11-13 MED ORDER — DEXTROSE 5 % IV SOLN
3.0000 g | INTRAVENOUS | Status: DC
  Filled 2019-11-13: qty 3000

## 2019-11-13 MED ORDER — SODIUM CHLORIDE 0.9 % IV SOLN: INTRAVENOUS | Status: DC

## 2019-11-13 NOTE — Anesthesia Preprocedure Evaluation (Addendum)
Anesthesia Evaluation  Patient identified by MRN, date of birth, ID band Patient awake    Reviewed: Allergy & Precautions, NPO status , Patient's Chart, lab work & pertinent test results  History of Anesthesia Complications Negative for: history of anesthetic complications  Airway Mallampati: II  TM Distance: >3 FB Neck ROM: Full    Dental  (+) Upper Dentures, Edentulous Upper, Edentulous Lower   Pulmonary former smoker,  11/09/2019 SARS Coronavirus NEG   breath sounds clear to auscultation       Cardiovascular hypertension, Pt. on medications (-) angina+ dysrhythmias (s/p ablation) Atrial Fibrillation  Rhythm:Irregular Rate:Normal  '20 ECHO: EF 65-70%, mild MR   Neuro/Psych Depression negative neurological ROS     GI/Hepatic Neg liver ROS, GERD  Controlled,  Endo/Other  Morbid obesity  Renal/GU Renal InsufficiencyRenal disease (creat 1.62)     Musculoskeletal  (+) Arthritis ,   Abdominal (+) + obese,   Peds  Hematology  (+) Blood dyscrasia (eliquis), ,   Anesthesia Other Findings   Reproductive/Obstetrics                            Anesthesia Physical Anesthesia Plan  ASA: III  Anesthesia Plan: MAC   Post-op Pain Management:    Induction:   PONV Risk Score and Plan: 1 and Treatment may vary due to age or medical condition  Airway Management Planned: Natural Airway and Simple Face Mask  Additional Equipment: None  Intra-op Plan:   Post-operative Plan:   Informed Consent: I have reviewed the patients History and Physical, chart, labs and discussed the procedure including the risks, benefits and alternatives for the proposed anesthesia with the patient or authorized representative who has indicated his/her understanding and acceptance.       Plan Discussed with: CRNA and Surgeon  Anesthesia Plan Comments:         Anesthesia Quick Evaluation

## 2019-11-13 NOTE — H&P (Signed)
Chief Complaint:  palpitations  History of Present Illness:    [name] [name]. is a 79 y.o. male who presents for leadless pacemaker implantation today.  Since last being seen in our clinic, the patient reports doing reasonably well.  He continues to have fatigue.  Today, he denies symptoms of palpitations, chest pain,  lower extremity edema, dizziness, presyncope, or syncope.  The patient is otherwise without complaint today.       Past Medical History:  Diagnosis Date  . Atrial flutter [facility])    s/p CTI ablation by Dr Lovena Le 2013  . Calculus of kidney   . Coronary atherosclerosis of unspecified type of vessel, native or graft   . Degeneration of cervical intervertebral disc   . Depression   . DJD (degenerative joint disease)   . GERD (gastroesophageal reflux disease)   . HTN (hypertension)   . Impotence of organic origin   . Inguinal hernia without mention of obstruction or gangrene, unilateral or unspecified, (not specified as recurrent)   . Insomnia, unspecified   . Intestinal infection due to Clostridium difficile   . Irritable bowel syndrome   . Kidney stone    "they just passed" (05/03/2012)  . Obesity, unspecified   . Partial deafness   . Sacroiliitis, not elsewhere classified ([place])   . Unspecified hereditary and idiopathic peripheral neuropathy          Past Surgical History:  Procedure Laterality Date  . APPENDECTOMY  ~ 1956  . ATRIAL FLUTTER ABLATION N/A 05/03/2012   CTI ablation by Dr Lovena Le in 2013  . CARDIAC CATHETERIZATION  08/1999; 05/03/2012   normal coronary arteries;   . CARPAL TUNNEL WITH CUBITAL TUNNEL  2004   "right" (05/03/2012)  . COLONOSCOPY  06/06/2008   severe sigmoid diverticulosis and internal hemorrhoids  . ESOPHAGOGASTRODUODENOSCOPY  06/06/2008   normal  . INGUINAL HERNIA REPAIR  03/23/11   "left" (05/03/2012)  . POSTERIOR LUMBAR FUSION  1989  . reconstruction (r) foot surgery     DR SUE   . RENAL CYST  EXCISION     pt denies this hx on 05/03/2012  . [facility]  . SPINE SURGERY  1969   Pantopaque Myelography and spinal infusion- Dr. Lyman Speller  . TONSILLECTOMY  ~ 1946  . TOTAL HIP ARTHROPLASTY  1999-2000   bilateral          Current Outpatient Medications  Medication Sig Dispense Refill  . apixaban (ELIQUIS) 5 MG TABS tablet Take 1 tablet (5 mg total) by mouth 2 (two) times daily. 60 tablet 6  . Ascorbic Acid (VITAMIN C) 1000 MG tablet Take 1,000 mg by mouth daily.    . finasteride (PROSCAR) 5 MG tablet Take 5 mg by mouth daily.    . fluocinonide cream (LIDEX) 0.05 % APPLY TO AFFECTED AREA 3 TIMES A DAY AS NEEDED    . gabapentin (NEURONTIN) 100 MG capsule Take 100 mg by mouth 4 (four) times daily.     Marland Kitchen losartan-hydrochlorothiazide (HYZAAR) 100-25 MG tablet Take 1 tablet by mouth daily.    . ondansetron (ZOFRAN) 4 MG tablet Take 4 mg by mouth every 8 (eight) hours as needed.    Marland Kitchen oxyCODONE (OXY IR/ROXICODONE) 5 MG immediate release tablet TAKE 1 2 TABS BY MOUTH UP TO FOUR TIMES A DAY AS NEEDED FOR SEVERE PAIN DX  G89.29 M25.511, M54.9    . triamcinolone cream (KENALOG) 0.1 % APPLY TO SKIN 2 TO 3 TIMES A DAY AS NEEDED  FOR 2 WEEKS    . furosemide (LASIX) 20 MG tablet Take 20 mg by mouth daily.     No current facility-administered medications for this visit.    Allergies:   Amitriptyline, Azithromycin, and Flexeril [cyclobenzaprine]   Social History:  The patient  reports that he has quit smoking. His smoking use included cigarettes. He has a 10.00 pack-year smoking history. He has never used smokeless tobacco. He reports current alcohol use. He reports that he does not use drugs.   Family History:  The patient's family history includes Hypertension in his father.   ROS:  Please see the history of present illness.   All other systems are personally reviewed and negative.    Physical Exam: Vitals:   11/13/19 0808  Pulse: 87  Resp: 18   Temp: (!) 97.2 F (36.2 C)  TempSrc: Temporal  SpO2: 100%  Weight: 127 kg  Height: 6\' 3"  (1.905 m)    GEN- The patient is well appearing, alert and oriented x 3 today.   Head- normocephalic, atraumatic Eyes-  Sclera clear, conjunctiva pink Ears- hearing is poor Oropharynx- clear Neck- supple, Lungs-   normal work of breathing Heart- irregular rate and rhythm  GI- soft, NT, ND, + BS Extremities- no clubbing, cyanosis, or edema, groin is without hematoma/ bruit MS- no significant deformity or atrophy Skin- no rash or lesion Psych- euthymic mood, full affect Neuro- strength and sensation are intact   Labs/Other Tests and Data Reviewed:    Recent Labs: 02/22/2019: BUN 25; Creatinine, Ser 1.56; Potassium 4.9; Sodium 140      Wt Readings from Last 3 Encounters:  09/19/19 263 lb (119.3 kg)  05/14/19 279 lb (126.6 kg)  04/09/19 277 lb (125.6 kg)      ASSESSMENT & PLAN:    1.  Symptomatic bradycardia The patient has symptomatic bradycardia.  He appears to have longstanding persistent afib.   I would therefore recommend pacemaker implantation at this time.  We discussed pros and cons to both leadless and transvenous pacing.  He would like to proceed with leadless pacing according to clinical trial.  Risks, benefits, alternatives to pacemaker implantation (both leadless and transvenous) were discussed in detail with the patient today. The patient understands that the risks include but are not limited to bleeding, infection, pneumothorax, perforation, tamponade, vascular damage, renal failure, MI, stroke, death,  and lead dislodgement and wishes to proceed.   He last took eliquis yesterday am.  Thompson Grayer MD, [place] 11/13/2019 10:17 AM

## 2019-11-13 NOTE — H&P (Signed)
The patient presents for leadless pacemaker implantation.  Unfortunately, he was not aware that he would require recovery from his procedure and would not be able to care for his wife who is dependant on his care.  He also states that he is not prepared to stay overnight.  I have spoken with the patients's step son who confirms that the patients wife is dependant on him and there are no other family members at this time that can provide support.  At the patient's request, we will therefore cancel is procedure.  I did offer at length for he and his step-son to make additional arrangements for the patients wife so that we could proceed with surgery today.  I informed him that with groin access and also with sedation that he would under no circumstances be able to provide care for her this evening.  Thompson Grayer MD, [facility] 11/13/2019 10:54 AM

## 2019-11-13 NOTE — Research (Signed)
[place] Informed Consent   Subject Name: [name] [name].  Subject met inclusion and exclusion criteria.  The informed consent form, study requirements and expectations were reviewed with the subject and questions and concerns were addressed prior to the signing of the consent form.  The subject verbalized understanding of the trail requirements.  The subject agreed to participate in the LEADLESS II-AVEIR PACEMAKER trial and signed the informed consent.  The informed consent was obtained prior to performance of any protocol-specific procedures for the subject.  A copy of the signed informed consent was given to the subject and a copy was placed in the subject's medical record.  [name]. 11/13/2019, 08:10 AM

## 2019-11-14 NOTE — Telephone Encounter (Signed)
Spoke to the patient and apologized for the misunderstanding regarding his procedure. He was very appreciative of my call and knows Sonia Baller will be in touch with him this afternoon to coordinate a new time.

## 2019-11-15 NOTE — Telephone Encounter (Signed)
Call placed to Pt regarding rescheduling procedure.  July 1 and 6 are possibilities, advised for him to check with his son.  Also gave all dates available in August.  Pt requests this nurse call back in a couple of days.  Will plan to call Pt Tuesday of next week to see what day will work.

## 2019-11-20 NOTE — Telephone Encounter (Signed)
Outreach made to Pt.  He is trying to get a date/time set up with son in law to care for wife.  Pt asked nurse to call him back on Thursday.

## 2019-11-26 NOTE — Telephone Encounter (Signed)
LMTCB  PLEASE MESSAGE NURSE WHEN PT CALLS BACK

## 2019-11-27 ENCOUNTER — Ambulatory Visit: Payer: Medicare Other

## 2019-12-06 ENCOUNTER — Encounter: Payer: Self-pay | Admitting: *Deleted

## 2019-12-06 ENCOUNTER — Other Ambulatory Visit (HOSPITAL_COMMUNITY): Payer: Medicare Other

## 2019-12-06 ENCOUNTER — Ambulatory Visit: Payer: Medicare Other | Admitting: Podiatry

## 2019-12-06 ENCOUNTER — Telehealth: Payer: Self-pay | Admitting: Licensed Clinical Social Worker

## 2019-12-06 NOTE — Telephone Encounter (Signed)
CSW received referral to assist patient with transportation home on 01-16-2020 after his pacemaker insertion. CSW contacted patient to inform that CSW will assist with transport pending procedure and discharge. Patient grateful for the support. Raquel Sarna, [place], [place]

## 2019-12-06 NOTE — Telephone Encounter (Signed)
Called placed to patient for reschedule on January 15, 2020 at 10:30am.   Labs and Covid test January 11, 2020  Work up completed

## 2019-12-31 ENCOUNTER — Telehealth: Payer: Self-pay | Admitting: Internal Medicine

## 2019-12-31 NOTE — Telephone Encounter (Signed)
Patient has some questions about his upcoming procedure. He ask that Sonia Baller call him at her earliest convenience

## 2020-01-01 ENCOUNTER — Encounter: Payer: Self-pay | Admitting: *Deleted

## 2020-01-01 NOTE — Telephone Encounter (Signed)
Called the patient back and answered all of his questions to satisfaction. Reminded him of his up coming appointment for pre procedure labs and that the nurse would go over his instruction letter after labs.

## 2020-01-02 ENCOUNTER — Encounter: Payer: Self-pay | Admitting: Internal Medicine

## 2020-01-02 NOTE — Telephone Encounter (Signed)
Patient calling back with more questions. 

## 2020-01-02 NOTE — Telephone Encounter (Signed)
error 

## 2020-01-03 ENCOUNTER — Telehealth: Payer: Self-pay | Admitting: Internal Medicine

## 2020-01-03 NOTE — Telephone Encounter (Signed)
Spoke with the patient and answered all of his questions about up coming procedure.

## 2020-01-03 NOTE — Telephone Encounter (Signed)
  Patient would like to know once he has his pacemaker placed, how will that affect him pushing up out of a chair with both arms? He has to use both to get up from a sitting position.

## 2020-01-08 ENCOUNTER — Telehealth: Payer: Self-pay | Admitting: Internal Medicine

## 2020-01-08 NOTE — Telephone Encounter (Signed)
[name] [name] is calling requesting to speak with [name] [name] in regards to his upcoming appointment. Please advise.

## 2020-01-09 NOTE — Telephone Encounter (Signed)
Left voice mail to call back 

## 2020-01-11 ENCOUNTER — Other Ambulatory Visit: Payer: Medicare Other | Admitting: *Deleted

## 2020-01-11 ENCOUNTER — Other Ambulatory Visit (HOSPITAL_COMMUNITY)
Admission: RE | Admit: 2020-01-11 | Discharge: 2020-01-11 | Disposition: A | Discharge status: Home / Self Care | Payer: Medicare Other | Source: Ambulatory Visit | Attending: Internal Medicine | Admitting: Internal Medicine

## 2020-01-11 ENCOUNTER — Other Ambulatory Visit: Payer: Self-pay

## 2020-01-11 DIAGNOSIS — Z01812 Encounter for preprocedural laboratory examination: Secondary | ICD-10-CM | POA: Diagnosis present

## 2020-01-11 DIAGNOSIS — Z20822 Contact with and (suspected) exposure to covid-19: Secondary | ICD-10-CM | POA: Diagnosis not present

## 2020-01-11 DIAGNOSIS — R001 Bradycardia, unspecified: Secondary | ICD-10-CM

## 2020-01-11 LAB — CBC WITH DIFFERENTIAL/PLATELET
Basophils Absolute: 0.1 10*3/uL (ref 0.0–0.2)
Basos: 1 %
EOS (ABSOLUTE): 0.3 10*3/uL (ref 0.0–0.4)
Eos: 6 %
Hematocrit: 35.8 % — ABNORMAL LOW (ref 37.5–51.0)
Hemoglobin: 12 g/dL — ABNORMAL LOW (ref 13.0–17.7)
Immature Grans (Abs): 0 10*3/uL (ref 0.0–0.1)
Immature Granulocytes: 0 %
Lymphocytes Absolute: 1.2 10*3/uL (ref 0.7–3.1)
Lymphs: 23 %
MCH: 29.8 pg (ref 26.6–33.0)
MCHC: 33.5 g/dL (ref 31.5–35.7)
MCV: 89 fL (ref 79–97)
Monocytes Absolute: 0.7 10*3/uL (ref 0.1–0.9)
Monocytes: 14 %
Neutrophils Absolute: 2.9 10*3/uL (ref 1.4–7.0)
Neutrophils: 56 %
Platelets: 182 10*3/uL (ref 150–450)
RBC: 4.03 x10E6/uL — ABNORMAL LOW (ref 4.14–5.80)
RDW: 13.8 % (ref 11.6–15.4)
WBC: 5.1 10*3/uL (ref 3.4–10.8)

## 2020-01-11 LAB — BASIC METABOLIC PANEL
BUN/Creatinine Ratio: 16 (ref 10–24)
BUN: 22 mg/dL (ref 8–27)
CO2: 24 mmol/L (ref 20–29)
Calcium: 9.2 mg/dL (ref 8.6–10.2)
Chloride: 105 mmol/L (ref 96–106)
Creatinine, Ser: 1.4 mg/dL — ABNORMAL HIGH (ref 0.76–1.27)
GFR calc Af Amer: 55 mL/min/{1.73_m2} — ABNORMAL LOW (ref >59)
GFR calc non Af Amer: 47 mL/min/{1.73_m2} — ABNORMAL LOW (ref >59)
Glucose: 91 mg/dL (ref 65–99)
Potassium: 3.8 mmol/L (ref 3.5–5.2)
Sodium: 142 mmol/L (ref 134–144)

## 2020-01-11 LAB — SARS CORONAVIRUS 2 (TAT 6-24 HRS): SARS Coronavirus 2: NEGATIVE

## 2020-01-14 NOTE — Progress Notes (Signed)
Instructed patient on the following items: Arrival time 1030 Nothing to eat or drink after midnight No meds AM of procedure  Patient will spend the night post procedure, SW Raquel Sarna will arrange transportation, see note from 12-06-2019.  Wash with special soap night before and morning of procedure If on anti-coagulant drug instructions Eliquis last dose this am

## 2020-01-15 ENCOUNTER — Ambulatory Visit (HOSPITAL_COMMUNITY): Payer: Medicare Other

## 2020-01-15 ENCOUNTER — Ambulatory Visit (HOSPITAL_COMMUNITY)
Admission: RE | Admit: 2020-01-15 | Discharge: 2020-01-15 | Disposition: A | Discharge status: Home / Self Care | Payer: Medicare Other | Attending: Internal Medicine | Admitting: Internal Medicine

## 2020-01-15 ENCOUNTER — Encounter (HOSPITAL_COMMUNITY)
Admission: RE | Disposition: A | Discharge status: Home / Self Care | Payer: Medicare Other | Source: Home / Self Care | Attending: Internal Medicine

## 2020-01-15 ENCOUNTER — Encounter (HOSPITAL_COMMUNITY): Payer: Self-pay | Admitting: Internal Medicine

## 2020-01-15 ENCOUNTER — Ambulatory Visit (HOSPITAL_COMMUNITY): Payer: Medicare Other | Admitting: Certified Registered Nurse Anesthetist

## 2020-01-15 DIAGNOSIS — K219 Gastro-esophageal reflux disease without esophagitis: Secondary | ICD-10-CM | POA: Diagnosis not present

## 2020-01-15 DIAGNOSIS — K589 Irritable bowel syndrome without diarrhea: Secondary | ICD-10-CM | POA: Diagnosis not present

## 2020-01-15 DIAGNOSIS — Z950 Presence of cardiac pacemaker: Secondary | ICD-10-CM

## 2020-01-15 DIAGNOSIS — G609 Hereditary and idiopathic neuropathy, unspecified: Secondary | ICD-10-CM | POA: Diagnosis not present

## 2020-01-15 DIAGNOSIS — I441 Atrioventricular block, second degree: Secondary | ICD-10-CM | POA: Diagnosis not present

## 2020-01-15 DIAGNOSIS — I4811 Longstanding persistent atrial fibrillation: Secondary | ICD-10-CM | POA: Insufficient documentation

## 2020-01-15 DIAGNOSIS — I10 Essential (primary) hypertension: Secondary | ICD-10-CM | POA: Insufficient documentation

## 2020-01-15 DIAGNOSIS — Z87891 Personal history of nicotine dependence: Secondary | ICD-10-CM | POA: Insufficient documentation

## 2020-01-15 DIAGNOSIS — Z79899 Other long term (current) drug therapy: Secondary | ICD-10-CM | POA: Insufficient documentation

## 2020-01-15 DIAGNOSIS — Z881 Allergy status to other antibiotic agents status: Secondary | ICD-10-CM | POA: Diagnosis not present

## 2020-01-15 DIAGNOSIS — E669 Obesity, unspecified: Secondary | ICD-10-CM | POA: Diagnosis not present

## 2020-01-15 DIAGNOSIS — Z006 Encounter for examination for normal comparison and control in clinical research program: Secondary | ICD-10-CM | POA: Insufficient documentation

## 2020-01-15 DIAGNOSIS — Z7901 Long term (current) use of anticoagulants: Secondary | ICD-10-CM | POA: Diagnosis not present

## 2020-01-15 DIAGNOSIS — M1990 Unspecified osteoarthritis, unspecified site: Secondary | ICD-10-CM | POA: Diagnosis not present

## 2020-01-15 DIAGNOSIS — I2510 Atherosclerotic heart disease of native coronary artery without angina pectoris: Secondary | ICD-10-CM | POA: Diagnosis not present

## 2020-01-15 DIAGNOSIS — Z6836 Body mass index (BMI) 36.0-36.9, adult: Secondary | ICD-10-CM | POA: Diagnosis not present

## 2020-01-15 DIAGNOSIS — Z8249 Family history of ischemic heart disease and other diseases of the circulatory system: Secondary | ICD-10-CM | POA: Insufficient documentation

## 2020-01-15 DIAGNOSIS — G4700 Insomnia, unspecified: Secondary | ICD-10-CM | POA: Diagnosis not present

## 2020-01-15 DIAGNOSIS — Z888 Allergy status to other drugs, medicaments and biological substances status: Secondary | ICD-10-CM | POA: Diagnosis not present

## 2020-01-15 DIAGNOSIS — Z96643 Presence of artificial hip joint, bilateral: Secondary | ICD-10-CM | POA: Diagnosis not present

## 2020-01-15 DIAGNOSIS — I4819 Other persistent atrial fibrillation: Secondary | ICD-10-CM

## 2020-01-15 HISTORY — PX: PACEMAKER LEADLESS INSERTION: EP1219

## 2020-01-15 SURGERY — PACEMAKER LEADLESS INSERTION
Anesthesia: Monitor Anesthesia Care

## 2020-01-15 MED ORDER — HEPARIN (PORCINE) IN NACL 1000-0.9 UT/500ML-% IV SOLN
INTRAVENOUS | Status: DC | PRN
  Administered 2020-01-15 (×3): 500 mL

## 2020-01-15 MED ORDER — PROPOFOL 500 MG/50ML IV EMUL
INTRAVENOUS | Status: DC | PRN
  Administered 2020-01-15: 50 ug/kg/min via INTRAVENOUS

## 2020-01-15 MED ORDER — SODIUM CHLORIDE 0.9 % IV SOLN: 250.0000 mL | INTRAVENOUS | Status: DC | PRN

## 2020-01-15 MED ORDER — SODIUM CHLORIDE 0.9% FLUSH: 3.0000 mL | INTRAVENOUS | Status: DC | PRN

## 2020-01-15 MED ORDER — SODIUM CHLORIDE 0.9 % IV SOLN: INTRAVENOUS | Status: DC

## 2020-01-15 MED ORDER — PROPOFOL 10 MG/ML IV BOLUS
INTRAVENOUS | Status: DC | PRN
Start: 2020-01-15 — End: 2020-01-15
  Administered 2020-01-15: 170 mg via INTRAVENOUS
  Administered 2020-01-15: 10 mg via INTRAVENOUS
  Administered 2020-01-15: 20 mg via INTRAVENOUS

## 2020-01-15 MED ORDER — ACETAMINOPHEN 325 MG PO TABS: 650.0000 mg | ORAL_TABLET | ORAL | Status: DC | PRN

## 2020-01-15 MED ORDER — BUPIVACAINE HCL (PF) 0.25 % IJ SOLN
INTRAMUSCULAR | Status: AC
  Filled 2020-01-15: qty 30

## 2020-01-15 MED ORDER — HEPARIN (PORCINE) IN NACL 1000-0.9 UT/500ML-% IV SOLN
INTRAVENOUS | Status: DC | PRN
  Administered 2020-01-15: 500 mL

## 2020-01-15 MED ORDER — DEXMEDETOMIDINE HCL IN NACL 200 MCG/50ML IV SOLN
INTRAVENOUS | Status: DC | PRN
  Administered 2020-01-15: .7 ug/kg/h via INTRAVENOUS

## 2020-01-15 MED ORDER — HEPARIN (PORCINE) IN NACL 1000-0.9 UT/500ML-% IV SOLN
INTRAVENOUS | Status: AC
  Filled 2020-01-15: qty 500

## 2020-01-15 MED ORDER — BUPIVACAINE HCL (PF) 0.25 % IJ SOLN
INTRAMUSCULAR | Status: DC | PRN
  Administered 2020-01-15: 30 mL

## 2020-01-15 MED ORDER — HEPARIN SODIUM (PORCINE) 1000 UNIT/ML IJ SOLN
INTRAMUSCULAR | Status: DC | PRN
Start: 2020-01-15 — End: 2020-01-15
  Administered 2020-01-15: 2500 [IU] via INTRAVENOUS

## 2020-01-15 MED ORDER — ONDANSETRON HCL 4 MG/2ML IJ SOLN
INTRAMUSCULAR | Status: DC | PRN
Start: 2020-01-15 — End: 2020-01-15
  Administered 2020-01-15: 4 mg via INTRAVENOUS

## 2020-01-15 MED ORDER — ONDANSETRON HCL 4 MG/2ML IJ SOLN: 4.0000 mg | Freq: Four times a day (QID) | INTRAMUSCULAR | Status: DC | PRN

## 2020-01-15 MED ORDER — SUGAMMADEX SODIUM 200 MG/2ML IV SOLN
INTRAVENOUS | Status: DC | PRN
  Administered 2020-01-15: 200 mg via INTRAVENOUS

## 2020-01-15 MED ORDER — SODIUM CHLORIDE 0.9% FLUSH: 3.0000 mL | Freq: Two times a day (BID) | INTRAVENOUS | Status: DC

## 2020-01-15 MED ORDER — ROCURONIUM BROMIDE 100 MG/10ML IV SOLN
INTRAVENOUS | Status: DC | PRN
Start: 2020-01-15 — End: 2020-01-15
  Administered 2020-01-15: 40 mg via INTRAVENOUS

## 2020-01-15 MED ORDER — MIDAZOLAM HCL 5 MG/5ML IJ SOLN
INTRAMUSCULAR | Status: DC | PRN
Start: 2020-01-15 — End: 2020-01-15
  Administered 2020-01-15: 1 mg via INTRAVENOUS

## 2020-01-15 MED ORDER — DEXMEDETOMIDINE HCL IN NACL 200 MCG/50ML IV SOLN
INTRAVENOUS | Status: DC | PRN
Start: 2020-01-15 — End: 2020-01-15
  Administered 2020-01-15: 63.52 ug via INTRAVENOUS

## 2020-01-15 MED ORDER — FENTANYL CITRATE (PF) 100 MCG/2ML IJ SOLN
INTRAMUSCULAR | Status: DC | PRN
  Administered 2020-01-15: 25 ug via INTRAVENOUS

## 2020-01-15 MED ORDER — DEXTROSE 5 % IV SOLN
3.0000 g | INTRAVENOUS | Status: AC
  Administered 2020-01-15: 3 g via INTRAVENOUS
  Filled 2020-01-15 (×2): qty 3000

## 2020-01-15 SURGICAL SUPPLY — 16 items
BAG SNAP BAND KOVER 36X36 (MISCELLANEOUS) ×2 IMPLANT
CABLE SURGICAL S-101-97-12 (CABLE) ×3 IMPLANT
DEVICE CLOSURE PERCLS PRGLD 6F (VASCULAR PRODUCTS) IMPLANT
KIT DILATOR VASC 18G NDL (KITS) ×2 IMPLANT
MICRA AV TRANSCATH PACING SYS (Pacemaker) ×3 IMPLANT
MICRA INTRODUCER SHEATH (SHEATH) ×3
PAD PRO RADIOLUCENT 2001M-C (PAD) ×3 IMPLANT
PERCLOSE PROGLIDE 6F (VASCULAR PRODUCTS) ×6
SHEATH DILAT COONS TAPER 22F (SHEATH) ×2 IMPLANT
SHEATH INTRODUCER MICRA (SHEATH) IMPLANT
SHEATH PINNACLE 8F 10CM (SHEATH) ×2 IMPLANT
SHEATH PROBE COVER 6X72 (BAG) ×2 IMPLANT
SYSTEM PACING TRNSCTH AV MICRA (Pacemaker) IMPLANT
TRAY PACEMAKER INSERTION (PACKS) ×3 IMPLANT
WIRE AMPLATZ SS-J .035X180CM (WIRE) ×2 IMPLANT
WIRE HI TORQ VERSACORE-J 145CM (WIRE) ×2 IMPLANT

## 2020-01-15 NOTE — Anesthesia Procedure Notes (Signed)
Procedure Name: Intubation Date/Time: 01/15/2020 2:31 PM Performed by: Janace Litten, CRNA Pre-anesthesia Checklist: Patient identified, Emergency Drugs available, Suction available and Patient being monitored Patient Re-evaluated:Patient Re-evaluated prior to induction Oxygen Delivery Method: Circle System Utilized Preoxygenation: Pre-oxygenation with 100% oxygen Induction Type: IV induction Ventilation: Mask ventilation without difficulty Laryngoscope Size: Mac and 4 Grade View: Grade I Tube type: Oral Tube size: 7.0 mm Number of attempts: 1 Airway Equipment and Method: Stylet and Oral airway Placement Confirmation: ETT inserted through vocal cords under direct vision,  positive ETCO2 and breath sounds checked- equal and bilateral Secured at: 21 cm Tube secured with: Tape Dental Injury: Teeth and Oropharynx as per pre-operative assessment

## 2020-01-15 NOTE — Anesthesia Preprocedure Evaluation (Addendum)
Anesthesia Evaluation  Patient identified by MRN, date of birth, ID band Patient awake    Reviewed: Allergy & Precautions, NPO status , Patient's Chart, lab work & pertinent test results  History of Anesthesia Complications Negative for: history of anesthetic complications  Airway Mallampati: II  TM Distance: >3 FB Neck ROM: Full    Dental  (+) Dental Advisory Given   Pulmonary former smoker,    Pulmonary exam normal        Cardiovascular hypertension, Pt. on medications + CAD  Normal cardiovascular exam+ dysrhythmias Atrial Fibrillation    '20 TTE - EF 65 to 70%. Severely increased LVH. Left atrial size was moderately dilated. Mild MR. Upper normal size of the ascending aorta measuring 40 mm.     Neuro/Psych PSYCHIATRIC DISORDERS Depression  Partial deafness     GI/Hepatic Neg liver ROS, GERD  Controlled, IBS    Endo/Other   Obesity   Renal/GU CRFRenal disease     Musculoskeletal  (+) Arthritis ,   Abdominal   Peds  Hematology  (+) anemia ,  On eliquis    Anesthesia Other Findings Covid test negative   Reproductive/Obstetrics                            Anesthesia Physical Anesthesia Plan  ASA: III  Anesthesia Plan: MAC   Post-op Pain Management:    Induction: Intravenous  PONV Risk Score and Plan: 1 and Propofol infusion and Treatment may vary due to age or medical condition  Airway Management Planned: Natural Airway and Simple Face Mask  Additional Equipment: None  Intra-op Plan:   Post-operative Plan:   Informed Consent: I have reviewed the patients History and Physical, chart, labs and discussed the procedure including the risks, benefits and alternatives for the proposed anesthesia with the patient or authorized representative who has indicated his/her understanding and acceptance.       Plan Discussed with: CRNA and Anesthesiologist  Anesthesia Plan  Comments:        Anesthesia Quick Evaluation

## 2020-01-15 NOTE — H&P (Signed)
Chief Complaint:  palpitations  History of Present Illness:    [name] Jumper. is a 79 y.o. male who presents via for pacemaker implantation.  Since last being seen in our clinic, the patient reports doing reasonably well. He is frequently SOB and has reduced exercise tolerance.   Today, he denies symptoms of palpitations, chest pain,  lower extremity edema, dizziness, presyncope, or syncope.  The patient is otherwise without complaint today.       Past Medical History:  Diagnosis Date  . Atrial flutter [facility])    s/p CTI ablation by Dr Lovena Le 2013  . Calculus of kidney   . Coronary atherosclerosis of unspecified type of vessel, native or graft   . Degeneration of cervical intervertebral disc   . Depression   . DJD (degenerative joint disease)   . GERD (gastroesophageal reflux disease)   . HTN (hypertension)   . Impotence of organic origin   . Inguinal hernia without mention of obstruction or gangrene, unilateral or unspecified, (not specified as recurrent)   . Insomnia, unspecified   . Intestinal infection due to Clostridium difficile   . Irritable bowel syndrome   . Kidney stone    "they just passed" (05/03/2012)  . Obesity, unspecified   . Partial deafness   . Sacroiliitis, not elsewhere classified ([place])   . Unspecified hereditary and idiopathic peripheral neuropathy          Past Surgical History:  Procedure Laterality Date  . APPENDECTOMY  ~ 1956  . ATRIAL FLUTTER ABLATION N/A 05/03/2012   CTI ablation by Dr Lovena Le in 2013  . CARDIAC CATHETERIZATION  08/1999; 05/03/2012   normal coronary arteries;   . CARPAL TUNNEL WITH CUBITAL TUNNEL  2004   "right" (05/03/2012)  . COLONOSCOPY  06/06/2008   severe sigmoid diverticulosis and internal hemorrhoids  . ESOPHAGOGASTRODUODENOSCOPY  06/06/2008   normal  . INGUINAL HERNIA REPAIR  03/23/11   "left" (05/03/2012)  . POSTERIOR LUMBAR FUSION  1989  . reconstruction (r) foot surgery     DR SUE    . RENAL CYST EXCISION     pt denies this hx on 05/03/2012  . [place]  . SPINE SURGERY  1969   Pantopaque Myelography and spinal infusion- Dr. Lyman Speller  . TONSILLECTOMY  ~ 1946  . TOTAL HIP ARTHROPLASTY  1999-2000   bilateral          Current Outpatient Medications  Medication Sig Dispense Refill  . apixaban (ELIQUIS) 5 MG TABS tablet Take 1 tablet (5 mg total) by mouth 2 (two) times daily. 60 tablet 6  . Ascorbic Acid (VITAMIN C) 1000 MG tablet Take 1,000 mg by mouth daily.    . finasteride (PROSCAR) 5 MG tablet Take 5 mg by mouth daily.    . fluocinonide cream (LIDEX) 0.05 % APPLY TO AFFECTED AREA 3 TIMES A DAY AS NEEDED    . gabapentin (NEURONTIN) 100 MG capsule Take 100 mg by mouth 4 (four) times daily.     Marland Kitchen losartan-hydrochlorothiazide (HYZAAR) 100-25 MG tablet Take 1 tablet by mouth daily.    . ondansetron (ZOFRAN) 4 MG tablet Take 4 mg by mouth every 8 (eight) hours as needed.    Marland Kitchen oxyCODONE (OXY IR/ROXICODONE) 5 MG immediate release tablet TAKE 1 2 TABS BY MOUTH UP TO FOUR TIMES A DAY AS NEEDED FOR SEVERE PAIN DX  G89.29 M25.511, M54.9    . triamcinolone cream (KENALOG) 0.1 % APPLY TO SKIN 2 TO 3 TIMES A  DAY AS NEEDED FOR 2 WEEKS    . furosemide (LASIX) 20 MG tablet Take 20 mg by mouth daily.     No current facility-administered medications for this visit.    Allergies:   Amitriptyline, Azithromycin, and Flexeril [cyclobenzaprine]   Social History:  The patient  reports that he has quit smoking. His smoking use included cigarettes. He has a 10.00 pack-year smoking history. He has never used smokeless tobacco. He reports current alcohol use. He reports that he does not use drugs.   Family History:  The patient's family history includes Hypertension in his father.   ROS:  Please see the history of present illness.   All other systems are personally reviewed and negative.   Physical Exam: Vitals:   01/15/20 1119  BP: (!)  192/73  Pulse: 64  Resp: 17  Temp: (!) 97.5 F (36.4 C)  TempSrc: Oral  SpO2: 93%  Weight: 127 kg  Height: 6\' 2"  (1.88 m)    GEN- The patient is eldelry appearing, alert and oriented x 3 today.   Head- normocephalic, atraumatic Eyes-  Sclera clear, conjunctiva pink Ears- hearing intact Oropharynx- clear Neck- supple, Lungs-   normal work of breathing Heart- bradycardic irregular rhythm  GI- soft, NT, ND, + BS Extremities- + edema MS- no significant deformity or atrophy Skin- no rash or lesion Psych- euthymic mood, full affect Neuro- strength and sensation are intact   Labs/Other Tests and Data Reviewed:    Recent Labs: 02/22/2019: BUN 25; Creatinine, Ser 1.56; Potassium 4.9; Sodium 140      Wt Readings from Last 3 Encounters:  09/19/19 263 lb (119.3 kg)  05/14/19 279 lb (126.6 kg)  04/09/19 277 lb (125.6 kg)      ASSESSMENT & PLAN:    1.  Symptomatic bradycardia The patient has symptomatic bradycardia.  He appears to have longstanding persistent afib.   I would therefore recommend pacemaker implantation at this time.  We discussed pros and cons to both leadless and transvenous pacing.  He would like to proceed with leadless pacing.  Risks, benefits, alternatives to pacemaker implantation (both traditional and leadless) were discussed in detail with the patient today. The patient understands that the risks include but are not limited to bleeding, infection, pneumothorax, perforation, tamponade, vascular damage, renal failure, MI, stroke, death,  and lead dislodgement and wishes to proceed.    Thompson Grayer MD, [place] 01/15/2020 1:22 PM

## 2020-01-15 NOTE — Discharge Instructions (Signed)
Resume eliquis 01/16/20 am dose   Femoral Site Care This sheet gives you information about how to care for yourself after your procedure. Your health care provider may also give you more specific instructions. If you have problems or questions, contact your health care provider. What can I expect after the procedure? After the procedure, it is common to have:  Bruising that usually fades within 1-2 weeks.  Tenderness at the site. Follow these instructions at home: Wound care  Follow instructions from your health care provider about how to take care of your insertion site. Make sure you: ? Wash your hands with soap and water before you change your bandage (dressing). If soap and water are not available, use hand sanitizer. ? Change your dressing as told by your health care provider. ? Leave stitches (sutures), skin glue, or adhesive strips in place. These skin closures may need to stay in place for 2 weeks or longer. If adhesive strip edges start to loosen and curl up, you may trim the loose edges. Do not remove adhesive strips completely unless your health care provider tells you to do that.  Do not take baths, swim, or use a hot tub until your health care provider approves.  You may shower 24-48 hours after the procedure or as told by your health care provider. ? Gently wash the site with plain soap and water. ? Pat the area dry with a clean towel. ? Do not rub the site. This may cause bleeding.  Do not apply powder or lotion to the site. Keep the site clean and dry.  Check your femoral site every day for signs of infection. Check for: ? Redness, swelling, or pain. ? Fluid or blood. ? Warmth. ? Pus or a bad smell. Activity  For the first 2-3 days after your procedure, or as long as directed: ? Avoid climbing stairs as much as possible. ? Do not squat.  Do not lift anything that is heavier than 10 lb (4.5 kg), or the limit that you are told, until your health care provider says  that it is safe.  Rest as directed. ? Avoid sitting for a long time without moving. Get up to take short walks every 1-2 hours.  Do not drive for 24 hours if you were given a medicine to help you relax (sedative). General instructions  Take over-the-counter and prescription medicines only as told by your health care provider.  Keep all follow-up visits as told by your health care provider. This is important. Contact a health care provider if you have:  A fever or chills.  You have redness, swelling, or pain around your insertion site. Get help right away if:  The catheter insertion area swells very fast.  You pass out.  You suddenly start to sweat or your skin gets clammy.  The catheter insertion area is bleeding, and the bleeding does not stop when you hold steady pressure on the area.  The area near or just beyond the catheter insertion site becomes pale, cool, tingly, or numb. These symptoms may represent a serious problem that is an emergency. Do not wait to see if the symptoms will go away. Get medical help right away. Call your local emergency services (911 in the [place] Do not drive yourself to the hospital. Summary  After the procedure, it is common to have bruising that usually fades within 1-2 weeks.  Check your femoral site every day for signs of infection.  Do not lift anything that is heavier  than 10 lb (4.5 kg), or the limit that you are told, until your health care provider says that it is safe. This information is not intended to replace advice given to you by your health care provider. Make sure you discuss any questions you have with your health care provider. Document Revised: 05/30/2017 Document Reviewed: 05/30/2017 Elsevier Patient Education  2020 Elsevier Inc.  

## 2020-01-15 NOTE — Transfer of Care (Signed)
Immediate Anesthesia Transfer of Care Note  Patient: [name] [name].  Procedure(s) Performed: PACEMAKER LEADLESS INSERTION (N/A )  Patient Location: PACU  Anesthesia Type:General  Level of Consciousness: drowsy, patient cooperative and responds to stimulation  Airway & Oxygen Therapy: Patient Spontanous Breathing and Patient connected to nasal cannula oxygen  Post-op Assessment: Report given to RN and Post -op Vital signs reviewed and stable  Post vital signs: Reviewed and stable  Last Vitals:  Vitals Value Taken Time  BP 126/61 01/15/20 1522  Temp    Pulse 59 01/15/20 1524  Resp 17 01/15/20 1524  SpO2 94 % 01/15/20 1524  Vitals shown include unvalidated device data.  Last Pain:  Vitals:   01/15/20 1133  TempSrc:   PainSc: 0-No pain      Patients Stated Pain Goal: 4 (78/97/84 7841)  Complications: No complications documented.

## 2020-01-15 NOTE — Progress Notes (Signed)
Patient and son was given discharge instructions. Both verbalized understanding. 

## 2020-01-16 ENCOUNTER — Encounter (HOSPITAL_COMMUNITY): Payer: Self-pay | Admitting: Internal Medicine

## 2020-01-16 ENCOUNTER — Encounter: Payer: Self-pay | Admitting: *Deleted

## 2020-01-16 DIAGNOSIS — Z006 Encounter for examination for normal comparison and control in clinical research program: Secondary | ICD-10-CM

## 2020-01-16 NOTE — Research (Signed)
Patient was not enrolled in [employer] PM  Trial due to not having an IRB approved consent. Patient proceded with standard of care [employer] device.

## 2020-01-16 NOTE — Anesthesia Postprocedure Evaluation (Signed)
Anesthesia Post Note  Patient: Vernice Bowker.  Procedure(s) Performed: PACEMAKER LEADLESS INSERTION (N/A )     Patient location during evaluation: Cath Lab Anesthesia Type: MAC Level of consciousness: awake Pain management: pain level controlled Vital Signs Assessment: post-procedure vital signs reviewed and stable Respiratory status: spontaneous breathing, nonlabored ventilation, respiratory function stable and patient connected to nasal cannula oxygen Cardiovascular status: blood pressure returned to baseline and stable Postop Assessment: no apparent nausea or vomiting Anesthetic complications: no   No complications documented.  Last Vitals:  Vitals:   01/15/20 1600 01/15/20 1605  BP: (!) 113/59 126/60  Pulse: (!) 59 60  Resp: 14 (!) 21  Temp:    SpO2: 94% 92%    Last Pain:  Vitals:   01/15/20 1133  TempSrc:   PainSc: 0-No pain                 Rahmah Mccamy P Jesika Men

## 2020-01-17 ENCOUNTER — Telehealth: Payer: Self-pay | Admitting: Internal Medicine

## 2020-01-17 ENCOUNTER — Telehealth: Payer: Self-pay

## 2020-01-17 NOTE — Telephone Encounter (Signed)
I let the pt know he can shower after 48 hours. To pat dry not rub. I also let him know to avoid climbing stairs, do not squat or lift anything over 10 lbs. He asked how long and I said until his wound check appointment or when the doctor let him know. The pt verbalized understanding.

## 2020-01-17 NOTE — Telephone Encounter (Signed)
Patient had a pacemaker implanted on Tuesday. He was sent home with the monitor but states he was so groggy after the procedure and due to the fact he is deaf in one ear he didn't not hear the instructions on how to use it. Please advise on how he uses his device.

## 2020-01-17 NOTE — Telephone Encounter (Signed)
I taught the pt on how to use the monitor. I let him know he only needs to transmit on scheduled days.

## 2020-01-29 ENCOUNTER — Ambulatory Visit (INDEPENDENT_AMBULATORY_CARE_PROVIDER_SITE_OTHER): Payer: Medicare Other | Admitting: Emergency Medicine

## 2020-01-29 ENCOUNTER — Other Ambulatory Visit: Payer: Self-pay

## 2020-01-29 DIAGNOSIS — R001 Bradycardia, unspecified: Secondary | ICD-10-CM

## 2020-01-29 LAB — CUP PACEART INCLINIC DEVICE CHECK
Date Time Interrogation Session: 20210831110438
Implantable Pulse Generator Implant Date: 20210817

## 2020-01-29 NOTE — Progress Notes (Signed)
Patient here for device check. Device checked by industry. See attached report.   Programming changes, programmed to VVIR.

## 2020-02-05 ENCOUNTER — Telehealth: Payer: Self-pay | Admitting: Internal Medicine

## 2020-02-05 ENCOUNTER — Other Ambulatory Visit: Payer: Self-pay

## 2020-02-05 ENCOUNTER — Ambulatory Visit (INDEPENDENT_AMBULATORY_CARE_PROVIDER_SITE_OTHER): Payer: Medicare Other | Admitting: Emergency Medicine

## 2020-02-05 DIAGNOSIS — I441 Atrioventricular block, second degree: Secondary | ICD-10-CM

## 2020-02-05 NOTE — Telephone Encounter (Signed)
Pt called and stated that he had a bloody discharge at the site where they put the pacemaker. Started yesterday off and on discharge

## 2020-02-05 NOTE — Telephone Encounter (Signed)
Returning patients phone call about new implant site.   Patient has micra placed via groin 01/15/20. Patient denies any complaints or site issues until a few days ago experiencing intermittent oozing of blood to groin. Patient states he placed a bandage last night and noted to have some blood on it when he woke up today. Patient denies any bleeding at this time. Patient denies any swelling, drainage, fever or chills.   Patient scheduled for wound check today at 2:30. Patient advised to come to [facility] in [place]. Patient verbalizes understanding and agreeable to plan. Device Clinic RN updated.

## 2020-02-05 NOTE — Patient Instructions (Signed)
Call if you have increased bleeding,edema or discharge at the wound site. Call if you have a fever or chills. [phone].

## 2020-02-05 NOTE — Progress Notes (Signed)
Band-aid removed from micra insertion site in R groin area. Small amount of dried blood on band-aid. No edema, redness, or drainage at wound site. Wound site has a scab at distal edge. No fever or chills reported by patient. No bruit auscultated by Dr Quentin Ore. Patient to follow up at 91 day visit with Dr  Rayann Heman on 04/21/20. Patient will contact DC if he has any change in the wound site before next appointment.

## 2020-02-11 ENCOUNTER — Other Ambulatory Visit: Payer: Self-pay | Admitting: Cardiology

## 2020-02-11 NOTE — Telephone Encounter (Signed)
*  STAT* If patient is at the pharmacy, call can be transferred to refill team.   1. Which medications need to be refilled? (please list name of each medication and dose if known) ELIQUIS 5 MG TABS tablet  2. Which pharmacy/location (including street and city if local pharmacy) is medication to be sent to? [id number] - [place], [place] - [address]  3. Do they need a 30 day or 90 day supply? 30 day supply

## 2020-02-12 MED ORDER — APIXABAN 5 MG PO TABS: 5.0000 mg | ORAL_TABLET | Freq: Two times a day (BID) | ORAL | 1 refills | Status: DC

## 2020-02-13 ENCOUNTER — Telehealth: Payer: Self-pay | Admitting: Cardiology

## 2020-02-13 NOTE — Telephone Encounter (Signed)
Pt c/o medication issue:  1. Name of Medication: apixaban (ELIQUIS) 5 MG TABS tablet  2. How are you currently taking this medication (dosage and times per day)? 1 tablet by mouth two times daily   3. Are you having a reaction (difficulty breathing--STAT)? No   4. What is your medication issue? [name] [name] is calling stating he picked up his prescription today and it cost him $140. He states he cannot afford this. Please advise.

## 2020-02-13 NOTE — Telephone Encounter (Signed)
Returned call to patient, he states he has been paying around 30-40 dollars for a 2 month supply and went to get a refill and was told it was going to be 290 for a 2 months supply.   One month at $140.   He states he is unable to afford this.    [facility].     Advised patient to complete patient assistance paperwork and samples provided.  Left paperwork and samples at front desk for pick up.   Patient aware and will complete and return.

## 2020-02-18 ENCOUNTER — Encounter: Payer: Medicare Other | Admitting: Internal Medicine

## 2020-02-27 ENCOUNTER — Other Ambulatory Visit: Payer: Self-pay

## 2020-02-27 ENCOUNTER — Ambulatory Visit: Payer: Medicare Other | Admitting: Podiatry

## 2020-02-27 ENCOUNTER — Encounter: Payer: Self-pay | Admitting: Podiatry

## 2020-02-27 DIAGNOSIS — Q828 Other specified congenital malformations of skin: Secondary | ICD-10-CM

## 2020-02-27 NOTE — Progress Notes (Signed)
Subjective:   Patient ID: [name] Camp., male   DOB: 79 y.o.   MRN: [id number]   HPI Patient presents with exquisite discomfort of the right third toe that had a chronic corn on the distal lateral aspect   ROS      Objective:  Physical Exam  Severe keratotic lesion digit 3 right with rotation of the toe     Assessment:  Keratotic lesion secondary to pressure with hammertoe deformity     Plan:  Debridement accomplished today and I want to see this patient back when it hurts again and ultimately may require other treatment for this particular condition.  Reappoint to recheck as needed

## 2020-03-17 ENCOUNTER — Telehealth: Payer: Self-pay | Admitting: Cardiology

## 2020-03-17 NOTE — Telephone Encounter (Signed)
Pt says that he really needs to speak with Dr. Gardiner Rhyme or nurse. Would not give details. Please call back

## 2020-03-17 NOTE — Telephone Encounter (Signed)
Left the patient a message to call the office back.

## 2020-03-18 ENCOUNTER — Encounter: Payer: Self-pay | Admitting: Internal Medicine

## 2020-03-18 NOTE — Telephone Encounter (Signed)
The patient has a 30 day supply of Eliquis that he just paid $150 for and cannot continue to do that. He is on [employer] and is very worried about his money/medications. Reiterated to him several times that he cannot take Eliquis once daily and he cannot cut the tablets in half as that would not be therapeutic.  Informed him a message will be sent to the PA nurse to see his options. He understands he will be called with an update soon.

## 2020-03-18 NOTE — Telephone Encounter (Signed)
error 

## 2020-03-18 NOTE — Telephone Encounter (Signed)
Left message to call back  

## 2020-03-18 NOTE — Telephone Encounter (Signed)
° ° °  Pt c/o medication issue:  1. Name of Medication:   apixaban (ELIQUIS) 5 MG TABS tablet    2. How are you currently taking this medication (dosage and times per day)? Take 1 tablet (5 mg total) by mouth 2 (two) times daily.  3. Are you having a reaction (difficulty breathing--STAT)?   4. What is your medication issue? Pt is returning call, he said the eliquis will cost him $150 for 30 days supply, he would like to know if there's samples available or less expensive alternative.

## 2020-03-19 NOTE — Telephone Encounter (Signed)
LM2CB 

## 2020-03-19 NOTE — Telephone Encounter (Signed)
**Note De-Identified Shaft Corigliano Obfuscation** I called the pt and he states that he was not given a Pt Asst application on 1/97. Please see phone note from 9/15 that I included in this note.  I advised him that a nurse from Dr. Henderson [place]  Office will be calling him back to discuss.  I Silverio Lay, RN  02/13/20 4:01 PM Note Returned call to patient, he states he has been paying around 30-40 dollars for a 2 month supply and went to get a refill and was told it was going to be 290 for a 2 months supply.   One month at $140.   He states he is unable to afford this.    [facility].     Advised patient to complete patient assistance paperwork and samples provided.  Left paperwork and samples at front desk for pick up.   Patient aware and will complete and return.

## 2020-03-21 NOTE — Telephone Encounter (Signed)
Spoke with the patient. He stated that he had not received the patient assistance forms. They have been mailed to him to fill out and send back.

## 2020-04-09 ENCOUNTER — Other Ambulatory Visit: Payer: Self-pay | Admitting: Cardiology

## 2020-04-09 NOTE — Telephone Encounter (Signed)
Eliquis 5mg  refill request received. Patient is 79 years old, weight-127kg, Crea-1.40 on 01/11/2020, Diagnosis-Aflutter, and last seen by Dr. Rayann Heman via Telemedicine on 09/19/19. Dose is appropriate based on dosing criteria. Will send in refill to requested pharmacy.

## 2020-04-11 ENCOUNTER — Telehealth: Payer: Self-pay | Admitting: Internal Medicine

## 2020-04-11 NOTE — Telephone Encounter (Signed)
New Message:      Pt would only say that he wanted a clall from Dr Rayann Heman nurse or somebody in his office. No details was given.

## 2020-04-11 NOTE — Telephone Encounter (Signed)
Left message to call back  

## 2020-04-16 ENCOUNTER — Ambulatory Visit: Payer: Medicare Other

## 2020-04-21 ENCOUNTER — Encounter: Payer: Medicare Other | Admitting: Internal Medicine

## 2020-05-22 IMAGING — US US RENAL
1 series · 14 of 25 positions shown · non-contrast
Comparison: None.

CLINICAL DATA: 77-year-old male with chronic kidney disease.

EXAM:
RENAL / URINARY TRACT ULTRASOUND COMPLETE

[Series 1: us renal · 0.26mm/px · 14 of 39 slices shown]
[im 1/39]
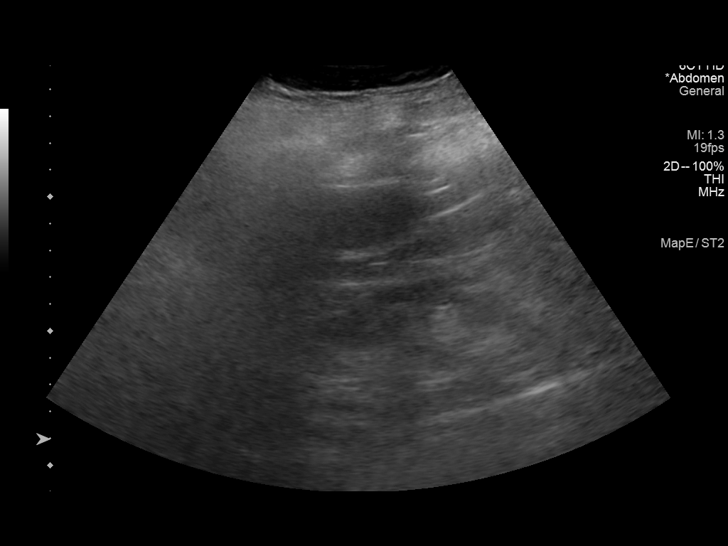
[im 4/39]
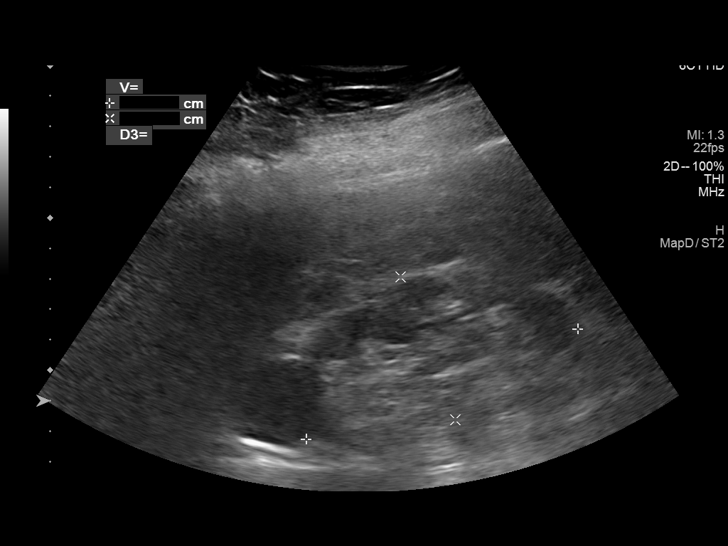
[im 7/39]
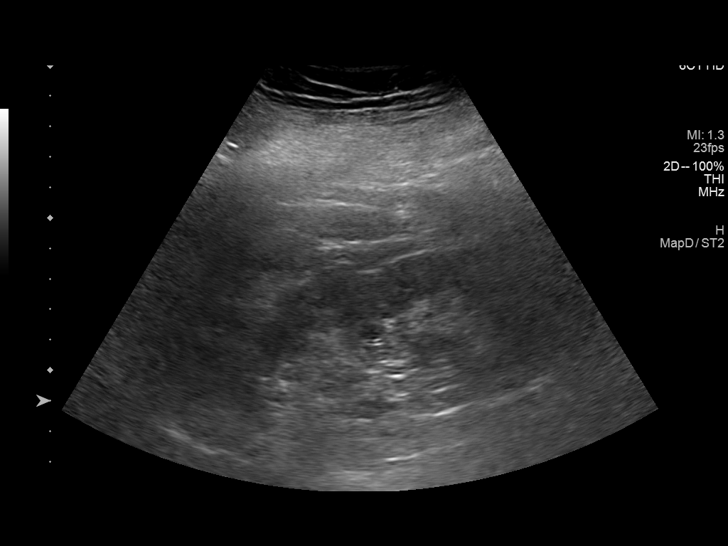
[im 10/39]
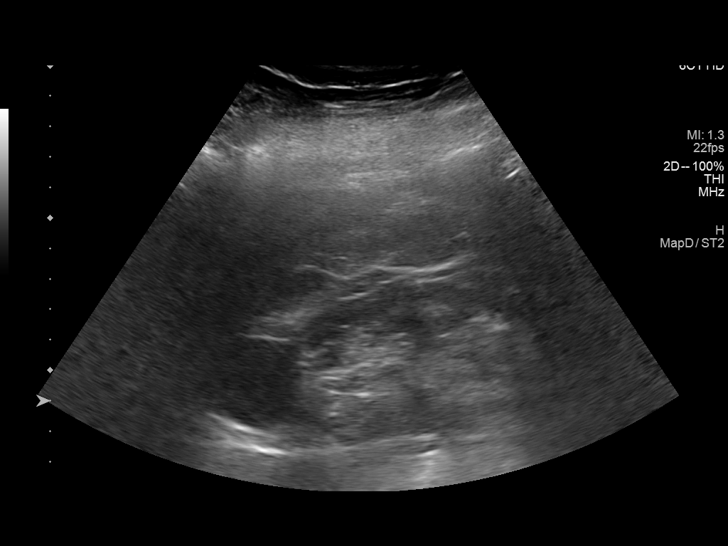
[im 13/39]
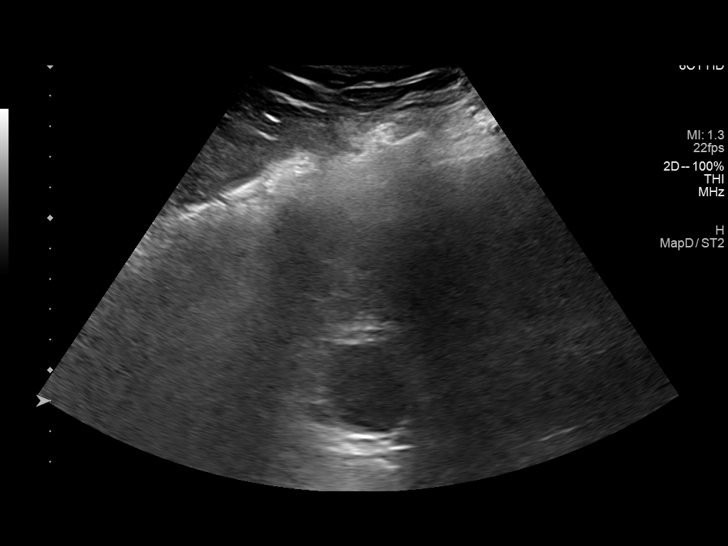
[im 15/39]
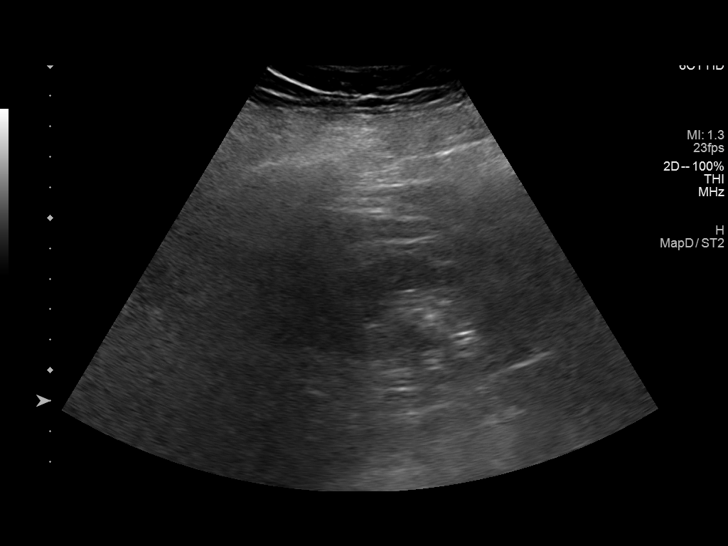
[im 18/39]
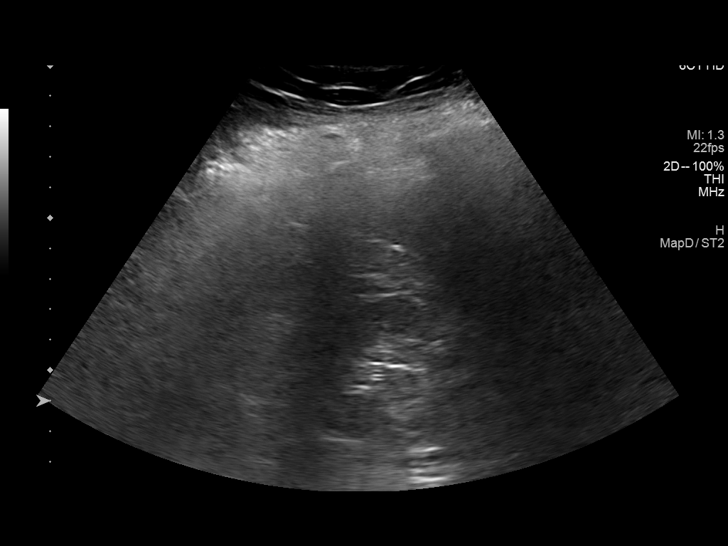
[im 21/39]
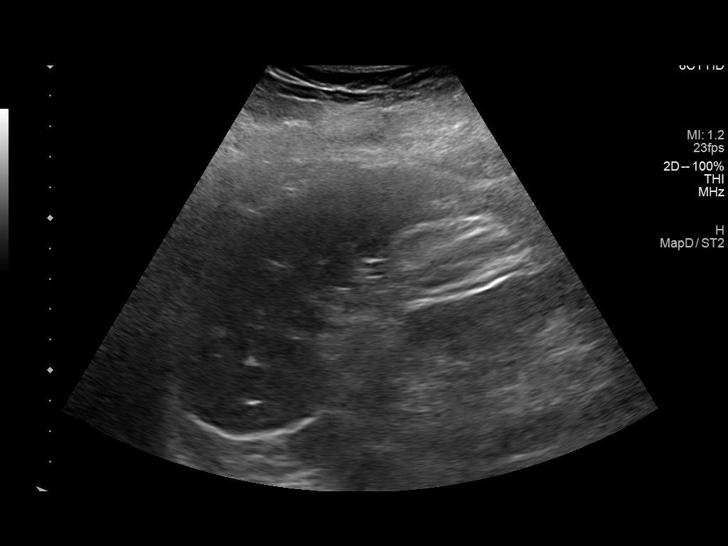
[im 24/39]
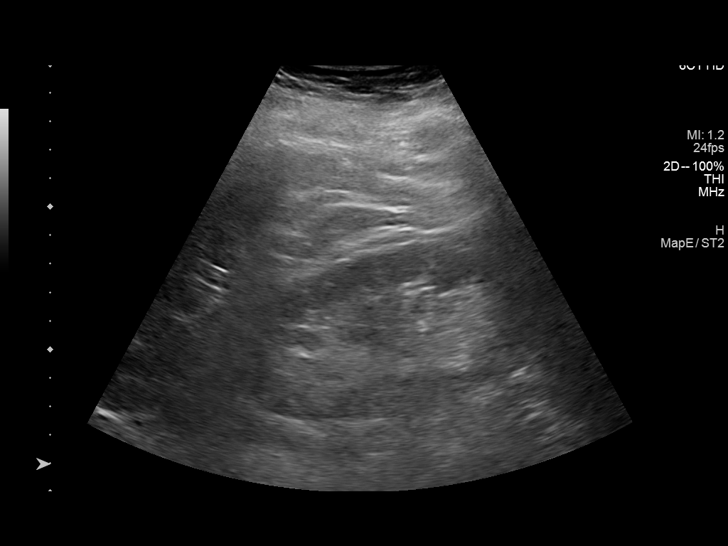
[im 26/39]
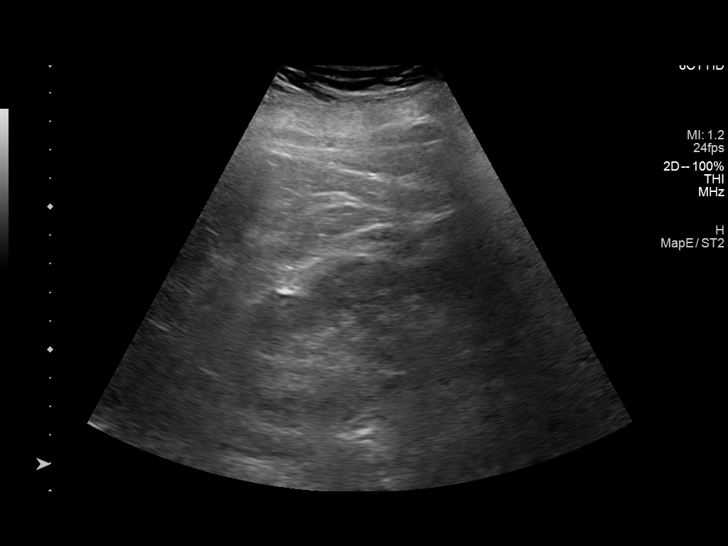
[im 29/39]
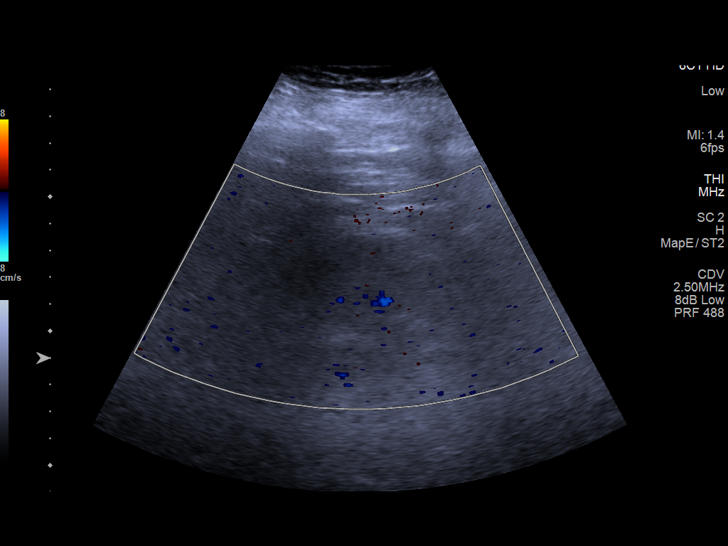
[im 32/39]
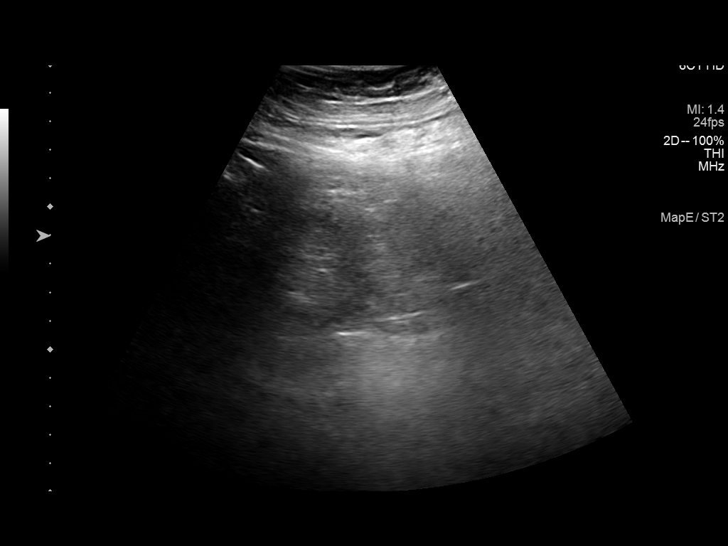
[im 35/39]
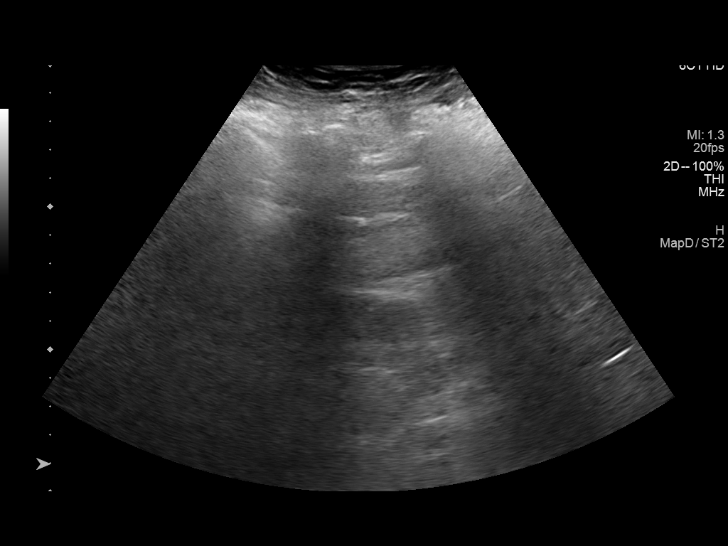
[im 39/39]
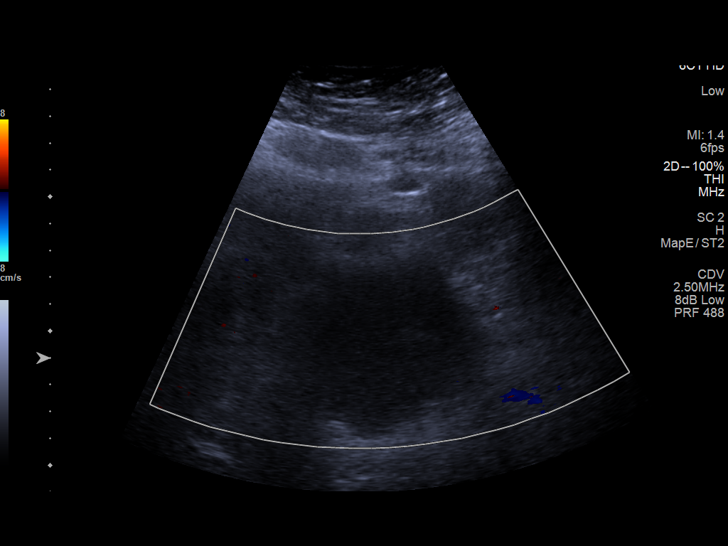

[14 of 25 positions shown; findings below may reference images not displayed]

FINDINGS: Evaluation is limited due to patient's body habitus.

Right Kidney:

Renal measurements: 9.6 x 5.0 x 5.1 cm = volume: 129 mL. There is
mild parenchyma atrophy and cortical thinning. There is no
hydronephrosis or shadowing stone. There is a 3.3 x 2.8 x 2.6 cm
hypoechoic structure with through transmission and no internal
vascularity arising from the upper pole of the right kidney. This is
suboptimally characterized but most likely represents a cyst.
Similar finding was reported the CT of the abdomen dated 07/24/2005.
Attention on follow-up imaging recommended.

Left Kidney:

Renal measurements: 10.7 x 5.5 x 6.0 cm = volume: 186 mL. Mild
parenchyma atrophy and cortical thinning. No hydronephrosis or
shadowing stone.

Bladder:

Appears normal for degree of bladder distention.
IMPRESSION: 1. Mild parenchyma atrophy.  No hydronephrosis or shadowing stone.
2. Probable right renal upper pole cyst.

## 2020-07-16 ENCOUNTER — Ambulatory Visit: Payer: Self-pay

## 2020-07-16 ENCOUNTER — Ambulatory Visit (INDEPENDENT_AMBULATORY_CARE_PROVIDER_SITE_OTHER): Payer: Medicare Other

## 2020-07-16 ENCOUNTER — Ambulatory Visit: Payer: Medicare Other | Admitting: Orthopedic Surgery

## 2020-07-16 DIAGNOSIS — M170 Bilateral primary osteoarthritis of knee: Secondary | ICD-10-CM

## 2020-07-16 DIAGNOSIS — I441 Atrioventricular block, second degree: Secondary | ICD-10-CM

## 2020-07-16 DIAGNOSIS — M25562 Pain in left knee: Secondary | ICD-10-CM | POA: Diagnosis not present

## 2020-07-16 DIAGNOSIS — M25561 Pain in right knee: Secondary | ICD-10-CM

## 2020-07-16 LAB — CUP PACEART REMOTE DEVICE CHECK
Battery Remaining Longevity: 96 mo
Battery Voltage: 3.06 V
Brady Statistic AS VP Percent: 0 %
Brady Statistic AS VS Percent: 0 %
Brady Statistic RV Percent Paced: 99.26 %
Date Time Interrogation Session: 20220216111449
Implantable Pulse Generator Implant Date: 20210817
Lead Channel Impedance Value: 570 Ohm
Lead Channel Pacing Threshold Amplitude: 0.625 V
Lead Channel Pacing Threshold Pulse Width: 0.24 ms
Lead Channel Sensing Intrinsic Amplitude: 12.488 mV
Lead Channel Setting Pacing Amplitude: 1.125
Lead Channel Setting Pacing Pulse Width: 0.24 ms
Lead Channel Setting Sensing Sensitivity: 2 mV

## 2020-07-19 ENCOUNTER — Encounter: Payer: Self-pay | Admitting: Orthopedic Surgery

## 2020-07-19 DIAGNOSIS — M25562 Pain in left knee: Secondary | ICD-10-CM

## 2020-07-19 DIAGNOSIS — M25561 Pain in right knee: Secondary | ICD-10-CM

## 2020-07-19 DIAGNOSIS — M170 Bilateral primary osteoarthritis of knee: Secondary | ICD-10-CM | POA: Diagnosis not present

## 2020-07-19 MED ORDER — BUPIVACAINE HCL 0.25 % IJ SOLN
4.0000 mL | INTRAMUSCULAR | Status: AC | PRN
  Administered 2020-07-19: 4 mL via INTRA_ARTICULAR

## 2020-07-19 MED ORDER — BETAMETHASONE SOD PHOS & ACET 6 (3-3) MG/ML IJ SUSP
6.0000 mg | INTRAMUSCULAR | Status: AC | PRN
  Administered 2020-07-19: 6 mg via INTRA_ARTICULAR

## 2020-07-19 MED ORDER — LIDOCAINE HCL 1 % IJ SOLN
5.0000 mL | INTRAMUSCULAR | Status: AC | PRN
  Administered 2020-07-19: 5 mL

## 2020-07-19 NOTE — Progress Notes (Signed)
Office Visit Note   Patient: [name] [name].           Date of Birth: Mar 14, 1941           MRN: [id number] Visit Date: 07/16/2020 Requested by: Crist Infante, MD [address] [place],  [place] [address] PCP: Crist Infante, MD  Subjective: Chief Complaint  Patient presents with  . Left Knee - Pain  . Right Knee - Pain    HPI: [name] [name] is an 80 year old patient with bilateral knee pain left worse than right.  Reports weakness and giving way as well as constant pain.  He has been ambulating with a walker.  Takes oxycodone.  Hard for him to do squats.  He does not have any rest pain but he states it is hard for him to get up from a seated position.  Does describe some knifelike pain when he is trying to get up.  He states that he is living in a rehab facility but he cannot afford physical therapy from that place.  His biggest concern is being able to get up from the seated position.  He does have end-stage arthritis in the knees but he has had that for a while.  He is also having some balance issues.  He is potentially interested in knee replacement.              ROS: All systems reviewed are negative as they relate to the chief complaint within the history of present illness.  Patient denies  fevers or chills.   Assessment & Plan: Visit Diagnoses:  1. Pain in both knees, unspecified chronicity     Plan: Impression is bilateral knee arthritis with some hip flexion weakness and primarily difficulty getting up from a seated position.  I think it will be a big stress on his body to undergo knee replacement and it will likely not help his main problem which is getting up from a seated position.  He wants to try some physical therapy which may be more economical at a different location.  We also aspirated and injected both knees today.  Had a very long talk with Earnie Larsson about knee replacement for about 20 minutes.  In general describe to him the stress on the body that any surgery is at this point in  someone's life.  Also discussed with him how knee replacement is a rigorous and often difficult recovery.  He wants to try some physical therapy first and knee injection and aspiration.  He will consider knee replacement.  We will see him back as needed  Follow-Up Instructions: Return if symptoms worsen or fail to improve.   Orders:  Orders Placed This Encounter  Procedures  . XR Knee 1-2 Views Right  . XR Knee 1-2 Views Left  . Ambulatory referral to Physical Therapy   No orders of the defined types were placed in this encounter.     Procedures: Large Joint Inj: bilateral knee on 07/19/2020 12:30 PM Indications: diagnostic evaluation, joint swelling and pain Details: 18 G 1.5 in needle, superolateral approach  Arthrogram: No  Medications (Right): 5 mL lidocaine 1 %; 4 mL bupivacaine 0.25 %; 6 mg betamethasone acetate-betamethasone sodium phosphate 6 (3-3) MG/ML Medications (Left): 5 mL lidocaine 1 %; 4 mL bupivacaine 0.25 %; 6 mg betamethasone acetate-betamethasone sodium phosphate 6 (3-3) MG/ML Outcome: tolerated well, no immediate complications Procedure, treatment alternatives, risks and benefits explained, specific risks discussed. Consent was given by the patient. Immediately prior to procedure a time  out was called to verify the correct patient, procedure, equipment, support staff and site/side marked as required. Patient was prepped and draped in the usual sterile fashion.       Clinical Data: No additional findings.  Objective: Vital Signs: There were no vitals taken for this visit.  Physical Exam:   Constitutional: Patient appears well-developed HEENT:  Head: Normocephalic Eyes:EOM are normal Neck: Normal range of motion Cardiovascular: Normal rate Pulmonary/chest: Effort normal Neurologic: Patient is alert Skin: Skin is warm Psychiatric: Patient has normal mood and affect    Ortho Exam: Ortho exam demonstrates mild effusion in both knees.  Has not least a  5 degree flexion contracture in both knees and can bend to about 100.  Collaterals are stable.  Pedal pulses palpable.  Ankle dorsiflexion intact.  No point pain with internal extra rotation of the leg.  No masses lymphadenopathy or skin changes noted in the leg region.  Slight 1+ pitting edema present in the lower extremities but no calf tenderness.  Specialty Comments:  No specialty comments available.  Imaging: No results found.   PMFS History: Patient Active Problem List   Diagnosis Date Noted  . Degeneration of lumbar intervertebral disc 12/04/2018  . Trochanteric bursitis of left hip 12/04/2018  . Seborrheic keratosis 08/17/2016  . Sprain of ankle 05/12/2016  . Rash and nonspecific skin eruption 01/06/2016  . Pain in both knees 11/25/2015  . Myalgia and myositis 11/25/2015  . Weak 11/25/2015  . Insomnia 09/03/2015  . Frequency of urination 05/27/2015  . Post herpetic Neuropathy 03/12/2015  . Depression, major, recurrent, moderate ([place]) 03/12/2015  . Stress at home 01/26/2015  . Pain in left foot 06/26/2014  . Corn of toe 01/09/2014  . Allergic rhinitis 10/01/2013  . Rotator cuff tear arthropathy of right shoulder 10/01/2013  . Pain in joint, shoulder region 01/02/2013  . HTN (hypertension)   . Partial deafness   . Obesity   . Hammertoe 10/04/2012  . CKD (chronic kidney disease) 03/22/2012  . Chronic constipation 11/13/2010   Past Medical History:  Diagnosis Date  . Atrial flutter [facility])    s/p CTI ablation by Dr Lovena Le 2013  . Calculus of kidney   . Coronary atherosclerosis of unspecified type of vessel, native or graft   . Degeneration of cervical intervertebral disc   . Depression   . DJD (degenerative joint disease)   . GERD (gastroesophageal reflux disease)   . HTN (hypertension)   . Impotence of organic origin   . Inguinal hernia without mention of obstruction or gangrene, unilateral or unspecified, (not specified as recurrent)   . Insomnia, unspecified    . Intestinal infection due to Clostridium difficile   . Irritable bowel syndrome   . Kidney stone    "they just passed" (05/03/2012)  . Obesity, unspecified   . Partial deafness   . Sacroiliitis, not elsewhere classified ([place])   . Unspecified hereditary and idiopathic peripheral neuropathy     Family History  Problem Relation Age of Onset  . Hypertension Father   . Colon cancer Neg Hx     Past Surgical History:  Procedure Laterality Date  . APPENDECTOMY  ~ 1956  . ATRIAL FLUTTER ABLATION N/A 05/03/2012   CTI ablation by Dr Lovena Le in 2013  . CARDIAC CATHETERIZATION  08/1999; 05/03/2012   normal coronary arteries;   . CARPAL TUNNEL WITH CUBITAL TUNNEL  2004   "right" (05/03/2012)  . COLONOSCOPY  06/06/2008   severe sigmoid diverticulosis and internal hemorrhoids  .  ESOPHAGOGASTRODUODENOSCOPY  06/06/2008   normal  . INGUINAL HERNIA REPAIR  03/23/11   "left" (05/03/2012)  . PACEMAKER LEADLESS INSERTION N/A 01/15/2020   Procedure: PACEMAKER LEADLESS INSERTION;  Surgeon: [place] Grayer, MD;  Location: [facility];  Service: Cardiovascular;  Laterality: N/A;  . POSTERIOR LUMBAR FUSION  1989  . reconstruction (r) foot surgery     DR SUE   . RENAL CYST EXCISION     pt denies this hx on 05/03/2012  . [place]  . SPINE SURGERY  1969   Pantopaque Myelography and spinal infusion- Dr. Lyman Speller  . TONSILLECTOMY  ~ 1946  . TOTAL HIP ARTHROPLASTY  1999-2000   bilateral   Social History   Occupational History  . Not on file  Tobacco Use  . Smoking status: Former Smoker    Packs/day: 2.00    Years: 5.00    Pack years: 10.00    Types: Cigarettes  . Smokeless tobacco: Never Used  . Tobacco comment: 05/03/2012 "smoked from age 89 to 73"  Vaping Use  . Vaping Use: Never used  Substance and Sexual Activity  . Alcohol use: Yes    Comment: "1 margaritia a week"  . Drug use: No  . Sexual activity: Yes    Partners: Female

## 2020-07-22 NOTE — Progress Notes (Signed)
Remote pacemaker transmission.   

## 2020-08-06 ENCOUNTER — Ambulatory Visit: Payer: Medicare Other | Admitting: Physical Therapy

## 2020-08-07 ENCOUNTER — Encounter: Payer: Medicare Other | Admitting: Nurse Practitioner

## 2020-08-07 NOTE — Progress Notes (Deleted)
Electrophysiology Office Note Date: 08/07/2020  ID:  [name] Mai Wendell Nicoson., DOB 1941-02-19, MRN [id number]  PCP: Crist Infante, MD Electrophysiologist: Rayann Heman  CC: Pacemaker follow-up  [name] Mai Quintel Mccalla. is a 80 y.o. male seen today for Dr Rayann Heman.  He presents today for routine electrophysiology followup.  Since last being seen in our clinic, the patient reports doing very well.  He denies chest pain, palpitations, dyspnea, PND, orthopnea, nausea, vomiting, dizziness, syncope, edema, weight gain, or early satiety.  Device History: MDT [employer] PPM implanted 2021 for Mobitz II    Past Medical History:  Diagnosis Date  . Atrial flutter [facility])    s/p CTI ablation by Dr Lovena Le 2013  . Calculus of kidney   . Coronary atherosclerosis of unspecified type of vessel, native or graft   . Degeneration of cervical intervertebral disc   . Depression   . DJD (degenerative joint disease)   . GERD (gastroesophageal reflux disease)   . HTN (hypertension)   . Impotence of organic origin   . Inguinal hernia without mention of obstruction or gangrene, unilateral or unspecified, (not specified as recurrent)   . Insomnia, unspecified   . Intestinal infection due to Clostridium difficile   . Irritable bowel syndrome   . Kidney stone    "they just passed" (05/03/2012)  . Obesity, unspecified   . Partial deafness   . Sacroiliitis, not elsewhere classified ([place])   . Unspecified hereditary and idiopathic peripheral neuropathy    Past Surgical History:  Procedure Laterality Date  . APPENDECTOMY  ~ 1956  . ATRIAL FLUTTER ABLATION N/A 05/03/2012   CTI ablation by Dr Lovena Le in 2013  . CARDIAC CATHETERIZATION  08/1999; 05/03/2012   normal coronary arteries;   . CARPAL TUNNEL WITH CUBITAL TUNNEL  2004   "right" (05/03/2012)  . COLONOSCOPY  06/06/2008   severe sigmoid diverticulosis and internal hemorrhoids  . ESOPHAGOGASTRODUODENOSCOPY  06/06/2008   normal  . INGUINAL HERNIA REPAIR  03/23/11   "left" (05/03/2012)   . PACEMAKER LEADLESS INSERTION N/A 01/15/2020   Procedure: PACEMAKER LEADLESS INSERTION;  Surgeon: [place] Grayer, MD;  Location: [facility];  Service: Cardiovascular;  Laterality: N/A;  . POSTERIOR LUMBAR FUSION  1989  . reconstruction (r) foot surgery     DR SUE   . RENAL CYST EXCISION     pt denies this hx on 05/03/2012  . [place]  . SPINE SURGERY  1969   Pantopaque Myelography and spinal infusion- Dr. Lyman Speller  . TONSILLECTOMY  ~ 1946  . TOTAL HIP ARTHROPLASTY  1999-2000   bilateral    Current Outpatient Medications  Medication Sig Dispense Refill  . amLODipine (NORVASC) 10 MG tablet Take 10 mg by mouth daily.    . Ascorbic Acid (VITAMIN C) 1000 MG tablet Take 1,000 mg by mouth daily.    Marland Kitchen ELIQUIS 5 MG TABS tablet TAKE 1 TABLET BY MOUTH TWICE A DAY 180 tablet 1  . finasteride (PROSCAR) 5 MG tablet Take 5 mg by mouth daily.    . fluocinonide cream (LIDEX) 7.20 % Apply 1 application topically 3 (three) times daily as needed (irritation).     Marland Kitchen losartan-hydrochlorothiazide (HYZAAR) 100-25 MG tablet Take 1 tablet by mouth daily.    . Multiple Vitamins-Minerals (MULTIVITAMIN WITH MINERALS) tablet Take 1 tablet by mouth daily.    Marland Kitchen nystatin (MYCOSTATIN/NYSTOP) powder Apply topically.    . ondansetron (ZOFRAN) 4 MG tablet Take 4 mg by mouth every 8 (eight) hours  as needed.    Marland Kitchen oxyCODONE (OXY IR/ROXICODONE) 5 MG immediate release tablet Take 5-10 mg by mouth every 6 (six) hours as needed for severe pain.     . sildenafil (VIAGRA) 100 MG tablet Take by mouth.    . temazepam (RESTORIL) 15 MG capsule Take 15 mg by mouth at bedtime as needed.    . triamcinolone cream (KENALOG) 0.1 % Apply 1 application topically 3 (three) times daily as needed (irritation).      No current facility-administered medications for this visit.    Allergies:   Amitriptyline, Azithromycin, and Flexeril [cyclobenzaprine]   Social History: Social History   Socioeconomic History  . Marital  status: Married    Spouse name: Not on file  . Number of children: Not on file  . Years of education: Not on file  . Highest education level: Not on file  Occupational History  . Not on file  Tobacco Use  . Smoking status: Former Smoker    Packs/day: 2.00    Years: 5.00    Pack years: 10.00    Types: Cigarettes  . Smokeless tobacco: Never Used  . Tobacco comment: 05/03/2012 "smoked from age 68 to 36"  Vaping Use  . Vaping Use: Never used  Substance and Sexual Activity  . Alcohol use: Yes    Comment: "1 margaritia a week"  . Drug use: No  . Sexual activity: Yes    Partners: Female  Other Topics Concern  . Not on file  Social History Narrative          Social Determinants of Health   Financial Resource Strain: Not on file  Food Insecurity: Not on file  Transportation Needs: Not on file  Physical Activity: Not on file  Stress: Not on file  Social Connections: Not on file  Intimate Partner Violence: Not on file    Family History: Family History  Problem Relation Age of Onset  . Hypertension Father   . Colon cancer Neg Hx      Review of Systems: All other systems reviewed and are otherwise negative except as noted above.   Physical Exam: VS:  There were no vitals taken for this visit. , BMI There is no height or weight on file to calculate BMI.  GEN- The patient is well appearing, alert and oriented x 3 today.   HEENT: normocephalic, atraumatic; sclera clear, conjunctiva pink; hearing intact; oropharynx clear; neck supple  Lungs- Clear to ausculation bilaterally, normal work of breathing.  No wheezes, rales, rhonchi Heart- Regular rate and rhythm, no murmurs, rubs or gallops  GI- soft, non-tender, non-distended, bowel sounds present  Extremities- no clubbing, cyanosis, or edema  MS- no significant deformity or atrophy Skin- warm and dry, no rash or lesion; PPM pocket well healed Psych- euthymic mood, full affect Neuro- strength and sensation are intact  PPM  Interrogation- reviewed in detail today,  See PACEART report  EKG:  EKG is not ordered today.  Recent Labs: 01/11/2020: BUN 22; Creatinine, Ser 1.40; Hemoglobin 12.0; Platelets 182; Potassium 3.8; Sodium 142   Wt Readings from Last 3 Encounters:  01/15/20 280 lb (127 kg)  11/13/19 280 lb (127 kg)  09/19/19 263 lb (119.3 kg)     Other studies Reviewed: Additional studies/ records that were reviewed today include: Dr Jackalyn Lombard note, hospital records    Assessment and Plan:  1.  Mobitz II  Normal PPM function See Pace Art report No changes today  2. Persistent atrial fibrillation Continue Eliquis for CHADS2VSAC  of 4  3.  Hypertensive cardiovascular disease Stable No change required today    Current medicines are reviewed at length with the patient today.   The patient does not have concerns regarding his medicines.  The following changes were made today:  none  Labs/ tests ordered today include: none No orders of the defined types were placed in this encounter.    Disposition:   Follow up with Dr Rayann Heman 1 year, [facility]    Signed, Chanetta Marshall, NP 08/07/2020 11:05 AM  [facility] [address] [place] [address] [phone] (office) [phone] (fax)

## 2020-08-28 IMAGING — MR MR SHOULDER*L* W/O CM
5 series · 35 of 40 positions shown · non-contrast
Comparison: Radiographs dated 02/26/2019

CLINICAL DATA: Left shoulder pain since a fall 5 weeks ago.
Weakness. Painful range of motion.

EXAM:
MRI OF THE LEFT SHOULDER WITHOUT CONTRAST
TECHNIQUE: Multiplanar, multisequence MR imaging of the shoulder was performed.
No intravenous contrast was administered.

[Series 5: PD fat-sat · oblique · 4.0mm · 0.55mm/px · 8 of 22 slices shown]
[im 1/22]
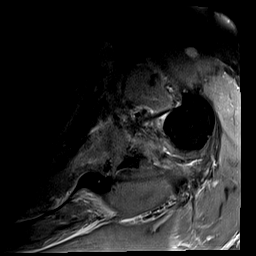
[im 4/22]
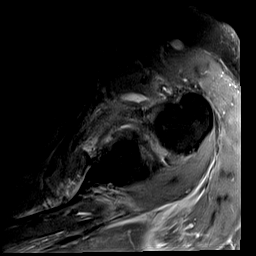
[im 7/22]
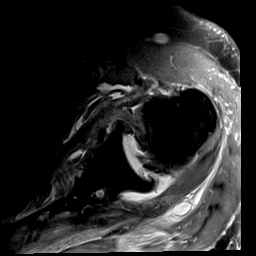
[im 10/22]
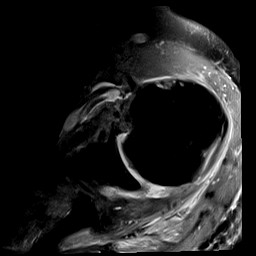
[im 13/22]
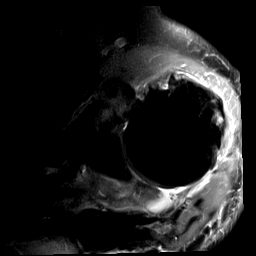
[im 16/22]
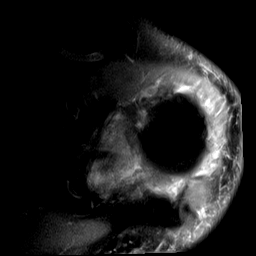
[im 19/22]
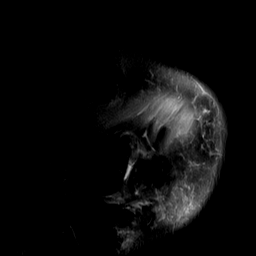
[im 22/22]
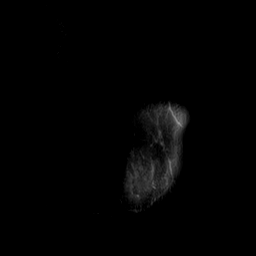

[Series 8: PD · oblique · 4.0mm · 0.27mm/px · 8 of 24 slices shown]
[im 1/24]
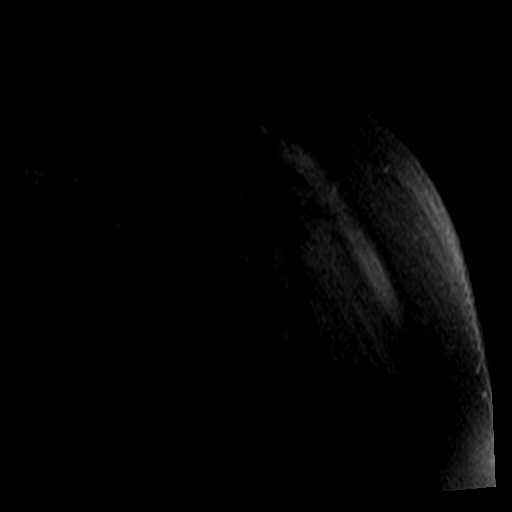
[im 4/24]
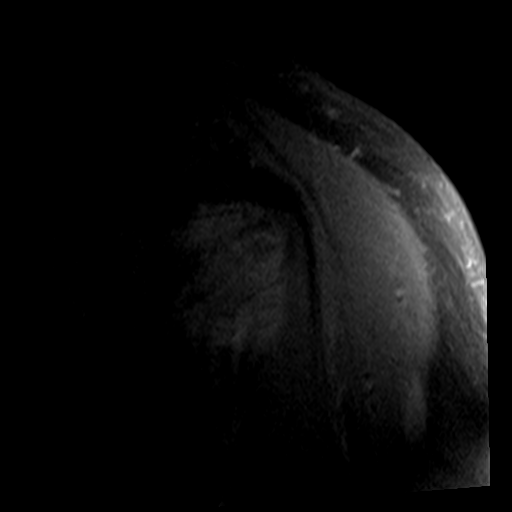
[im 7/24]
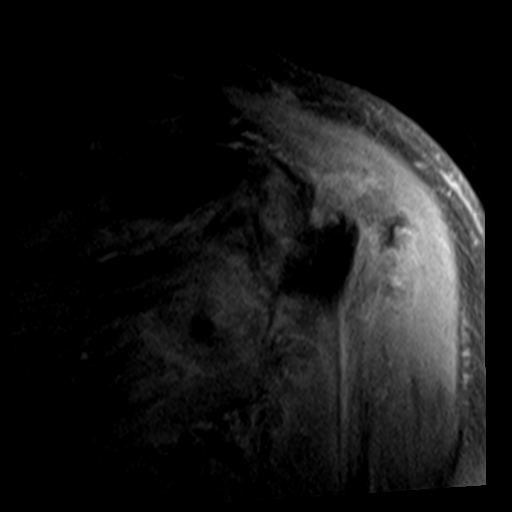
[im 10/24]
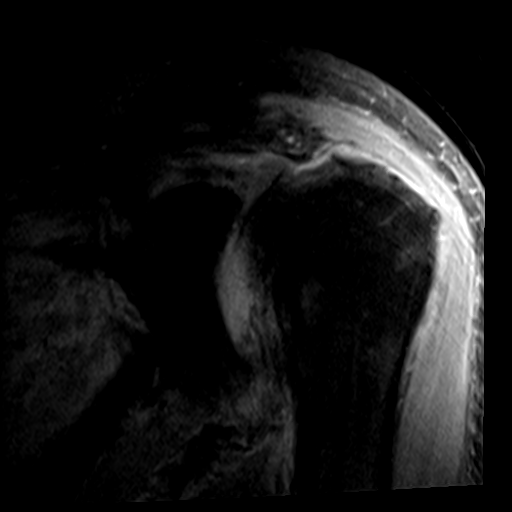
[im 14/24]
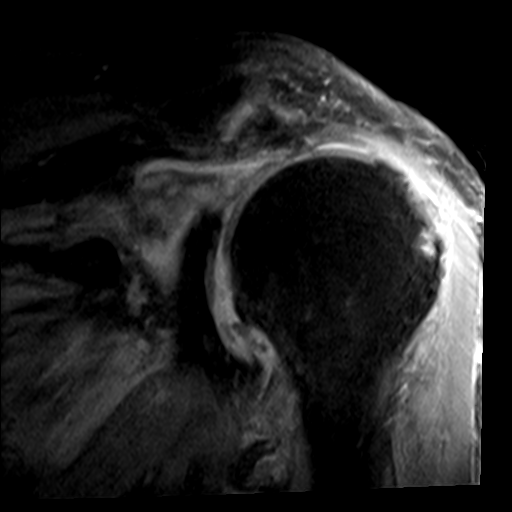
[im 17/24]
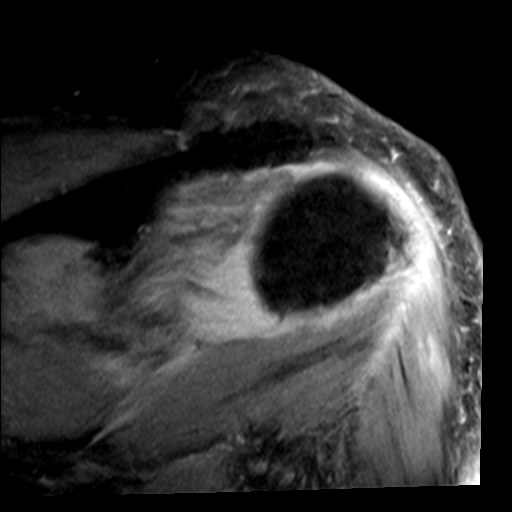
[im 20/24]
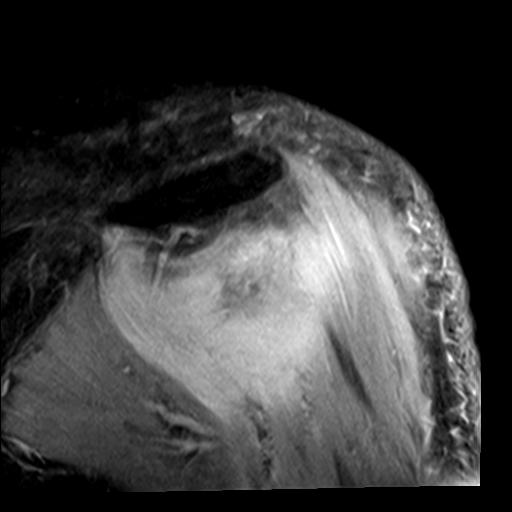
[im 24/24]
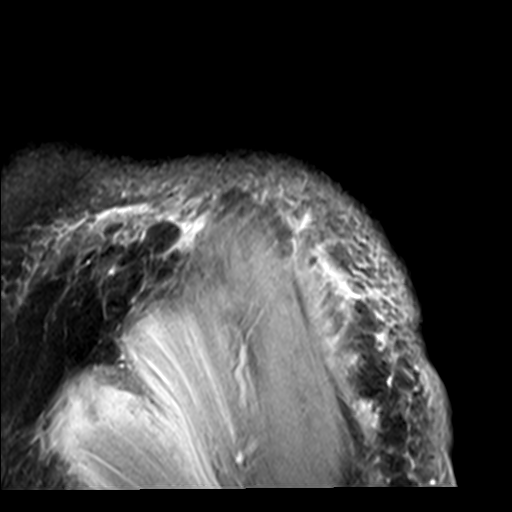

[Series 9: T1 · coronal · 4.0mm · 0.27mm/px · 3 of 22 slices shown]
[im 1/22]
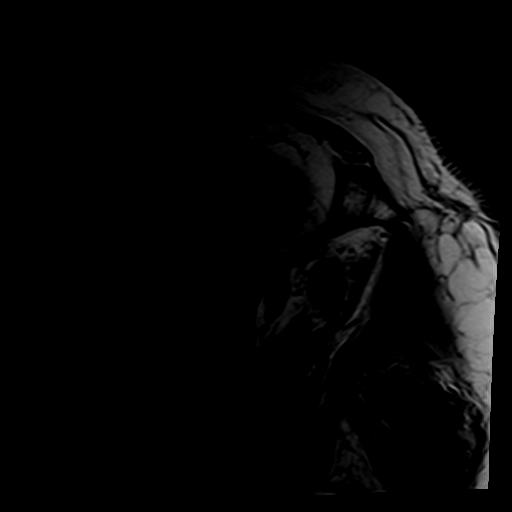
[im 4/22]
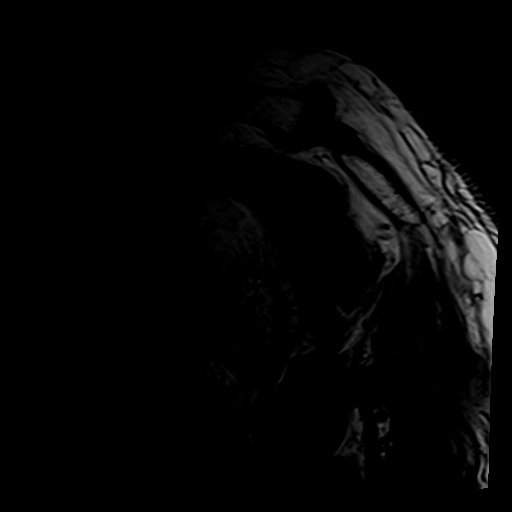
[im 7/22]
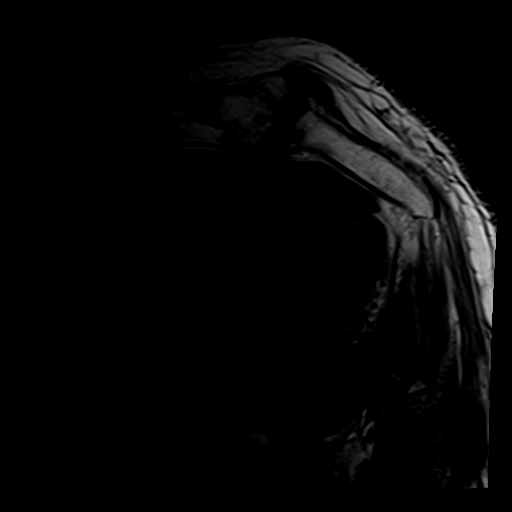

[Series 10: T2 fat-sat · coronal · 4.0mm · 0.55mm/px · 8 of 22 slices shown (1 of 2)]
[im 1/22]
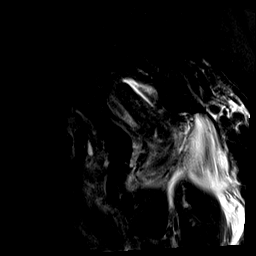
[im 4/22]
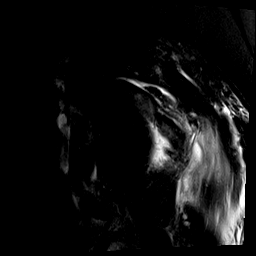
[im 7/22]
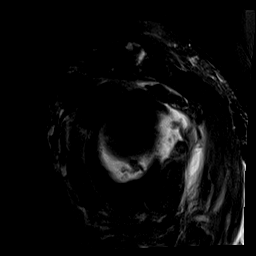
[im 10/22]
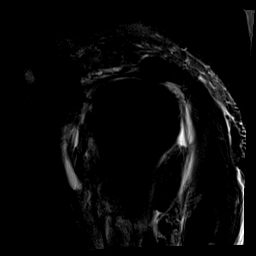
[im 13/22]
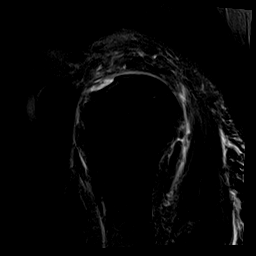
[im 16/22]
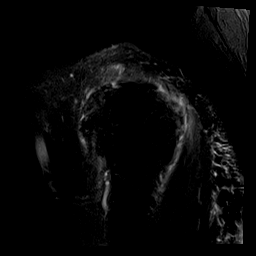
[im 19/22]
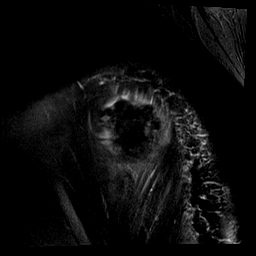
[im 22/22]
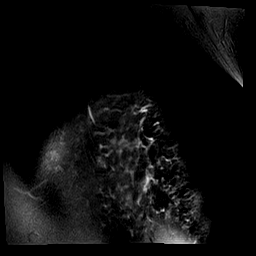

[Series 12: T2 fat-sat · oblique · 4.0mm · 0.55mm/px · 8 of 24 slices shown (2 of 2)]
[im 1/24]
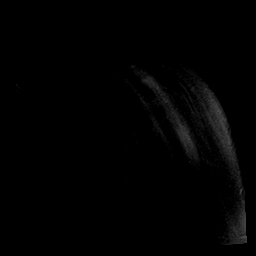
[im 4/24]
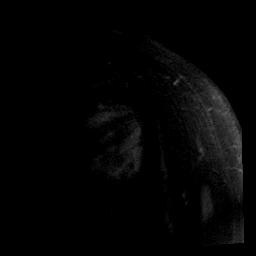
[im 7/24]
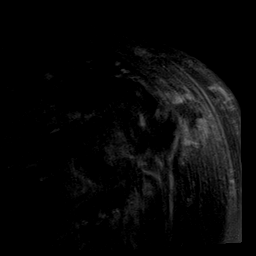
[im 10/24]
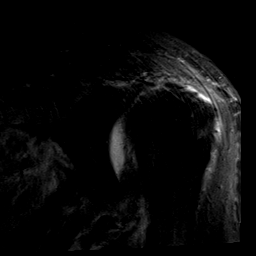
[im 14/24]
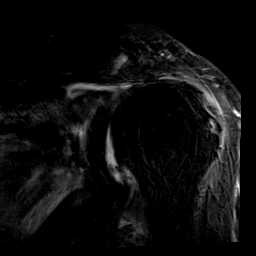
[im 17/24]
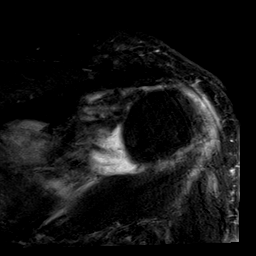
[im 20/24]
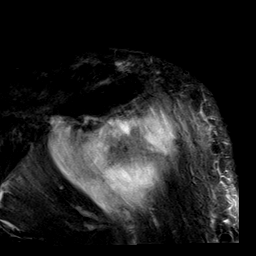
[im 24/24]
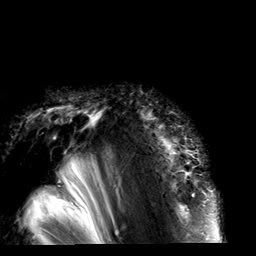

[35 of 40 positions shown; findings below may reference images not displayed]

FINDINGS: Rotator cuff: There are large full-thickness full width retracted
tears of the infraspinatus and supraspinatus tendons. The defect is
approximately 6 x 8 cm. Teres minor tendon is intact. There is
extensive degeneration of the subscapularis tendon with a small
intrasubstance tear at the insertion on the lesser tuberosity.

Muscles: There is extensive edema in the posterior aspect of the
deltoid muscle adjacent to the scapular attachment consistent with a
muscle strain. There is a complex fluid collection in the deep
fibers of posterior deltoid which may represent a seroma from
previous muscle tear. Is atrophy of the infraspinatus, supraspinatus
and subscapularis muscles.

Biceps long head: Long head of the biceps tendon is completely torn
and distally retracted.

Acromioclavicular Joint: Moderate AC joint arthropathy. Type 1
acromion.

Glenohumeral Joint: Moderate glenohumeral joint effusion with debris
in the joint. Marginal osteophyte on the inferior aspect of the
humeral head. Humeral head is superiorly subluxed and articulates
with the undersurface of the acromion.

Labrum: Diffuse degeneration of the labrum. Motion artifact obscures
detail of the labrum.

Bones: No fractures. Degenerative changes of the humeral head. No
fractures.

Other: None
IMPRESSION: 1. Large full-thickness full width retracted tears of the
infraspinatus and supraspinatus tendons.
2. Extensive strain of the posterior aspect of the deltoid muscle
adjacent to the scapular attachment.
3. Moderate glenohumeral joint effusion with debris in the joint.
4. Complete tear and distal retraction of the long head of the
biceps tendon.

## 2020-10-16 ENCOUNTER — Ambulatory Visit: Payer: Medicare Other | Admitting: Orthopedic Surgery

## 2020-10-27 ENCOUNTER — Telehealth: Payer: Self-pay | Admitting: Internal Medicine

## 2020-10-28 ENCOUNTER — Telehealth: Payer: Self-pay

## 2020-10-28 NOTE — Telephone Encounter (Signed)
Patient called stating that he is not able to wait until 11/10/2020 for appointment and would like to be worked in sooner to be seen.  CB#[phone].  Please advise.  Thank you.

## 2020-10-28 NOTE — Telephone Encounter (Signed)
Patient needs to schedule appt with Dr. Rayann Heman.

## 2020-10-28 NOTE — Telephone Encounter (Signed)
Pt requesting refill on Eliquis.  Pt is overdue to see Dr. Rayann Heman, last telemedicine ov 09/19/19. Routing to scheduling.

## 2020-10-29 NOTE — Telephone Encounter (Signed)
Tried calling patient. No answer. LMVM advising was returning call about appt. He had appt on 05/19 and cancelled. Best I could do would be next Thursday morning or next Friday afternoon with [facility].

## 2020-11-10 ENCOUNTER — Other Ambulatory Visit: Payer: Self-pay

## 2020-11-10 ENCOUNTER — Ambulatory Visit (INDEPENDENT_AMBULATORY_CARE_PROVIDER_SITE_OTHER): Payer: Medicare Other | Admitting: Orthopedic Surgery

## 2020-11-10 DIAGNOSIS — M170 Bilateral primary osteoarthritis of knee: Secondary | ICD-10-CM

## 2020-11-13 ENCOUNTER — Encounter: Payer: Self-pay | Admitting: Orthopedic Surgery

## 2020-11-13 MED ORDER — METHYLPREDNISOLONE ACETATE 40 MG/ML IJ SUSP
40.0000 mg | INTRAMUSCULAR | Status: AC | PRN
  Administered 2020-11-10: 15:00:00 40 mg via INTRA_ARTICULAR

## 2020-11-13 MED ORDER — LIDOCAINE HCL 1 % IJ SOLN
5.0000 mL | INTRAMUSCULAR | Status: AC | PRN
  Administered 2020-11-10: 15:00:00 5 mL

## 2020-11-13 MED ORDER — BUPIVACAINE HCL 0.25 % IJ SOLN
4.0000 mL | INTRAMUSCULAR | Status: AC | PRN
  Administered 2020-11-10: 15:00:00 4 mL via INTRA_ARTICULAR

## 2020-11-13 NOTE — Progress Notes (Signed)
Office Visit Note   Patient: [name] [name].           Date of Birth: [birth year]           MRN: [id number] Visit Date: 11/10/2020 Requested by: Crist Infante, MD [address] [place],   [address] PCP: Crist Infante, MD  Subjective: Chief Complaint  Patient presents with  . Left Knee - Pain    HPI: Patient presents for evaluation of left knee pain.  Had bilateral knee aspiration and injection 07/16/2020 which helped.  Lasted until about 2 or 3 weeks ago.  He actually injured his left knee by doing some home exercises 3 weeks ago.  Reported increasing swelling.  He has been using a rolling walker.  Takes Tylenol for pain which does not help much.  He has considered knee replacement in the past but has leaning more towards injections to give him relief.  He does have medical comorbidities as expected at his age.              ROS: All systems reviewed are negative as they relate to the chief complaint within the history of present illness.  Patient denies  fevers or chills.   Assessment & Plan: Visit Diagnoses:  1. Primary osteoarthritis of both knees     Plan: Impression is left knee arthritis.  Plan is injection today.  He does not have much effusion.  We will see him back in 4 months and do another injection into that knee.  We discussed knee replacement in the emotional and physical energy required to get over that procedure.  I have than patients in his age group who have done well but I think it has to be the right person for knee replacement.  In general he has been doing reasonably well with these injections which we could continue.  Plan for reassessment in 4 months with possible repeat injection at that time.  Follow-Up Instructions: Return in about 4 months (around 03/12/2021).   Orders:  No orders of the defined types were placed in this encounter.  No orders of the defined types were placed in this encounter.     Procedures: Large Joint Inj: L knee on 11/10/2020  3:00 PM Indications: diagnostic evaluation, joint swelling and pain Details: 18 G 1.5 in needle, superolateral approach  Arthrogram: No  Medications: 5 mL lidocaine 1 %; 40 mg methylPREDNISolone acetate 40 MG/ML; 4 mL bupivacaine 0.25 % Outcome: tolerated well, no immediate complications Procedure, treatment alternatives, risks and benefits explained, specific risks discussed. Consent was given by the patient. Immediately prior to procedure a time out was called to verify the correct patient, procedure, equipment, support staff and site/side marked as required. Patient was prepped and draped in the usual sterile fashion.      Clinical Data: No additional findings.  Objective: Vital Signs: There were no vitals taken for this visit.  Physical Exam:   Constitutional: Patient appears well-developed HEENT:  Head: Normocephalic Eyes:EOM are normal Neck: Normal range of motion Cardiovascular: Normal rate Pulmonary/chest: Effort normal Neurologic: Patient is alert Skin: Skin is warm Psychiatric: Patient has normal mood and affect   Ortho Exam: Ortho exam demonstrates palpable pedal pulses.  Patient has intact ankle dorsiflexion plantarflexion strength.  Has mild medial lateral joint line tenderness on the left but no effusion.  Extensor mechanism is intact on the left-hand side.  No groin pain with internal or external Tatian of the leg.  No masses lymphadenopathy or skin changes noted  in that knee region.  Specialty Comments:  No specialty comments available.  Imaging: No results found.   PMFS History: Patient Active Problem List   Diagnosis Date Noted  . Degeneration of lumbar intervertebral disc 12/04/2018  . Trochanteric bursitis of left hip 12/04/2018  . Seborrheic keratosis 08/17/2016  . Sprain of ankle 05/12/2016  . Rash and nonspecific skin eruption 01/06/2016  . Pain in both knees 11/25/2015  . Myalgia and myositis 11/25/2015  . Weak 11/25/2015  . Insomnia  09/03/2015  . Frequency of urination 05/27/2015  . Post herpetic Neuropathy 03/12/2015  . Depression, major, recurrent, moderate ([place]) 03/12/2015  . Stress at home 01/26/2015  . Pain in left foot 06/26/2014  . Corn of toe 01/09/2014  . Allergic rhinitis 10/01/2013  . Rotator cuff tear arthropathy of right shoulder 10/01/2013  . Pain in joint, shoulder region 01/02/2013  . HTN (hypertension)   . Partial deafness   . Obesity   . Hammertoe 10/04/2012  . CKD (chronic kidney disease) 03/22/2012  . Chronic constipation 11/13/2010   Past Medical History:  Diagnosis Date  . Atrial flutter [facility])    s/p CTI ablation by Dr Lovena Le 2013  . Calculus of kidney   . Coronary atherosclerosis of unspecified type of vessel, native or graft   . Degeneration of cervical intervertebral disc   . Depression   . DJD (degenerative joint disease)   . GERD (gastroesophageal reflux disease)   . HTN (hypertension)   . Impotence of organic origin   . Inguinal hernia without mention of obstruction or gangrene, unilateral or unspecified, (not specified as recurrent)   . Insomnia, unspecified   . Intestinal infection due to Clostridium difficile   . Irritable bowel syndrome   . Kidney stone    "they just passed" (05/03/2012)  . Obesity, unspecified   . Partial deafness   . Sacroiliitis, not elsewhere classified ([place])   . Unspecified hereditary and idiopathic peripheral neuropathy     Family History  Problem Relation Age of Onset  . Hypertension Father   . Colon cancer Neg Hx     Past Surgical History:  Procedure Laterality Date  . APPENDECTOMY  ~ 1956  . ATRIAL FLUTTER ABLATION N/A 05/03/2012   CTI ablation by Dr Lovena Le in 2013  . CARDIAC CATHETERIZATION  08/1999; 05/03/2012   normal coronary arteries;   . CARPAL TUNNEL WITH CUBITAL TUNNEL  2004   "right" (05/03/2012)  . COLONOSCOPY  06/06/2008   severe sigmoid diverticulosis and internal hemorrhoids  . ESOPHAGOGASTRODUODENOSCOPY  06/06/2008   normal   . INGUINAL HERNIA REPAIR  03/23/11   "left" (05/03/2012)  . PACEMAKER LEADLESS INSERTION N/A 01/15/2020   Procedure: PACEMAKER LEADLESS INSERTION;  Surgeon: Thompson Grayer, MD;  Location: [facility];  Service: Cardiovascular;  Laterality: N/A;  . POSTERIOR LUMBAR FUSION  1989  . reconstruction (r) foot surgery     DR SUE   . RENAL CYST EXCISION     pt denies this hx on 05/03/2012  . [place]  . SPINE SURGERY  1969   Pantopaque Myelography and spinal infusion- Dr. Lyman Speller  . TONSILLECTOMY  ~ 1946  . TOTAL HIP ARTHROPLASTY  1999-2000   bilateral   Social History   Occupational History  . Not on file  Tobacco Use  . Smoking status: Former    Packs/day: 2.00    Years: 5.00    Pack years: 10.00    Types: Cigarettes  . Smokeless tobacco: Never  .  Tobacco comments:    05/03/2012 "smoked from age 37 to 59"  Vaping Use  . Vaping Use: Never used  Substance and Sexual Activity  . Alcohol use: Yes    Comment: "1 margaritia a week"  . Drug use: No  . Sexual activity: Yes    Partners: Female

## 2020-12-01 ENCOUNTER — Other Ambulatory Visit: Payer: Self-pay | Admitting: Internal Medicine

## 2020-12-02 NOTE — Telephone Encounter (Signed)
Prescription refill request for Eliquis received. Indication: aflutter Last office visit: 09/19/2019, [place] Scr: 1.40, 01/11/2020 Age: 80 yo  Weight: 127 kg   Pt is on the correct dose of Eliquis per dosing criteria, prescription refill sent. Pt overdue to see Dr. Rayann Heman but is scheduled to see him on 12/29/2020

## 2020-12-29 ENCOUNTER — Ambulatory Visit: Payer: Medicare Other | Admitting: Internal Medicine

## 2020-12-29 DIAGNOSIS — I4819 Other persistent atrial fibrillation: Secondary | ICD-10-CM

## 2020-12-29 DIAGNOSIS — I10 Essential (primary) hypertension: Secondary | ICD-10-CM

## 2020-12-29 DIAGNOSIS — R001 Bradycardia, unspecified: Secondary | ICD-10-CM

## 2021-01-02 ENCOUNTER — Other Ambulatory Visit: Payer: Self-pay | Admitting: Internal Medicine

## 2021-01-02 NOTE — Telephone Encounter (Signed)
Pt last saw Dr Rayann Heman 09/19/19, pt is overdie for follow-up.  Pt has appt scheduled to see Dr Rayann Heman on 03/11/21.  Last labs 01/11/20 Creat 1.40, age 80, weight 127kg, based on specified criteria pt is on appropriate dosage of Eliquis '5mg'$  BID.  Will refill rx to get pt to upcoming appt.  Will need labwork repeated at that time as well.

## 2021-02-17 ENCOUNTER — Telehealth: Payer: Self-pay | Admitting: Internal Medicine

## 2021-02-17 DIAGNOSIS — I4891 Unspecified atrial fibrillation: Secondary | ICD-10-CM

## 2021-02-17 NOTE — Telephone Encounter (Signed)
Pt c/o medication issue:  1. Name of Medication: apixaban (ELIQUIS) 5 MG TABS tablet  2. How are you currently taking this medication (dosage and times per day)? As directed  3. Are you having a reaction (difficulty breathing--STAT)? no  4. What is your medication issue?  Medicine is too expensive. Pt picked up 30 day refill today at $141.00, he would like to know if he can get assistance with this medicine

## 2021-02-18 NOTE — Telephone Encounter (Signed)
Would also have him come in for repeat BMET and CBC, hasn't had these checked in a year and renal function needs to be recheck to ensure he remains on correct dose of Eliquis. I have placed these orders for when he calls back.

## 2021-02-18 NOTE — Telephone Encounter (Signed)
**Note De-Identified Shlok Raz Obfuscation** No answer so I left a message on the pts VM asking him to call Jeani Hawking back at Dr Bonita Quin office at [facility] at [phone].

## 2021-02-20 NOTE — Telephone Encounter (Signed)
**Note De-Identified Dayami Taitt Obfuscation** No answer so I left a message on the pts VM asking him to call Jeani Hawking at Dr Jackalyn Lombard office at [facility] at [phone].

## 2021-02-20 NOTE — Telephone Encounter (Signed)
Patient is returning call.  °

## 2021-02-23 NOTE — Telephone Encounter (Signed)
**Note De-Identified Bridget Westbrooks Obfuscation** No answer so I left a message on the pts VM advising him that I have sent him a [facility] message concerning his Eliquis and to call me back if he has any questions after reading his message.  See Patient Message (9/26) in his chart for details.

## 2021-03-11 ENCOUNTER — Encounter: Payer: Medicare Other | Admitting: Internal Medicine

## 2021-04-06 NOTE — Telephone Encounter (Signed)
**Note De-Identified Eboni Coval Obfuscation** The pt states that he has 2 days of Eliquis left and that he has a BMSPAF application at his house.  He states that he will bring his application to the office to drop off tomorrow and is aware that we are leaving him 2 boxes of Eliquis 5 mg samples to pick up while here.  He wants to know if he can take 1/2 tablet of Eliquis a day instead of 1 tablet BID as directed. I advised him that Eliquis is non-effective if not taken as prescribed and that it is dangerous for him not to take has full dose BID. I expressed the importance that he take his Eliquis as recommended.  He states that it was his understanding that once he got his pacemaker implantation in 08/21 that he would not require a blood thinner anymore.  After reviewing his chart it appears he has not been seen by Dr Rayann Heman or any of our NPs or PA-c's since then.  I advised him that I will send a message to our scheduling team requesting that they call him to schedule him a f/u with Dr Rayann Heman and for is overdue blood/lab work.  He verbalized understanding to all information given and thanked me for calling him back.

## 2021-04-06 NOTE — Telephone Encounter (Signed)
Pt dropped off letter stating Eliquis is cost prohibitive. Jeani Hawking, I see that you tried to reach out to pt in Sept regarding applying for patient assistance (and also coming for labs since he's overdue) but he didn't answer. Does not look like he read MyChart message either. Hoping you are able to try reaching out to pt again regarding this?

## 2021-04-09 ENCOUNTER — Encounter: Payer: Self-pay | Admitting: Internal Medicine

## 2021-04-09 NOTE — Telephone Encounter (Signed)
error 

## 2021-04-09 NOTE — Telephone Encounter (Signed)
Patient states he misplaced the paperwork and would like to pick it up again at the office. He states he was also told about getting samples.

## 2021-04-10 NOTE — Telephone Encounter (Signed)
Left detailed message that application was left at front desk for pick up ./cy

## 2021-04-10 NOTE — Telephone Encounter (Signed)
Patient states he was returning call. Please advise ?

## 2021-04-10 NOTE — Telephone Encounter (Signed)
Leaving pt 2 boxes of Eliquis 5 mg tablet, along with patient assistance application at the front desk for pt to pick up.  Lot: [id number]  Exp: 03/2023

## 2021-04-10 NOTE — Telephone Encounter (Signed)
**Note De-Identified [name] [name] Obfuscation** After s/w our refill dept we did not leave samples of Eliquis up front for this pt as they were unaware of the need on 11/7.  They have left him 2 boxes of Eliquis samples in the front office for him to pick up along with the BMSPAF application.  I did call the pt back and apologized for the confusion. He is aware that both Eliquis samples and a BMSPAF application is ready for him to pick up in the front office.  He states that he plans to pick up on Monday.

## 2021-04-10 NOTE — Telephone Encounter (Signed)
**Note De-Identified [name] [name] Obfuscation** Per our refills dept the pt had already picked up his Eliquis samples before they got his BMSPAF application up front for him to pick up. This would have happened prior to me s/w him this morning but he did not mention this to me so I called him back.  Thept states that he has not picked up samples of Eliquis for several weeks. I s/w him on Monday 11/7 and advised him that we were leaving his Eliquis samples in the front office for him to pick up but he states he did not and no one else picked them up for him.  I will ask our refills dept to look at the samples  log book in the front office to see who picked his Eliquis samples up.

## 2021-04-10 NOTE — Telephone Encounter (Signed)
**Note De-Identified [name] [name] Obfuscation** I s/w the pt who reports that he only has 1 Eliquis tablet left and that he cannot see well and cannot drive to the office in the bad weather today to pick up his Eliquis samples and BMSPAF application.  He states that he is going to [facility] to purchase enough Eliquis for a week and states that he will come to the office Monday 11/14 to pick up his Eliquis samples and BMSPAF application.  He thanked me for our assistance.

## 2021-04-12 NOTE — Progress Notes (Signed)
Cardiology Office Note Date:  04/16/2021  Patient ID:  [name] Hatchel., DOB June 14, 1940, MRN [id number] PCP:  Crist Infante, MD  Cardiologist:  Dr. Gardiner Rhyme Electrophysiologist: Dr. Rayann Heman    Chief Complaint:  6 mo (?)  History of Present Illness: [name] Labonte. is a 80 y.o. male with history of AFlutter (ablated) > Afib, symptomatic bradycardia w/PPM, HTN  He comes in today to be seen for Dr. Rayann Heman, last seen by him at the time of his PPM implant Aug 2021.  TODAY He feels "OK" Denies any CP, palpitations or cardiac awareness He is frustrated with the cost of Eliquis and want to stop it He thought the pacer took care of his Afib and wonders why he still needs a blood thinner  No near syncope or syncope No bleeding or signs of bleeding    Device information MDT Micra AV implanted 01/15/2020  AFib/flutter Hx CTI ablation 05/04/2012 Afib found 2020  AAD hx None to date   Past Medical History:  Diagnosis Date   Atrial flutter [facility])    s/p CTI ablation by Dr Lovena Le 2013   Calculus of kidney    Coronary atherosclerosis of unspecified type of vessel, native or graft    Degeneration of cervical intervertebral disc    Depression    DJD (degenerative joint disease)    GERD (gastroesophageal reflux disease)    HTN (hypertension)    Impotence of organic origin    Inguinal hernia without mention of obstruction or gangrene, unilateral or unspecified, (not specified as recurrent)    Insomnia, unspecified    Intestinal infection due to Clostridium difficile    Irritable bowel syndrome    Kidney stone    "they just passed" (05/03/2012)   Obesity, unspecified    Partial deafness    Sacroiliitis, not elsewhere classified ([place])    Unspecified hereditary and idiopathic peripheral neuropathy     Past Surgical History:  Procedure Laterality Date   APPENDECTOMY  ~ [place] N/A 05/03/2012   CTI ablation by Dr Lovena Le in 2013   [place]   08/1999; 05/03/2012   normal coronary arteries;    [place]  2004   "right" (05/03/2012)   COLONOSCOPY  06/06/2008   severe sigmoid diverticulosis and internal hemorrhoids   ESOPHAGOGASTRODUODENOSCOPY  06/06/2008   normal   INGUINAL HERNIA REPAIR  03/23/11   "left" (05/03/2012)   PACEMAKER LEADLESS INSERTION N/A 01/15/2020   Procedure: PACEMAKER LEADLESS INSERTION;  Surgeon: Thompson Grayer, MD;  Location: [place] [facility];  Service: Cardiovascular;  Laterality: N/A;   POSTERIOR LUMBAR FUSION  1989   reconstruction (r) foot surgery     DR SUE    RENAL CYST EXCISION     pt denies this hx on 05/03/2012   RIGHT [place]   Pantopaque Myelography and spinal infusion- Dr. Lyman Speller   TONSILLECTOMY  ~ [place]  1999-2000   bilateral    Current Outpatient Medications  Medication Sig Dispense Refill   amLODipine (NORVASC) 10 MG tablet Take 10 mg by mouth daily.     apixaban (ELIQUIS) 5 MG TABS tablet TAKE 1 TABLET BY MOUTH TWICE A DAY.  Overdue for follow-up MUST keep OV with MD for FUTURE refills. 60 tablet 2   Ascorbic Acid (VITAMIN C) 1000 MG tablet Take 1,000 mg by mouth daily.     losartan-hydrochlorothiazide (HYZAAR) 100-25  MG tablet Take 1 tablet by mouth daily.     oxyCODONE (OXY IR/ROXICODONE) 5 MG immediate release tablet Take 5-10 mg by mouth every 6 (six) hours as needed for severe pain.      No current facility-administered medications for this visit.    Allergies:   Amitriptyline, Azithromycin, and Flexeril [cyclobenzaprine]   Social History:  The patient  reports that he has quit smoking. His smoking use included cigarettes. He has a 10.00 pack-year smoking history. He has never used smokeless tobacco. He reports current alcohol use. He reports that he does not use drugs.   Family History:  The patient's family history includes Hypertension in his father.  ROS:  Please see the history of present illness.     All other systems are reviewed and otherwise negative.   PHYSICAL EXAM:  VS:  BP 108/60   Pulse 61   Ht 6\' 2"  (1.88 m)   Wt 278 lb (126.1 kg)   SpO2 98%   BMI 35.69 kg/m  BMI: Body mass index is 35.69 kg/m. Well nourished, well developed, in no acute distress HEENT: normocephalic, atraumatic Neck: no JVD, carotid bruits or masses Cardiac:  RRR; no significant murmurs, no rubs, or gallops Lungs:  CTA b/l, no wheezing, rhonchi or rales Abd: soft, nontender, obese MS: no deformity or atrophy Ext: no edema Skin: warm and dry, no rash Neuro:  No gross deficits appreciated Psych: euthymic mood, full affect   EKG:  Done today and reviewed by myself shows  Pre-device check: Asynchronous pacing 61bpm Post programming change SR, V paced, 1st degree Avblock, variable PR  Device interrogation done today and reviewed by myself:  Battery > years VP 99.% Noting SR programmed from VVIR > VDD 50 to allow her sinus rates to conduct Electrode measurements are good   05/08/2019: TTE 1. Left ventricular ejection fraction, by visual estimation, is 65 to  70%. The left ventricle has hyperdynamic function. There is severely  increased left ventricular hypertrophy.   2. Left ventricular diastolic function could not be evaluated.   3. Global right ventricle has normal systolic function.The right  ventricular size is normal. No increase in right ventricular wall  thickness.   4. Left atrial size was moderately dilated.   5. Right atrial size was normal.   6. The mitral valve is normal in structure. Mild mitral valve  regurgitation. No evidence of mitral stenosis.   7. The tricuspid valve is normal in structure. Tricuspid valve  regurgitation is not demonstrated.   8. The aortic valve is normal in structure. Aortic valve regurgitation is  not visualized. No evidence of aortic valve sclerosis or stenosis.   9. The pulmonic valve was normal in structure. Pulmonic valve  regurgitation is not  visualized.  10. Upper normal size of the ascending aorta measuring 40 mm.  11. The inferior vena cava is normal in size with greater than 50%  respiratory variability, suggesting right atrial pressure of 3 mmHg.  12. Normal pulmonary artery systolic pressure.   Recent Labs: No results found for requested labs within last 8760 hours.  No results found for requested labs within last 8760 hours.   CrCl cannot be calculated (Patient's most recent lab result is older than the maximum 21 days allowed.).   Wt Readings from Last 3 Encounters:  04/16/21 278 lb (126.1 kg)  01/15/20 280 lb (127 kg)  11/13/19 280 lb (127 kg)     Other studies reviewed: Additional studies/records reviewed today include: summarized above  ASSESSMENT AND PLAN:  PPM Intact function Programming changes as above  persistent AFib CHA2DS2Vasc is 3, on Eliquis, appropriately dosed Unclear/unknown burden Labs today  We discussed role of his pacemaker and it's limitations Discussed at length rational for a/c and Afib, his stroke risk  He appears frustrated says he has reached put about samples and the assistance program but noone has helped him.  As it turns out our office staff had the assistance application and samples prepared for him and had been waiting for him to pick up for a couple weeks its seems. He was instructed to fill out his portion of the paperwork and call us and bring in so we can do our part and send it in  We discussed Warfarin as an option if unable to get an [place] to be affordable, he was less then enthused about warfarin, but think he would be agreeable  HTN Looks OK  Disposition: F/u with remotes as usual, in clinic in 38mo, sooner if needed.  He has the numbers/contact information for patient assistance staff member.  Current medicines are reviewed at length with the patient today.  The patient did not have any concerns regarding medicines.  Venetia Night, PA-C 04/16/2021 5:18  PM     [place] [address] [phone] (office)  [phone] (fax)

## 2021-04-16 ENCOUNTER — Encounter: Payer: Self-pay | Admitting: Physician Assistant

## 2021-04-16 ENCOUNTER — Other Ambulatory Visit: Payer: Self-pay

## 2021-04-16 ENCOUNTER — Ambulatory Visit: Payer: Medicare Other | Admitting: Physician Assistant

## 2021-04-16 VITALS — BP 108/60 | HR 61 | Ht 74.0 in | Wt 278.0 lb

## 2021-04-16 DIAGNOSIS — Z79899 Other long term (current) drug therapy: Secondary | ICD-10-CM

## 2021-04-16 DIAGNOSIS — Z950 Presence of cardiac pacemaker: Secondary | ICD-10-CM

## 2021-04-16 DIAGNOSIS — I4819 Other persistent atrial fibrillation: Secondary | ICD-10-CM | POA: Diagnosis not present

## 2021-04-16 DIAGNOSIS — I10 Essential (primary) hypertension: Secondary | ICD-10-CM

## 2021-04-16 NOTE — Patient Instructions (Addendum)
Medication Instructions:   Your physician recommends that you continue on your current medications as directed. Please refer to the Current Medication list given to you today.  ONCE YOU HAVE COMPLETED APPLICATION  CALL: [phone]  TO CONTACT  [facility]  OR CLINIC  [phone] TO LEAVE A MESSAGE    *If you need a refill on your cardiac medications before your next appointment, please call your pharmacy*   Lab Work:  BMET AND CBC TODAY   If you have labs (blood work) drawn today and your tests are completely normal, you will receive your results only by: [place] (if you have MyChart) OR A paper copy in the mail If you have any lab test that is abnormal or we need to change your treatment, we will call you to review the results.   Testing/Procedures: NONE ORDERED  TODAY   Follow-Up: At [facility], you and your health needs are our priority.  As part of our continuing mission to provide you with exceptional heart care, we have created designated Provider Care Teams.  These Care Teams include your primary Cardiologist (physician) and Advanced Practice Providers (APPs -  Physician Assistants and Nurse Practitioners) who all work together to provide you with the care you need, when you need it.  We recommend signing up for the patient portal called "MyChart".  Sign up information is provided on this After Visit Summary.  MyChart is used to connect with patients for Virtual Visits (Telemedicine).  Patients are able to view lab/test results, encounter notes, upcoming appointments, etc.  Non-urgent messages can be sent to your provider as well.   To learn more about what you can do with MyChart, go to [email or web address].    Your next appointment:   6 month(s)  The format for your next appointment:   In Person  Provider:   You will see one of the following Advanced Practice Providers on your designated Care Team:   Tommye Standard, [place] Legrand Como "Jonni Sanger" Chalmers Cater,  [place]   Other Instructions

## 2021-04-17 ENCOUNTER — Other Ambulatory Visit: Payer: Self-pay | Admitting: *Deleted

## 2021-04-17 ENCOUNTER — Telehealth: Payer: Self-pay | Admitting: *Deleted

## 2021-04-17 DIAGNOSIS — Z79899 Other long term (current) drug therapy: Secondary | ICD-10-CM

## 2021-04-17 LAB — CBC
Hematocrit: 39 % (ref 37.5–51.0)
Hemoglobin: 13 g/dL (ref 13.0–17.7)
MCH: 30.9 pg (ref 26.6–33.0)
MCHC: 33.3 g/dL (ref 31.5–35.7)
MCV: 93 fL (ref 79–97)
Platelets: 220 10*3/uL (ref 150–450)
RBC: 4.21 x10E6/uL (ref 4.14–5.80)
RDW: 13.1 % (ref 11.6–15.4)
WBC: 5.3 10*3/uL (ref 3.4–10.8)

## 2021-04-17 LAB — BASIC METABOLIC PANEL
BUN/Creatinine Ratio: 17 (ref 10–24)
BUN: 28 mg/dL — ABNORMAL HIGH (ref 8–27)
CO2: 22 mmol/L (ref 20–29)
Calcium: 9.1 mg/dL (ref 8.6–10.2)
Chloride: 104 mmol/L (ref 96–106)
Creatinine, Ser: 1.64 mg/dL — ABNORMAL HIGH (ref 0.76–1.27)
Glucose: 92 mg/dL (ref 70–99)
Potassium: 4.4 mmol/L (ref 3.5–5.2)
Sodium: 141 mmol/L (ref 134–144)
eGFR: 42 mL/min/{1.73_m2} — ABNORMAL LOW (ref >59)

## 2021-04-17 MED ORDER — LOSARTAN POTASSIUM 100 MG PO TABS: 100.0000 mg | ORAL_TABLET | Freq: Every day | ORAL | 3 refills | Status: DC

## 2021-04-17 NOTE — Telephone Encounter (Signed)
Lvm for patient about labs and medication changes.Also stated will leave Mychart message also.

## 2021-04-20 MED ORDER — LOSARTAN POTASSIUM 100 MG PO TABS: 100.0000 mg | ORAL_TABLET | Freq: Every day | ORAL | 3 refills | Status: DC

## 2021-04-20 NOTE — Telephone Encounter (Signed)
Spoke with patient and verbalized understanding of medication changes  and recommendations. The correct Pharmacy to send patient medication to. Lab redraw scheduled 06-02-20

## 2021-04-20 NOTE — Addendum Note (Signed)
Addended by: Claude Manges on: 04/20/2021 04:33 PM   Modules accepted: Orders

## 2021-04-22 ENCOUNTER — Telehealth: Payer: Self-pay | Admitting: *Deleted

## 2021-04-22 NOTE — Telephone Encounter (Signed)
[place] Patient Assistance forms scanned to [place].

## 2021-04-27 NOTE — Telephone Encounter (Signed)
**Note De-Identified Janisse Ghan Obfuscation** I have completed the providers page on the pts BMSPAF application for Eliquis and have e-mailed all to Dr Jackalyn Lombard nurse so she can obtain his signature, date it, and to fax all to BMSPAF at the fax number written on the cover letter included or to place in the to be faxed basket in Medical Records to be faxed.

## 2021-04-28 NOTE — Telephone Encounter (Signed)
Printed for Dr. Rayann Heman to sign.

## 2021-04-29 NOTE — Telephone Encounter (Addendum)
MD signed and place in medical records to be faxed.

## 2021-04-30 ENCOUNTER — Other Ambulatory Visit: Payer: Self-pay | Admitting: Internal Medicine

## 2021-05-01 NOTE — Telephone Encounter (Addendum)
Prescription refill request for Eliquis received. Indication: afib  Last office visit: 04/16/2021 Scr: 1.40, 01/11/2020, 1.64, 04/16/2021 Age: 80 yo  Weight: 126.1 kg   Pt's Scr has been fluctuating. Pt is is scheduled for blood work 05/2021. Spoke with [employer] D, will refill Eliquis 5mg  BID and will continue to follow [facility]met trends.

## 2021-05-05 NOTE — Telephone Encounter (Signed)
**Note De-Identified Janvi Ammar Obfuscation** Letter received from [facility] stating that they have denied the pt asst for Eliquis. Reason: Documentation of 3% out of pocket RX expenses, based on adjusted household gross income, not met.  The letter states that they have notified the pt of this denial as well.

## 2021-06-02 ENCOUNTER — Other Ambulatory Visit: Payer: Medicare Other

## 2021-07-06 ENCOUNTER — Ambulatory Visit: Payer: Medicare Other | Admitting: Orthopedic Surgery

## 2021-07-22 ENCOUNTER — Telehealth: Payer: Self-pay | Admitting: Internal Medicine

## 2021-07-22 ENCOUNTER — Other Ambulatory Visit: Payer: Self-pay | Admitting: Internal Medicine

## 2021-07-22 NOTE — Telephone Encounter (Signed)
Patient states he is returning a call. May be regarding his missed transmission on 2/15. Looks like a letter went out to the patient on yesterday, 2/21 regarding this. Attempted to contact device clinic for advisement, but no answer.

## 2021-07-22 NOTE — Telephone Encounter (Signed)
Called and spoke to pt's wife who stated that pt was right beside her asleep. Informed her that pt's Eliquis dose is being decreased from Eliquis 5mg  tablets to Eliquis 2.5mg  tablets. Informed her that a new prescription will be sent in for Eliquis 2.5mg  tablets. Pt is to take Eliquis- 1 tablet (2.5mg ) by mouth, twice a day.  Pt's wife verbalized understanding and stated that she would update pt when he woke up.

## 2021-07-22 NOTE — Telephone Encounter (Signed)
I spoke with the patient to let him know that no one from device clinic gave him a call. It looks like someone from refills gave him a call this morning but I do not see the name.

## 2021-07-22 NOTE — Telephone Encounter (Signed)
Prescription refill request for Eliquis received.  Indication: afib  Last office visit: 04/16/2021, Charlcie Cradle Scr: 1.64, 04/16/2021 Age: 81 yo  Weight: 126.1 kg   Pt qualifies for a dose decrease of Eliquis per dosing criteria. Discussed Eliquis dosing with Megan Supple, pharm D. Will decrease pt's Eliquis to Eliquis 2.5mg  BID.    Called pt and LMOM to call clinic back.

## 2021-08-19 ENCOUNTER — Other Ambulatory Visit: Payer: Self-pay

## 2021-08-19 ENCOUNTER — Ambulatory Visit (INDEPENDENT_AMBULATORY_CARE_PROVIDER_SITE_OTHER): Payer: Medicare Other | Admitting: Surgical

## 2021-08-19 DIAGNOSIS — M170 Bilateral primary osteoarthritis of knee: Secondary | ICD-10-CM

## 2021-08-19 DIAGNOSIS — M1711 Unilateral primary osteoarthritis, right knee: Secondary | ICD-10-CM

## 2021-08-19 DIAGNOSIS — M1712 Unilateral primary osteoarthritis, left knee: Secondary | ICD-10-CM | POA: Diagnosis not present

## 2021-08-20 ENCOUNTER — Telehealth: Payer: Self-pay

## 2021-08-20 NOTE — Telephone Encounter (Signed)
Auth needed for bilat knee gel inejctions ?

## 2021-08-21 NOTE — Telephone Encounter (Signed)
Noted  

## 2021-08-22 ENCOUNTER — Encounter: Payer: Self-pay | Admitting: Orthopedic Surgery

## 2021-08-22 MED ORDER — METHYLPREDNISOLONE ACETATE 40 MG/ML IJ SUSP
40.0000 mg | INTRAMUSCULAR | Status: AC | PRN
  Administered 2021-08-19: 40 mg via INTRA_ARTICULAR

## 2021-08-22 MED ORDER — BUPIVACAINE HCL 0.25 % IJ SOLN
4.0000 mL | INTRAMUSCULAR | Status: AC | PRN
  Administered 2021-08-19: 4 mL via INTRA_ARTICULAR

## 2021-08-22 MED ORDER — LIDOCAINE HCL 1 % IJ SOLN
5.0000 mL | INTRAMUSCULAR | Status: AC | PRN
  Administered 2021-08-19: 5 mL

## 2021-08-22 NOTE — Progress Notes (Signed)
? ?Office Visit Note ?  ?Patient: [name] [name].           ?Date of Birth: [birth year]           ?MRN: [id number] ?Visit Date: 08/19/2021 ?Requested by: Crist Infante, MD ?[address] ?[place],  [place] [address] ?PCP: Crist Infante, MD ? ?Subjective: ?Chief Complaint  ?Patient presents with  ? Right Knee - Pain  ? Left Knee - Pain  ? ? ?HPI: [name] [name]. is a 81 y.o. male who presents to the office complaining of bilateral knee pain.  Patient has history of bilateral knee osteoarthritis.  Denies any new injuries.  Pain is worse with going from a sitting to standing position particularly.  Right knee bothers him more than his left.  Previous injections have helped.  Takes oxycodone 5 mg as prescribed by his PCP.  He would like a stronger pain medication for his knee pain.Marland Kitchen   ?             ?ROS: All systems reviewed are negative as they relate to the chief complaint within the history of present illness.  Patient denies fevers or chills. ? ?Assessment & Plan: ?Visit Diagnoses:  ?1. Primary osteoarthritis of both knees   ? ? ?Plan: Patient is a 81 year old male who presents for repeat evaluation of bilateral knee pain.  He has history of bilateral knee osteoarthritis.  Previous injections have provided good relief for him.  He would like to repeat these today.  Also plan to preapproved him for bilateral knee gel injections.  Tolerated both injections well.  Did caution against increasing the amount of opioid medication that he is taking.  Follow-up after gel injection approval. ? ?Follow-Up Instructions: No follow-ups on file.  ? ?Orders:  ?No orders of the defined types were placed in this encounter. ? ?No orders of the defined types were placed in this encounter. ? ? ? ? Procedures: ?Large Joint Inj: bilateral knee on 08/19/2021 4:27 PM ?Indications: diagnostic evaluation, joint swelling and pain ?Details: 18 G 1.5 in needle, superolateral approach ? ?Arthrogram: No ? ?Medications (Right): 5 mL lidocaine 1 %; 4  mL bupivacaine 0.25 %; 40 mg methylPREDNISolone acetate 40 MG/ML ?Aspirate (Right): 30 mL ?Medications (Left): 5 mL lidocaine 1 %; 4 mL bupivacaine 0.25 %; 40 mg methylPREDNISolone acetate 40 MG/ML ?Aspirate (Left): 4 mL ?Outcome: tolerated well, no immediate complications ?Procedure, treatment alternatives, risks and benefits explained, specific risks discussed. Consent was given by the patient. Immediately prior to procedure a time out was called to verify the correct patient, procedure, equipment, support staff and site/side marked as required. Patient was prepped and draped in the usual sterile fashion.  ? ? ? ? ?Clinical Data: ?No additional findings. ? ?Objective: ?Vital Signs: There were no vitals taken for this visit. ? ?Physical Exam:  ?Constitutional: Patient appears well-developed ?HEENT:  ?Head: Normocephalic ?Eyes:EOM are normal ?Neck: Normal range of motion ?Cardiovascular: Normal rate ?Pulmonary/chest: Effort normal ?Neurologic: Patient is alert ?Skin: Skin is warm ?Psychiatric: Patient has normal mood and affect ? ?Ortho Exam: Ortho exam demonstrates bilateral knees with 10 degree flexion contracture.  Moderate effusion of the right knee, small effusion of the left knee.  Tenderness over the medial lateral joint lines of both knees.  Patient is able to form straight leg raise.  No pain with hip range of motion.  No bruising or ecchymosis noted. ? ?Specialty Comments:  ?No specialty comments available. ? ?Imaging: ?No results found. ? ? ?[place]  History: ?Patient Active Problem List  ? Diagnosis Date Noted  ? Degeneration of lumbar intervertebral disc 12/04/2018  ? Trochanteric bursitis of left hip 12/04/2018  ? Seborrheic keratosis 08/17/2016  ? Sprain of ankle 05/12/2016  ? Rash and nonspecific skin eruption 01/06/2016  ? Pain in both knees 11/25/2015  ? Myalgia and myositis 11/25/2015  ? Weak 11/25/2015  ? Insomnia 09/03/2015  ? Frequency of urination 05/27/2015  ? Post herpetic Neuropathy 03/12/2015   ? Depression, major, recurrent, moderate ([place]) 03/12/2015  ? Stress at home 01/26/2015  ? Pain in left foot 06/26/2014  ? Corn of toe 01/09/2014  ? Allergic rhinitis 10/01/2013  ? Rotator cuff tear arthropathy of right shoulder 10/01/2013  ? Pain in joint, shoulder region 01/02/2013  ? HTN (hypertension)   ? Partial deafness   ? Obesity   ? Hammertoe 10/04/2012  ? CKD (chronic kidney disease) 03/22/2012  ? Chronic constipation 11/13/2010  ? ?Past Medical History:  ?Diagnosis Date  ? Atrial flutter [facility])   ? s/p CTI ablation by Dr Lovena Le 2013  ? Calculus of kidney   ? Coronary atherosclerosis of unspecified type of vessel, native or graft   ? Degeneration of cervical intervertebral disc   ? Depression   ? DJD (degenerative joint disease)   ? GERD (gastroesophageal reflux disease)   ? HTN (hypertension)   ? Impotence of organic origin   ? Inguinal hernia without mention of obstruction or gangrene, unilateral or unspecified, (not specified as recurrent)   ? Insomnia, unspecified   ? Intestinal infection due to Clostridium difficile   ? Irritable bowel syndrome   ? Kidney stone   ? "they just passed" (05/03/2012)  ? Obesity, unspecified   ? Partial deafness   ? Sacroiliitis, not elsewhere classified ([place])   ? Unspecified hereditary and idiopathic peripheral neuropathy   ?  ?Family History  ?Problem Relation Age of Onset  ? Hypertension Father   ? Colon cancer Neg Hx   ?  ?Past Surgical History:  ?Procedure Laterality Date  ? APPENDECTOMY  ~ 1956  ? ATRIAL FLUTTER ABLATION N/A 05/03/2012  ? CTI ablation by Dr Lovena Le in 2013  ? CARDIAC CATHETERIZATION  08/1999; 05/03/2012  ? normal coronary arteries;   ? CARPAL TUNNEL WITH CUBITAL TUNNEL  2004  ? "right" (05/03/2012)  ? COLONOSCOPY  06/06/2008  ? severe sigmoid diverticulosis and internal hemorrhoids  ? ESOPHAGOGASTRODUODENOSCOPY  06/06/2008  ? normal  ? INGUINAL HERNIA REPAIR  03/23/11  ? "left" (05/03/2012)  ? PACEMAKER LEADLESS INSERTION N/A 01/15/2020  ? Procedure: PACEMAKER  LEADLESS INSERTION;  Surgeon: Thompson Grayer, MD;  Location: [facility];  Service: Cardiovascular;  Laterality: N/A;  ? [place]  ? reconstruction (r) foot surgery    ? DR SUE   ? RENAL CYST EXCISION    ? pt denies this hx on 05/03/2012  ? [place]  ? [place]  ? Pantopaque Myelography and spinal infusion- Dr. Lyman Speller  ? TONSILLECTOMY  ~ 1946  ? TOTAL HIP ARTHROPLASTY  1999-2000  ? bilateral  ? ?Social History  ? ?Occupational History  ? Not on file  ?Tobacco Use  ? Smoking status: Former  ?  Packs/day: 2.00  ?  Years: 5.00  ?  Pack years: 10.00  ?  Types: Cigarettes  ? Smokeless tobacco: Never  ? Tobacco comments:  ?  05/03/2012 "smoked from age 13 to 49"  ?Vaping Use  ? Vaping  Use: Never used  ?Substance and Sexual Activity  ? Alcohol use: Yes  ?  Comment: "1 margaritia a week"  ? Drug use: No  ? Sexual activity: Yes  ?  Partners: Female  ? ? ? ? ?  ?

## 2021-10-23 ENCOUNTER — Other Ambulatory Visit: Payer: Self-pay | Admitting: Internal Medicine

## 2021-10-23 NOTE — Telephone Encounter (Signed)
Prescription refill request for Eliquis received.  Indication: aflutter Last office visit: [name] [name], 04/16/2021 Scr: 1.64, 04/16/2021 Age: 81 yo  Weight: 126.1 kg   Refill sent.

## 2021-11-04 ENCOUNTER — Ambulatory Visit: Payer: Medicare Other | Admitting: Orthopedic Surgery

## 2021-12-10 ENCOUNTER — Telehealth: Payer: Self-pay | Admitting: Orthopedic Surgery

## 2021-12-10 NOTE — Telephone Encounter (Signed)
Pt called requesting a call back from Dr. Marlou Sa or Rogelia Boga. Pt states he feel and his right knee is swollen and pained. Pt asked if he can be see today. Phone number is [phone].

## 2021-12-11 NOTE — Telephone Encounter (Signed)
Worked in for Monday.

## 2021-12-14 ENCOUNTER — Ambulatory Visit: Payer: Medicare Other | Admitting: Orthopedic Surgery

## 2021-12-14 ENCOUNTER — Ambulatory Visit (INDEPENDENT_AMBULATORY_CARE_PROVIDER_SITE_OTHER): Payer: Medicare Other

## 2021-12-14 DIAGNOSIS — M25561 Pain in right knee: Secondary | ICD-10-CM

## 2021-12-14 DIAGNOSIS — M170 Bilateral primary osteoarthritis of knee: Secondary | ICD-10-CM

## 2021-12-14 MED ORDER — TRAMADOL HCL 50 MG PO TABS
ORAL_TABLET | ORAL | 0 refills | Status: DC
Start: 2021-12-14 — End: 2021-12-23

## 2021-12-16 ENCOUNTER — Encounter: Payer: Self-pay | Admitting: Orthopedic Surgery

## 2021-12-16 MED ORDER — BUPIVACAINE HCL 0.25 % IJ SOLN
4.0000 mL | INTRAMUSCULAR | Status: AC | PRN
  Administered 2021-12-14: 4 mL via INTRA_ARTICULAR

## 2021-12-16 MED ORDER — LIDOCAINE HCL 1 % IJ SOLN
5.0000 mL | INTRAMUSCULAR | Status: AC | PRN
  Administered 2021-12-14: 5 mL

## 2021-12-16 NOTE — Progress Notes (Signed)
Office Visit Note   Patient: [name] [name].           Date of Birth: [birth year]           MRN: [id number] Visit Date: 12/14/2021 Requested by: Crist Infante, MD [address] [place],  [place] [address] PCP: Crist Infante, MD  Subjective: Chief Complaint  Patient presents with   Right Knee - Pain    HPI: [name] [name] is an 81 year old patient with right knee pain.  Had acute onset of severe pain 2 weeks ago.  Hard for him to get around.  Ambulating with a walker.  Reports severe constant pain which is worse than the pain he usually receives injections for.  Had bilateral knee cortisone injections last on 08/19/2021.  Reports swelling and giving way but he has not had any falls.  All of the pain is in the knee region.  Does have a history of back issues.  He lives with his wife.              ROS: All systems reviewed are negative as they relate to the chief complaint within the history of present illness.  Patient denies  fevers or chills.   Assessment & Plan: Visit Diagnoses:  1. Right knee pain, unspecified chronicity     Plan: Impression is right knee pain with arthritis.  Mild effusion is present.  Knee is aspirated today and injected with Toradol.  He is considering knee replacement because he really cannot go on like this but we would have to definitely consult with his cardiologist prior to undertaking that intervention.  He would be I would say at least at moderate risk of perioperative complications due to age and underlying medical conditions.  CT scan pending right knee to evaluate for occult fracture or stress fracture.  Patient cannot have MRI scanning because of his pacemaker.  Follow-Up Instructions: Return for after MRI.   Orders:  Orders Placed This Encounter  Procedures   XR Knee 1-2 Views Right   CT KNEE RIGHT WO CONTRAST   Meds ordered this encounter  Medications   traMADol (ULTRAM) 50 MG tablet    Sig: 1 po q 8 hrs prn pain    Dispense:  30 tablet    Refill:  0       Procedures: Large Joint Inj: R knee on 12/14/2021 5:37 AM Indications: diagnostic evaluation, joint swelling and pain Details: 18 G 1.5 in needle, superolateral approach  Arthrogram: No  Medications: 5 mL lidocaine 1 %; 4 mL bupivacaine 0.25 % Outcome: tolerated well, no immediate complications Procedure, treatment alternatives, risks and benefits explained, specific risks discussed. Consent was given by the patient. Immediately prior to procedure a time out was called to verify the correct patient, procedure, equipment, support staff and site/side marked as required. Patient was prepped and draped in the usual sterile fashion.    Toradol injected into the.  Aspiration of 30 cc serous fluid removed   Clinical Data: No additional findings.  Objective: Vital Signs: There were no vitals taken for this visit.  Physical Exam:   Constitutional: Patient appears well-developed HEENT:  Head: Normocephalic Eyes:EOM are normal Neck: Normal range of motion Cardiovascular: Normal rate Pulmonary/chest: Effort normal Neurologic: Patient is alert Skin: Skin is warm Psychiatric: Patient has normal mood and affect   Ortho Exam: Ortho exam demonstrates mild effusion both knees.  Extensor mechanism intact.  No groin pain with internal/external rotation of either leg.  Range of motion is about 5  to 100 degrees in both knees.  Pedal pulses palpable.  No masses lymphadenopathy or skin changes noted in that right knee region.  Specialty Comments:  No specialty comments available.  Imaging: No results found.   PMFS History: Patient Active Problem List   Diagnosis Date Noted   Degeneration of lumbar intervertebral disc 12/04/2018   Trochanteric bursitis of left hip 12/04/2018   Seborrheic keratosis 08/17/2016   Sprain of ankle 05/12/2016   Rash and nonspecific skin eruption 01/06/2016   Pain in both knees 11/25/2015   Myalgia and myositis 11/25/2015   Weak 11/25/2015   Insomnia  09/03/2015   Frequency of urination 05/27/2015   Post herpetic Neuropathy 03/12/2015   Depression, major, recurrent, moderate ([place]) 03/12/2015   Stress at home 01/26/2015   Pain in left foot 06/26/2014   Corn of toe 01/09/2014   Allergic rhinitis 10/01/2013   Rotator cuff tear arthropathy of right shoulder 10/01/2013   Pain in joint, shoulder region 01/02/2013   HTN (hypertension)    Partial deafness    Obesity    Hammertoe 10/04/2012   CKD (chronic kidney disease) 03/22/2012   Chronic constipation 11/13/2010   Past Medical History:  Diagnosis Date   Atrial flutter [facility])    s/p CTI ablation by Dr Lovena Le 2013   Calculus of kidney    Coronary atherosclerosis of unspecified type of vessel, native or graft    Degeneration of cervical intervertebral disc    Depression    DJD (degenerative joint disease)    GERD (gastroesophageal reflux disease)    HTN (hypertension)    Impotence of organic origin    Inguinal hernia without mention of obstruction or gangrene, unilateral or unspecified, (not specified as recurrent)    Insomnia, unspecified    Intestinal infection due to Clostridium difficile    Irritable bowel syndrome    Kidney stone    "they just passed" (05/03/2012)   Obesity, unspecified    Partial deafness    Sacroiliitis, not elsewhere classified ([place])    Unspecified hereditary and idiopathic peripheral neuropathy     Family History  Problem Relation Age of Onset   Hypertension Father    Colon cancer Neg Hx     Past Surgical History:  Procedure Laterality Date   APPENDECTOMY  ~ [place] N/A 05/03/2012   CTI ablation by Dr Lovena Le in 2013   [place]  08/1999; 05/03/2012   normal coronary arteries;    CARPAL TUNNEL WITH CUBITAL TUNNEL  2004   "right" (05/03/2012)   COLONOSCOPY  06/06/2008   severe sigmoid diverticulosis and internal hemorrhoids   ESOPHAGOGASTRODUODENOSCOPY  06/06/2008   normal   INGUINAL HERNIA REPAIR  03/23/11    "left" (05/03/2012)   PACEMAKER LEADLESS INSERTION N/A 01/15/2020   Procedure: PACEMAKER LEADLESS INSERTION;  Surgeon: Thompson Grayer, MD;  Location: [facility];  Service: Cardiovascular;  Laterality: N/A;   POSTERIOR LUMBAR FUSION  1989   reconstruction (r) foot surgery     DR SUE    RENAL CYST EXCISION     pt denies this hx on 05/03/2012   RIGHT [place]   Pantopaque Myelography and spinal infusion- Dr. Lyman Speller   TONSILLECTOMY  ~ [place]  1999-2000   bilateral   Social History   Occupational History   Not on file  Tobacco Use   Smoking status: Former    Packs/day: 2.00  Years: 5.00    Total pack years: 10.00    Types: Cigarettes   Smokeless tobacco: Never   Tobacco comments:    05/03/2012 "smoked from age 26 to 14"  Vaping Use   Vaping Use: Never used  Substance and Sexual Activity   Alcohol use: Yes    Comment: "1 margaritia a week"   Drug use: No   Sexual activity: Yes    Partners: Female

## 2021-12-23 ENCOUNTER — Telehealth: Payer: Self-pay | Admitting: Orthopedic Surgery

## 2021-12-23 ENCOUNTER — Other Ambulatory Visit: Payer: Self-pay | Admitting: Orthopedic Surgery

## 2021-12-23 MED ORDER — TRAMADOL HCL 50 MG PO TABS
ORAL_TABLET | ORAL | 0 refills | Status: DC
Start: 2021-12-23 — End: 2022-08-12

## 2021-12-23 NOTE — Telephone Encounter (Signed)
resent

## 2021-12-23 NOTE — Telephone Encounter (Signed)
LMVM advising 

## 2021-12-23 NOTE — Telephone Encounter (Signed)
Pt called stating that [facility]. [facility] [address] [facility]. Pt phone number is [phone].

## 2022-01-04 ENCOUNTER — Encounter: Payer: Self-pay | Admitting: *Deleted

## 2022-01-13 ENCOUNTER — Ambulatory Visit (INDEPENDENT_AMBULATORY_CARE_PROVIDER_SITE_OTHER): Payer: Medicare Other

## 2022-01-13 ENCOUNTER — Telehealth: Payer: Self-pay | Admitting: Internal Medicine

## 2022-01-13 DIAGNOSIS — I441 Atrioventricular block, second degree: Secondary | ICD-10-CM | POA: Diagnosis not present

## 2022-01-13 NOTE — Telephone Encounter (Signed)
I spoke w the patient. His cost of Eliquis has jumped up significantly and he is in donut hole gap now. He applied for patient assistance last year in November and was denied.   He is not sure why this year somethings changed that caused the cost to go up so much so early.  He is going to apply again for patient assistance.  I gave him the phone number for [employer]. He will complete his section of the application and return it to the office.  He is going to come by the [employer] tomorrow for 2 weeks of samples, as he only has 2-3 days worth of Eliquis left.  He is aware that the nurse for patient assistance for our office is out this week but will follow up with him upon her return next week.

## 2022-01-13 NOTE — Telephone Encounter (Signed)
Pt c/o medication issue:  1. Name of Medication: apixaban (ELIQUIS) 2.5 MG TABS tablet  2. How are you currently taking this medication (dosage and times per day)? As prescribed   3. Are you having a reaction (difficulty breathing--STAT)?   4. What is your medication issue? Pt states that this medication has gone from $44 to $188 and he can not afford this and would like a call back to discuss options. Please advise.

## 2022-01-13 NOTE — Telephone Encounter (Signed)
[name] [name], [facility], CPP, can you advise on this matter. I think the pt has not met his deductible and that is why is medication is high. Thanks

## 2022-01-14 ENCOUNTER — Telehealth: Payer: Self-pay | Admitting: Internal Medicine

## 2022-01-14 NOTE — Telephone Encounter (Signed)
Got information from the patient that the voicemail said they need to check his pacemaker.   Device staff stated [facility] called the patient.   Transferred call to device room.

## 2022-01-14 NOTE — Telephone Encounter (Signed)
Deductibles are at the beginning of the year, he's in the donut hole. [name] [name]'s documentation is all correct.

## 2022-01-14 NOTE — Telephone Encounter (Signed)
Patient states he was returning call. Please advise ?

## 2022-01-15 LAB — CUP PACEART REMOTE DEVICE CHECK
Battery Remaining Longevity: 94 mo
Battery Voltage: 3 V
Brady Statistic AS VP Percent: 51.49 %
Brady Statistic AS VS Percent: 0.1 %
Brady Statistic RV Percent Paced: 95.71 %
Date Time Interrogation Session: 20230817131549
Implantable Pulse Generator Implant Date: 20210817
Lead Channel Impedance Value: 570 Ohm
Lead Channel Pacing Threshold Amplitude: 0.75 V
Lead Channel Pacing Threshold Pulse Width: 0.24 ms
Lead Channel Sensing Intrinsic Amplitude: 12.713 mV
Lead Channel Setting Pacing Amplitude: 1.375
Lead Channel Setting Pacing Pulse Width: 0.24 ms
Lead Channel Setting Sensing Sensitivity: 2 mV

## 2022-01-15 NOTE — Telephone Encounter (Signed)
Thanks Mega, RPH-CPP. Leaving pt 2 weeks of Eliquis 5 mg samples and pt assistance information at the front desk at [facility] on [address].  Lot: [id number]  Exp: 08/2023

## 2022-01-15 NOTE — Telephone Encounter (Signed)
I helped the patient transmit.

## 2022-02-02 ENCOUNTER — Ambulatory Visit
Admission: RE | Admit: 2022-02-02 | Discharge: 2022-02-02 | Disposition: A | Discharge status: Home / Self Care | Payer: Medicare Other | Source: Ambulatory Visit | Attending: Orthopedic Surgery | Admitting: Orthopedic Surgery

## 2022-02-02 DIAGNOSIS — M25561 Pain in right knee: Secondary | ICD-10-CM

## 2022-02-03 ENCOUNTER — Telehealth: Payer: Self-pay | Admitting: Orthopedic Surgery

## 2022-02-03 NOTE — Telephone Encounter (Signed)
Pt called requesting a call to go over CT Scan instead of in office. Pt phone number is [phone]

## 2022-02-03 NOTE — Telephone Encounter (Signed)
Patient called asked if he can get the results of his CT scan over the phone? The number to contact patient is [phone]

## 2022-02-03 NOTE — Telephone Encounter (Signed)
Right knee CT scan done yesterday, please advise

## 2022-02-04 ENCOUNTER — Telehealth: Payer: Self-pay | Admitting: Orthopedic Surgery

## 2022-02-04 NOTE — Telephone Encounter (Signed)
Pt called again requesting for his results of CT Scan be read to him over the phone. Please call pt about this matter at [phone].

## 2022-02-05 NOTE — Telephone Encounter (Signed)
IC to schedule but patient stated he needed to be seen sooner than your next available.  He said he needs something for pain.  Is asking for you to call him since your next available is not until 10/02

## 2022-02-05 NOTE — Telephone Encounter (Signed)
I called to review. Can you schedule appointment with Dr Marlou Sa in an afternoon to see [name] and call him with date/time?

## 2022-02-10 NOTE — Progress Notes (Signed)
I called and left message on machine stating that we could potentially refill tramadol if it was helping.  CT scan showed no fracture but just arthritis.

## 2022-02-12 NOTE — Progress Notes (Signed)
Remote pacemaker transmission.   

## 2022-03-01 ENCOUNTER — Ambulatory Visit: Payer: Medicare Other | Admitting: Orthopedic Surgery

## 2022-03-10 ENCOUNTER — Ambulatory Visit: Payer: Medicare Other | Admitting: Orthopedic Surgery

## 2022-03-10 DIAGNOSIS — M170 Bilateral primary osteoarthritis of knee: Secondary | ICD-10-CM | POA: Diagnosis not present

## 2022-03-10 MED ORDER — OXYCODONE HCL 5 MG PO TABS: 5.0000 mg | ORAL_TABLET | ORAL | 0 refills | Status: DC | PRN

## 2022-03-10 NOTE — Progress Notes (Signed)
Office Visit Note   Patient: [name] [name].           Date of Birth: 1941-02-10           MRN: [id number] Visit Date: 03/10/2022 Requested by: Crist Infante, MD [address] [place],  [place] [address] PCP: Crist Infante, MD  Subjective: Chief Complaint  Patient presents with   Left Elbow - Pain   Right Knee - Pain    HPI: [name] [name]. is a 81 y.o. male who presents to the office reporting bilateral knee pain as well as left elbow pain.  He has tried oxycodone from his wife/neighbor which has helped his pain.  Does have a history of taking oxycodone up to 180 tablets a month years ago from his primary care physician who is now retired.  He would like a lift chair prescription.  Has a history of bilateral hip replacements as well as severe end-stage arthritis and back arthritis.  He also describes some left elbow pain..                ROS: All systems reviewed are negative as they relate to the chief complaint within the history of present illness.  Patient denies fevers or chills.  Assessment & Plan: Visit Diagnoses:  1. Primary osteoarthritis of both knees     Plan: Impression is end-stage bilateral knee arthritis with no real improvement from nonoperative measures.  Not the greatest candidate for knee replacement due to medical comorbidities.  He states he did get relief from oxycodone.  Told him that really not doing long-term pain prescriptions in this office even though he has a history of taking oxycodone several years ago.  I would like to get him a prescription for a lift chair because of his lower extremity maladies.  One-time prescription for oxycodone written and we will also refer him to pain management for further evaluation and management of his knee arthritis pain.  Did caution him against becoming addicted to the opioids.  I do not think that since he has had these in the past that there is a significant fall risk associated with this medication for him.  Follow-up  as needed. Follow-Up Instructions: No follow-ups on file.   Orders:  No orders of the defined types were placed in this encounter.  Meds ordered this encounter  Medications   oxyCODONE (OXY IR/ROXICODONE) 5 MG immediate release tablet    Sig: Take 1 tablet (5 mg total) by mouth every 4 (four) hours as needed for severe pain.    Dispense:  45 tablet    Refill:  0      Procedures: No procedures performed   Clinical Data: No additional findings.  Objective: Vital Signs: There were no vitals taken for this visit.  Physical Exam:  Constitutional: Patient appears well-developed HEENT:  Head: Normocephalic Eyes:EOM are normal Neck: Normal range of motion Cardiovascular: Normal rate Pulmonary/chest: Effort normal Neurologic: Patient is alert Skin: Skin is warm Psychiatric: Patient has normal mood and affect  Ortho Exam: Ortho exam demonstrates no groin pain with internal/external rotation of the leg.  He does have significant bilateral knee arthritis with flexion contractures of about 10 to 15 degrees bilaterally.  Both feet are perfused and sensate.  He does have some 5- out of 5 hip flexion strength bilaterally.  No trochanteric tenderness is present.  Does have some difficulty going from the seated to standing position.  Does use a walker.  Left elbow is examined  and he has full active and passive range of motion with mild tenderness over the lateral epicondyle worse with wrist extension and finger extension.  Specialty Comments:  No specialty comments available.  Imaging: No results found.   PMFS History: Patient Active Problem List   Diagnosis Date Noted   Degeneration of lumbar intervertebral disc 12/04/2018   Trochanteric bursitis of left hip 12/04/2018   Seborrheic keratosis 08/17/2016   Sprain of ankle 05/12/2016   Rash and nonspecific skin eruption 01/06/2016   Pain in both knees 11/25/2015   Myalgia and myositis 11/25/2015   Weak 11/25/2015   Insomnia  09/03/2015   Frequency of urination 05/27/2015   Post herpetic Neuropathy 03/12/2015   Depression, major, recurrent, moderate ([place]) 03/12/2015   Stress at home 01/26/2015   Pain in left foot 06/26/2014   Corn of toe 01/09/2014   Allergic rhinitis 10/01/2013   Rotator cuff tear arthropathy of right shoulder 10/01/2013   Pain in joint, shoulder region 01/02/2013   HTN (hypertension)    Partial deafness    Obesity    Hammertoe 10/04/2012   CKD (chronic kidney disease) 03/22/2012   Chronic constipation 11/13/2010   Past Medical History:  Diagnosis Date   Atrial flutter [facility])    s/p CTI ablation by Dr Lovena Le 2013   Calculus of kidney    Coronary atherosclerosis of unspecified type of vessel, native or graft    Degeneration of cervical intervertebral disc    Depression    DJD (degenerative joint disease)    GERD (gastroesophageal reflux disease)    HTN (hypertension)    Impotence of organic origin    Inguinal hernia without mention of obstruction or gangrene, unilateral or unspecified, (not specified as recurrent)    Insomnia, unspecified    Intestinal infection due to Clostridium difficile    Irritable bowel syndrome    Kidney stone    "they just passed" (05/03/2012)   Obesity, unspecified    Partial deafness    Sacroiliitis, not elsewhere classified ([place])    Unspecified hereditary and idiopathic peripheral neuropathy     Family History  Problem Relation Age of Onset   Hypertension Father    Colon cancer Neg Hx     Past Surgical History:  Procedure Laterality Date   APPENDECTOMY  ~ [place] N/A 05/03/2012   CTI ablation by Dr Lovena Le in 2013   [place]  08/1999; 05/03/2012   normal coronary arteries;    CARPAL TUNNEL WITH CUBITAL TUNNEL  2004   "right" (05/03/2012)   COLONOSCOPY  06/06/2008   severe sigmoid diverticulosis and internal hemorrhoids   ESOPHAGOGASTRODUODENOSCOPY  06/06/2008   normal   INGUINAL HERNIA REPAIR  03/23/11    "left" (05/03/2012)   PACEMAKER LEADLESS INSERTION N/A 01/15/2020   Procedure: PACEMAKER LEADLESS INSERTION;  Surgeon: Thompson Grayer, MD;  Location: [facility];  Service: Cardiovascular;  Laterality: N/A;   POSTERIOR LUMBAR FUSION  1989   reconstruction (r) foot surgery     DR SUE    RENAL CYST EXCISION     pt denies this hx on 05/03/2012   RIGHT [place]   Pantopaque Myelography and spinal infusion- Dr. Lyman Speller   TONSILLECTOMY  ~ [place]  1999-2000   bilateral   Social History   Occupational History   Not on file  Tobacco Use   Smoking status: Former    Packs/day: 2.00  Years: 5.00    Total pack years: 10.00    Types: Cigarettes   Smokeless tobacco: Never   Tobacco comments:    05/03/2012 "smoked from age 23 to 57"  Vaping Use   Vaping Use: Never used  Substance and Sexual Activity   Alcohol use: Yes    Comment: "1 margaritia a week"   Drug use: No   Sexual activity: Yes    Partners: Female

## 2022-03-11 ENCOUNTER — Encounter: Payer: Self-pay | Admitting: Orthopedic Surgery

## 2022-03-11 NOTE — Addendum Note (Signed)
Addended by: Laurann Montana on: 03/11/2022 11:01 AM   Modules accepted: Orders

## 2022-06-01 ENCOUNTER — Ambulatory Visit: Payer: Medicare Other | Admitting: Orthopedic Surgery

## 2022-07-05 ENCOUNTER — Telehealth: Payer: Self-pay | Admitting: Physician Assistant

## 2022-07-05 ENCOUNTER — Other Ambulatory Visit: Payer: Self-pay

## 2022-07-05 MED ORDER — APIXABAN 2.5 MG PO TABS: 2.5000 mg | ORAL_TABLET | Freq: Two times a day (BID) | ORAL | 0 refills | Status: DC

## 2022-07-05 NOTE — Telephone Encounter (Signed)
*  STAT* If patient is at the pharmacy, call can be transferred to refill team.   1. Which medications need to be refilled? (please list name of each medication and dose if known) apixaban (ELIQUIS) 2.5 MG TABS tablet   2. Which pharmacy/location (including street and city if local pharmacy) is medication to be sent to? [id number] [place] - [address]   3. Do they need a 30 day or 90 day supply? 30 day   Patient is completely out of medication.

## 2022-07-05 NOTE — Telephone Encounter (Signed)
Eliquis refill sent in previous encounter. This is a duplicate will not re-send as pt is overdue for an appt.

## 2022-07-05 NOTE — Telephone Encounter (Signed)
Prescription refill request for Eliquis received. Indication:aflutter Last office visit:needs appt Scr:1.6 Age: 82 Weight:126.1  kg  Prescription refilled

## 2022-07-14 ENCOUNTER — Ambulatory Visit: Payer: Medicare Other

## 2022-07-14 DIAGNOSIS — I441 Atrioventricular block, second degree: Secondary | ICD-10-CM | POA: Diagnosis not present

## 2022-07-14 LAB — CUP PACEART REMOTE DEVICE CHECK
Battery Remaining Longevity: 87 mo
Battery Voltage: 2.99 V
Brady Statistic AS VP Percent: 48.64 %
Brady Statistic AS VS Percent: 0.1 %
Brady Statistic RV Percent Paced: 96.35 %
Date Time Interrogation Session: 20240214114549
Implantable Pulse Generator Implant Date: 20210817
Lead Channel Impedance Value: 600 Ohm
Lead Channel Pacing Threshold Amplitude: 0.875 V
Lead Channel Pacing Threshold Pulse Width: 0.24 ms
Lead Channel Sensing Intrinsic Amplitude: 13.388 mV
Lead Channel Setting Pacing Amplitude: 1.5 V
Lead Channel Setting Pacing Pulse Width: 0.24 ms
Lead Channel Setting Sensing Sensitivity: 2 mV

## 2022-07-15 ENCOUNTER — Telehealth: Payer: Self-pay

## 2022-07-15 NOTE — Telephone Encounter (Signed)
Received the following from [employer]: Transmission sent 07/14/22:  Scheduled remote reviewed. Normal device function.   AM-VP=48.6%, VP only 47.7%,  A4=0.42ms2.  Next remote 91 days. Re-route for possible optimizaiton AV Conduction Mode Switch 0 % LA  Reviewed with [employer] Rep, [place].  Presenting shows irregular V-V intervals which meets criteria for AFIB, likely cause for increased v pacing and low AM V pacing synchronization.   Patient has known AF and is on an OMerrill

## 2022-07-21 DIAGNOSIS — R001 Bradycardia, unspecified: Secondary | ICD-10-CM | POA: Insufficient documentation

## 2022-07-21 DIAGNOSIS — Z950 Presence of cardiac pacemaker: Secondary | ICD-10-CM | POA: Insufficient documentation

## 2022-07-21 DIAGNOSIS — I480 Paroxysmal atrial fibrillation: Secondary | ICD-10-CM | POA: Insufficient documentation

## 2022-07-21 NOTE — Progress Notes (Deleted)
Cardiology Office Note Date:  07/21/2022  Patient ID:  [name] Fogerty., DOB [birth year], MRN [id number] PCP:  Crist Infante, MD  Cardiologist:  None Electrophysiologist: Thompson Grayer, MD >> None (though hs not met)  ***refresh   Chief Complaint: 1 year follow-up PPM, past due  History of Present Illness: [name] Amie. is a 82 y.o. male with PMH notable for Aflutter, Afib, symptomatic bradycardia s/p PPM, HTN; seen today for None for routine electrophysiology followup.   Saw PA Charlcie Cradle 03/2021, high Vpacing, but patient in SR; adjusted mode to VDD to allow SR to conduct. Frustrated by cost of eliquis, needing to fill out patient assistance paperwork, discussed warfarin is option. Cr elevated, stopped HCTZ  *** bleeding concerns *** palpitations, AF symptoms *** new MD, WC reading remotes, lives in [place]   Since last being seen in our clinic the patient reports doing ***.  he denies chest pain, palpitations, dyspnea, PND, orthopnea, nausea, vomiting, dizziness, syncope, edema, weight gain, or early satiety.     Device Information: MDT Micra AV, imp 12/2019  AFib/flutter Hx CTI ablation 05/04/2012 Afib found 2020  AAD History: None  Past Medical History:  Diagnosis Date   Atrial flutter ([name] [name])    s/p CTI ablation by Dr Lovena Le 2013   Calculus of kidney    Coronary atherosclerosis of unspecified type of vessel, native or graft    Degeneration of cervical intervertebral disc    Depression    DJD (degenerative joint disease)    GERD (gastroesophageal reflux disease)    HTN (hypertension)    Impotence of organic origin    Inguinal hernia without mention of obstruction or gangrene, unilateral or unspecified, (not specified as recurrent)    Insomnia, unspecified    Intestinal infection due to Clostridium difficile    Irritable bowel syndrome    Kidney stone    "they just passed" (05/03/2012)   Obesity, unspecified    Partial deafness    Sacroiliitis, not elsewhere  classified ([place])    Unspecified hereditary and idiopathic peripheral neuropathy     Past Surgical History:  Procedure Laterality Date   APPENDECTOMY  ~ [place] N/A 05/03/2012   CTI ablation by Dr Lovena Le in 2013   [place]  08/1999; 05/03/2012   normal coronary arteries;    CARPAL TUNNEL WITH CUBITAL TUNNEL  2004   "right" (05/03/2012)   COLONOSCOPY  06/06/2008   severe sigmoid diverticulosis and internal hemorrhoids   ESOPHAGOGASTRODUODENOSCOPY  06/06/2008   normal   INGUINAL HERNIA REPAIR  03/23/11   "left" (05/03/2012)   PACEMAKER LEADLESS INSERTION N/A 01/15/2020   Procedure: PACEMAKER LEADLESS INSERTION;  Surgeon: Thompson Grayer, MD;  Location: [facility];  Service: Cardiovascular;  Laterality: N/A;   POSTERIOR LUMBAR FUSION  1989   reconstruction (r) foot surgery     DR SUE    RENAL CYST EXCISION     pt denies this hx on 05/03/2012   RIGHT [place]   Pantopaque Myelography and spinal infusion- Dr. Lyman Speller   TONSILLECTOMY  ~ [place]  1999-2000   bilateral    Current Outpatient Medications  Medication Instructions   amLODipine (NORVASC) 10 mg, Oral, Daily   apixaban (ELIQUIS) 2.5 mg, Oral, 2 times daily, NEEDS CARDIOLOGY APPT, CALL OFFICE   losartan (COZAAR) 100 mg, Oral, Daily   oxyCODONE (OXY IR/ROXICODONE) 5-10 mg, Oral, Every 6 hours PRN  oxyCODONE (OXY IR/ROXICODONE) 5 mg, Oral, Every 4 hours PRN   traMADol (ULTRAM) 50 MG tablet 1 po q 8 hrs prn pain   vitamin C 1,000 mg, Oral, Daily      Social History:  The patient  reports that he has quit smoking. His smoking use included cigarettes. He has a 10.00 pack-year smoking history. He has never used smokeless tobacco. He reports current alcohol use. He reports that he does not use drugs.   Family History:  *** include only if pertinent The patient's family history includes Hypertension in his father.***  ROS:  Please see the  history of present illness. All other systems are reviewed and otherwise negative.   PHYSICAL EXAM: *** VS:  There were no vitals taken for this visit. BMI: There is no height or weight on file to calculate BMI.  GEN- The patient is well appearing, alert and oriented x 3 today.   HEENT: normocephalic, atraumatic; sclera clear, conjunctiva pink; hearing intact; oropharynx clear; neck supple, no JVP Lungs- Clear to ausculation bilaterally, normal work of breathing.  No wheezes, rales, rhonchi Heart- {Blank single:19197::"Regular","Irregularly irregular"} rate and rhythm, no murmurs, rubs or gallops, PMI not laterally displaced GI- soft, non-tender, non-distended, bowel sounds present, no hepatosplenomegaly Extremities- {EDEMA LEVEL:28147::"No"} peripheral edema. no clubbing or cyanosis; DP/PT/radial pulses 2+ bilaterally MS- no significant deformity or atrophy Skin- warm and dry, no rash or lesion, *** device pocket well-healed Psych- euthymic mood, full affect Neuro- strength and sensation are intact   Device interrogation done today and reviewed by myself:  Battery *** Lead thresholds, impedence, sensing stable *** *** episodes *** changes made today  EKG is ordered. Personal review of EKG from today shows:  ***  Recent Labs: No results found for requested labs within last 365 days.  No results found for requested labs within last 365 days.   CrCl cannot be calculated (Patient's most recent lab result is older than the maximum 21 days allowed.).   Wt Readings from Last 3 Encounters:  04/16/21 278 lb (126.1 kg)  01/15/20 280 lb (127 kg)  11/13/19 280 lb (127 kg)     Additional studies reviewed include: Previous EP, cardiology notes.   TTE 05/08/2019  1. Left ventricular ejection fraction, by visual estimation, is 65 to 70%. The left ventricle has hyperdynamic function. There is severely increased left ventricular hypertrophy.   2. Left ventricular diastolic function could not  be evaluated.   3. Global right ventricle has normal systolic function.The right ventricular size is normal. No increase in right ventricular wall thickness.   4. Left atrial size was moderately dilated.   5. Right atrial size was normal.   6. The mitral valve is normal in structure. Mild mitral valve  regurgitation. No evidence of mitral stenosis.   7. The tricuspid valve is normal in structure. Tricuspid valve  regurgitation is not demonstrated.   8. The aortic valve is normal in structure. Aortic valve regurgitation is not visualized. No evidence of aortic valve sclerosis or stenosis.   9. The pulmonic valve was normal in structure. Pulmonic valve regurgitation is not visualized.  10. Upper normal size of the ascending aorta measuring 40 mm.  11. The inferior vena cava is normal in size with greater than 50% respiratory variability, suggesting right atrial pressure of 3 mmHg.  12. Normal pulmonary artery systolic pressure.   ASSESSMENT AND PLAN:  #) parox Afib  h/o Aflutter   CHA2DS2-VASc Score = 3 [CHF History: 0, HTN History: 1, Diabetes  History: 0, Stroke History: 0, Vascular Disease History: 0, Age Score: 2, Gender Score: 0].  Therefore, the patient's annual risk of stroke is 3.2 %.   [place] - eliquis 2.'5mg'$  BID, dose appropriately reduced   {Confirm score is correct.  If not, click here to update score.  REFRESH note.  :1}     #) symptomatic bradycardia s/p PPM   #) HTN    Current medicines are reviewed at length with the patient today.   The patient {ACTIONS; HAS/DOES NOT HAVE:19233} concerns regarding his medicines.  The following changes were made today:  {NONE DEFAULTED:18576}  Labs/ tests ordered today include: *** No orders of the defined types were placed in this encounter.    Disposition: Follow up with {EPMDS:28135} in {EPFOLLOW UP:28173} to establish care   Signed, Mamie Levers, NP  07/21/22  8:27 PM  Electrophysiology [facility]

## 2022-07-22 ENCOUNTER — Ambulatory Visit: Payer: Medicare Other | Admitting: Student

## 2022-07-22 DIAGNOSIS — I10 Essential (primary) hypertension: Secondary | ICD-10-CM

## 2022-07-22 DIAGNOSIS — I480 Paroxysmal atrial fibrillation: Secondary | ICD-10-CM

## 2022-07-22 DIAGNOSIS — R001 Bradycardia, unspecified: Secondary | ICD-10-CM

## 2022-07-22 DIAGNOSIS — Z950 Presence of cardiac pacemaker: Secondary | ICD-10-CM

## 2022-08-03 ENCOUNTER — Other Ambulatory Visit: Payer: Self-pay | Admitting: Physician Assistant

## 2022-08-04 NOTE — Telephone Encounter (Addendum)
Eliquis 2.'5mg'$  refill request received. Patient is 82 years old, weight-126.1kg, Crea-1.64 on 04/16/21-needs labs, Diagnosis-Afib, and last seen by Tommye Standard on 04/16/21 & NO SHOW ON 07/22/22. Dose is appropriate based on dosing criteria.   Pt needs an appt with Cardiologist & updated labs. Will send a message to schedulers for an appt.   Called pt without success and schedulers did as well on today.

## 2022-08-05 NOTE — Telephone Encounter (Addendum)
Spoke with pt and advised he needs an appointment with Cardiologist and that he missed his last one. Transferred pt to Scheduler line for an appointment. Made pt aware that he will need to adhere to appointment once made as he is overdue.   Eliquis 2.'5mg'$  refill request received. Patient is 82 years old, weight-126.1kg, Crea-1.64 on 04/16/21-needs labs, Diagnosis-Afib, and last seen by Tommye Standard on 04/16/21 & NO SHOW ON 07/22/22, transferred to [place] and now has appointment on 08/12/22. Dose is appropriate based on dosing criteria. Will send refill.

## 2022-08-08 NOTE — Progress Notes (Signed)
Error. No show.

## 2022-08-12 ENCOUNTER — Ambulatory Visit: Payer: Medicare Other | Attending: Student | Admitting: Physician Assistant

## 2022-08-12 ENCOUNTER — Encounter: Payer: Self-pay | Admitting: Physician Assistant

## 2022-08-12 VITALS — BP 117/66 | Ht 76.0 in | Wt 232.0 lb

## 2022-08-12 DIAGNOSIS — Z79899 Other long term (current) drug therapy: Secondary | ICD-10-CM | POA: Diagnosis not present

## 2022-08-12 DIAGNOSIS — D6869 Other thrombophilia: Secondary | ICD-10-CM

## 2022-08-12 DIAGNOSIS — Z950 Presence of cardiac pacemaker: Secondary | ICD-10-CM

## 2022-08-12 DIAGNOSIS — I4819 Other persistent atrial fibrillation: Secondary | ICD-10-CM | POA: Diagnosis not present

## 2022-08-12 NOTE — Progress Notes (Signed)
Cardiology Office Note Date:  08/12/2022  Patient ID:  [name] [name]., DOB [birth year], MRN [id number] PCP:  Crist Infante, MD  Cardiologist:  Dr. Gardiner Rhyme Electrophysiologist: Dr. Rayann Heman    Chief Complaint:   6 mo   History of Present Illness: [name] [name]. is a 82 y.o. male with history of AFlutter (ablated) > Afib, symptomatic bradycardia w/PPM, HTN  He comes in today to be seen for Dr. Rayann Heman, last seen by him at the time of his PPM implant Aug 2021.  I saw him 04/16/21 He feels "OK" Denies any CP, palpitations or cardiac awareness He is frustrated with the cost of Eliquis and want to stop it He thought the pacer took care of his Afib and wonders why he still needs a blood thinner No near syncope or syncope No bleeding or signs of bleeding Eliquis was financially difficult, discussed options, he was going to try and work with pt assistance  He no-showed to his 07/22/22 appt.  TODAY He is accompanied by his son. He is doing well "for an old man" Denies CP, palpitations or cardiac awareness. No SOB No near syncope or syncope. No bleeding or signs of bleeding   Device information MDT Micra AV implanted 01/15/2020  AFib/flutter Hx CTI ablation 05/04/2012 Afib found 2020  AAD hx None to date   Past Medical History:  Diagnosis Date   Atrial flutter [facility])    s/p CTI ablation by Dr Lovena Le 2013   Calculus of kidney    Coronary atherosclerosis of unspecified type of vessel, native or graft    Degeneration of cervical intervertebral disc    Depression    DJD (degenerative joint disease)    GERD (gastroesophageal reflux disease)    HTN (hypertension)    Impotence of organic origin    Inguinal hernia without mention of obstruction or gangrene, unilateral or unspecified, (not specified as recurrent)    Insomnia, unspecified    Intestinal infection due to Clostridium difficile    Irritable bowel syndrome    Kidney stone    "they just passed" (05/03/2012)    Obesity, unspecified    Partial deafness    Sacroiliitis, not elsewhere classified ([place])    Unspecified hereditary and idiopathic peripheral neuropathy     Past Surgical History:  Procedure Laterality Date   APPENDECTOMY  ~ [place] N/A 05/03/2012   CTI ablation by Dr Lovena Le in 2013   [place]  08/1999; 05/03/2012   normal coronary arteries;    CARPAL TUNNEL WITH CUBITAL TUNNEL  2004   "right" (05/03/2012)   COLONOSCOPY  06/06/2008   severe sigmoid diverticulosis and internal hemorrhoids   ESOPHAGOGASTRODUODENOSCOPY  06/06/2008   normal   INGUINAL HERNIA REPAIR  03/23/11   "left" (05/03/2012)   PACEMAKER LEADLESS INSERTION N/A 01/15/2020   Procedure: PACEMAKER LEADLESS INSERTION;  Surgeon: Thompson Grayer, MD;  Location: [place] [facility];  Service: Cardiovascular;  Laterality: N/A;   POSTERIOR LUMBAR FUSION  1989   reconstruction (r) foot surgery     DR SUE    RENAL CYST EXCISION     pt denies this hx on 05/03/2012   RIGHT [place]   Pantopaque Myelography and spinal infusion- Dr. Lyman Speller   TONSILLECTOMY  ~ [place]  1999-2000   bilateral    Current Outpatient Medications  Medication Sig Dispense Refill   amLODipine (NORVASC) 10 MG tablet Take  10 mg by mouth daily.     apixaban (ELIQUIS) 2.5 MG TABS tablet Take 1 tablet (2.5 mg total) by mouth 2 (two) times daily. Please keep upcoming appointment on 08/12/22 60 tablet 0   Ascorbic Acid (VITAMIN C) 1000 MG tablet Take 1,000 mg by mouth daily.     losartan (COZAAR) 100 MG tablet TAKE 1 TABLET BY MOUTH EVERY DAY 90 tablet 2   oxyCODONE (OXY IR/ROXICODONE) 5 MG immediate release tablet Take 5-10 mg by mouth every 6 (six) hours as needed for severe pain.      oxyCODONE (OXY IR/ROXICODONE) 5 MG immediate release tablet Take 1 tablet (5 mg total) by mouth every 4 (four) hours as needed for severe pain. 45 tablet 0   traMADol (ULTRAM) 50 MG tablet 1 po q 8  hrs prn pain 30 tablet 0   No current facility-administered medications for this visit.    Allergies:   Amitriptyline, Azithromycin, and Flexeril [cyclobenzaprine]   Social History:  The patient  reports that he has quit smoking. His smoking use included cigarettes. He has a 10.00 pack-year smoking history. He has never used smokeless tobacco. He reports current alcohol use. He reports that he does not use drugs.   Family History:  The patient's family history includes Hypertension in his father.  ROS:  Please see the history of present illness.    All other systems are reviewed and otherwise negative.   PHYSICAL EXAM:  VS:  There were no vitals taken for this visit. BMI: There is no height or weight on file to calculate BMI. Well nourished, well developed, in no acute distress HEENT: normocephalic, atraumatic Neck: no JVD, carotid bruits or masses Cardiac:  RRR; no significant murmurs, no rubs, or gallops Lungs:  CTA b/l, no wheezing, rhonchi or rales Abd: soft, nontender, obese MS: no deformity or atrophy Ext:  no edema Skin: warm and dry, no rash Neuro:  No gross deficits appreciated Psych: euthymic mood, full affect   EKG:  Done today and reviewed by myself shows  SR 71bpm, 1st degree AVblock 335m, on V paced beat  Device interrogation done today and reviewed by myself:  Battery and electrode measurements are good AM/VP 49.4% VP only 46.8%  Noting undersensing of P waves With industry assistance:  VDD > VDI Sensing vector 1+2, A3 window 6279mA4 threshold 0.8 Auto A3 off Auto A3 window end off This resulted in less asynchronous pacing, allowing intrinsic conduction Base pacing rate left at 50  05/08/2019: TTE 1. Left ventricular ejection fraction, by visual estimation, is 65 to  70%. The left ventricle has hyperdynamic function. There is severely  increased left ventricular hypertrophy.   2. Left ventricular diastolic function could not be evaluated.   3.  Global right ventricle has normal systolic function.The right  ventricular size is normal. No increase in right ventricular wall  thickness.   4. Left atrial size was moderately dilated.   5. Right atrial size was normal.   6. The mitral valve is normal in structure. Mild mitral valve  regurgitation. No evidence of mitral stenosis.   7. The tricuspid valve is normal in structure. Tricuspid valve  regurgitation is not demonstrated.   8. The aortic valve is normal in structure. Aortic valve regurgitation is  not visualized. No evidence of aortic valve sclerosis or stenosis.   9. The pulmonic valve was normal in structure. Pulmonic valve  regurgitation is not visualized.  10. Upper normal size of the ascending aorta measuring 40  mm.  11. The inferior vena cava is normal in size with greater than 50%  respiratory variability, suggesting right atrial pressure of 3 mmHg.  12. Normal pulmonary artery systolic pressure.   Recent Labs: No results found for requested labs within last 365 days.  No results found for requested labs within last 365 days.   CrCl cannot be calculated (Patient's most recent lab result is older than the maximum 21 days allowed.).   Wt Readings from Last 3 Encounters:  04/16/21 278 lb (126.1 kg)  01/15/20 280 lb (127 kg)  11/13/19 280 lb (127 kg)     Other studies reviewed: Additional studies/records reviewed today include: summarized above  ASSESSMENT AND PLAN:  PPM Intact function Programming changes as above  persistent AFib CHA2DS2Vasc is 3, on Eliquis Unclear/unknown burden, no symptoms Labs today for his eliquis  HTN looks OK   4. Secondary hypercoagulable state   Disposition: F/u with remotes as usual, in clinic in 63mo sooner if needed.  He has the numbers/contact information for patient assistance staff member.  Current medicines are reviewed at length with the patient today.  The patient did not have any concerns regarding  medicines.  SVenetia Night PA-C 08/12/2022 7:05 AM     [place] [phone](office)  [phone](fax)

## 2022-08-12 NOTE — Patient Instructions (Addendum)
Medication Instructions:   Your physician recommends that you continue on your current medications as directed. Please refer to the Current Medication list given to you today.   *If you need a refill on your cardiac medications before your next appointment, please call your pharmacy*   Lab Work:  BMET AND CBC TODAY   If you have labs (blood work) drawn today and your tests are completely normal, you will receive your results only by: [place] (if you have MyChart) OR A paper copy in the mail If you have any lab test that is abnormal or we need to change your treatment, we will call you to review the results.   Testing/Procedures:  NONE ORDERED  TODAY    Follow-Up: At [facility], you and your health needs are our priority.  As part of our continuing mission to provide you with exceptional heart care, we have created designated Provider Care Teams.  These Care Teams include your primary Cardiologist (physician) and Advanced Practice Providers (APPs -  Physician Assistants and Nurse Practitioners) who all work together to provide you with the care you need, when you need it.  We recommend signing up for the patient portal called "MyChart".  Sign up information is provided on this After Visit Summary.  MyChart is used to connect with patients for Virtual Visits (Telemedicine).  Patients are able to view lab/test results, encounter notes, upcoming appointments, etc.  Non-urgent messages can be sent to your provider as well.   To learn more about what you can do with MyChart, go to [email or web address].    Your next appointment:   1 year(s)  Provider:   Allegra Lai, MD    Other Instructions

## 2022-08-12 NOTE — Progress Notes (Signed)
Remote pacemaker transmission.   

## 2022-08-13 ENCOUNTER — Other Ambulatory Visit: Payer: Self-pay | Admitting: *Deleted

## 2022-08-13 DIAGNOSIS — Z79899 Other long term (current) drug therapy: Secondary | ICD-10-CM

## 2022-08-13 LAB — CBC
Hematocrit: 41.9 % (ref 37.5–51.0)
Hemoglobin: 13.9 g/dL (ref 13.0–17.7)
MCH: 30.5 pg (ref 26.6–33.0)
MCHC: 33.2 g/dL (ref 31.5–35.7)
MCV: 92 fL (ref 79–97)
Platelets: 257 10*3/uL (ref 150–450)
RBC: 4.55 x10E6/uL (ref 4.14–5.80)
RDW: 13.1 % (ref 11.6–15.4)
WBC: 8.3 10*3/uL (ref 3.4–10.8)

## 2022-08-13 LAB — BASIC METABOLIC PANEL
BUN/Creatinine Ratio: 21 (ref 10–24)
BUN: 47 mg/dL — ABNORMAL HIGH (ref 8–27)
CO2: 22 mmol/L (ref 20–29)
Calcium: 10.1 mg/dL (ref 8.6–10.2)
Chloride: 100 mmol/L (ref 96–106)
Creatinine, Ser: 2.22 mg/dL — ABNORMAL HIGH (ref 0.76–1.27)
Glucose: 90 mg/dL (ref 70–99)
Potassium: 4.5 mmol/L (ref 3.5–5.2)
Sodium: 139 mmol/L (ref 134–144)
eGFR: 29 mL/min/{1.73_m2} — ABNORMAL LOW (ref >59)

## 2022-08-13 LAB — CUP PACEART INCLINIC DEVICE CHECK
Date Time Interrogation Session: 20240314124157
Implantable Pulse Generator Implant Date: 20210817

## 2022-08-27 ENCOUNTER — Ambulatory Visit: Payer: Medicare Other | Attending: Physician Assistant

## 2022-08-27 DIAGNOSIS — Z79899 Other long term (current) drug therapy: Secondary | ICD-10-CM

## 2022-08-28 LAB — BASIC METABOLIC PANEL
BUN/Creatinine Ratio: 13 (ref 10–24)
BUN: 23 mg/dL (ref 8–27)
CO2: 22 mmol/L (ref 20–29)
Calcium: 9.2 mg/dL (ref 8.6–10.2)
Chloride: 105 mmol/L (ref 96–106)
Creatinine, Ser: 1.8 mg/dL — ABNORMAL HIGH (ref 0.76–1.27)
Glucose: 92 mg/dL (ref 70–99)
Potassium: 4.7 mmol/L (ref 3.5–5.2)
Sodium: 141 mmol/L (ref 134–144)
eGFR: 37 mL/min/{1.73_m2} — ABNORMAL LOW (ref >59)

## 2022-09-07 ENCOUNTER — Telehealth: Payer: Self-pay | Admitting: Cardiology

## 2022-09-07 NOTE — Telephone Encounter (Signed)
*  STAT* If patient is at the pharmacy, call can be transferred to refill team.   1. Which medications need to be refilled? (please list name of each medication and dose if known) apixaban (ELIQUIS) 2.5 MG TABS tablet   2. Which pharmacy/location (including street and city if local pharmacy) is medication to be sent to? [id number] [place], [place] - [address]   3. Do they need a 30 day or 90 day supply? 90 .

## 2022-09-08 MED ORDER — APIXABAN 2.5 MG PO TABS: 2.5000 mg | ORAL_TABLET | Freq: Two times a day (BID) | ORAL | 1 refills | Status: DC

## 2022-09-08 NOTE — Telephone Encounter (Signed)
Pt last saw Francis Dowse, [place] on 08/12/22, last labs 08/27/22 Creat 1.80, age 82, weight 105.2kg, based on specified criteria pt is on appropriate dosage of Eliquis 2.5mg  BID for afib.  Will refill rx.

## 2022-09-08 NOTE — Telephone Encounter (Signed)
Pt calling to check on status of refill being sent to pharmacy.   Pt has been out of medication for 5 days he stated.

## 2022-09-10 ENCOUNTER — Other Ambulatory Visit: Payer: Self-pay | Admitting: *Deleted

## 2022-09-10 DIAGNOSIS — Z79899 Other long term (current) drug therapy: Secondary | ICD-10-CM

## 2022-09-10 MED ORDER — LOSARTAN POTASSIUM 100 MG PO TABS: 50.0000 mg | ORAL_TABLET | Freq: Every day | ORAL | 3 refills | Status: DC

## 2022-09-21 ENCOUNTER — Ambulatory Visit: Payer: Medicare Other | Admitting: Orthopedic Surgery

## 2022-10-06 ENCOUNTER — Ambulatory Visit: Payer: Medicare Other | Admitting: Podiatry

## 2022-10-11 ENCOUNTER — Ambulatory Visit: Payer: Medicare Other

## 2022-10-11 ENCOUNTER — Ambulatory Visit: Payer: Medicare Other | Admitting: Orthopedic Surgery

## 2022-10-12 ENCOUNTER — Ambulatory Visit: Payer: Medicare Other | Attending: Physician Assistant

## 2022-10-13 ENCOUNTER — Ambulatory Visit (INDEPENDENT_AMBULATORY_CARE_PROVIDER_SITE_OTHER): Payer: Medicare Other

## 2022-10-13 DIAGNOSIS — I441 Atrioventricular block, second degree: Secondary | ICD-10-CM | POA: Diagnosis not present

## 2022-10-15 ENCOUNTER — Other Ambulatory Visit: Payer: Medicare Other

## 2022-10-15 ENCOUNTER — Telehealth: Payer: Self-pay | Admitting: Cardiology

## 2022-10-15 ENCOUNTER — Other Ambulatory Visit: Payer: Self-pay

## 2022-10-15 DIAGNOSIS — Z79899 Other long term (current) drug therapy: Secondary | ICD-10-CM

## 2022-10-15 DIAGNOSIS — I10 Essential (primary) hypertension: Secondary | ICD-10-CM

## 2022-10-15 NOTE — Telephone Encounter (Signed)
Spoke to patient he stated he was returning a call from someone in our office.He does not know their name.After reviewing chart he stated he was never told about lab done 08/27/22.Advised his wife was notified of results 4/12.Stated she does not remember call.Advised He is suppose to decrease Losartan to 50 mg daily.Stated he has been taking Losartan 100 mg 1/2 tablet daily.He was suppose to have a repeat bmet in 4 weeks.He will go to [employer] on Mon 5/20 to have done.He will start checking B/P daily.Advised to call back if systolic greater than 160 and diastolic greater than 90.

## 2022-10-15 NOTE — Telephone Encounter (Signed)
Patient is calling stating he received a call from the office this morning. Was unable to find documentation of who called.  Please advise.

## 2022-10-18 ENCOUNTER — Ambulatory Visit: Payer: Medicare Other

## 2022-10-18 LAB — CUP PACEART REMOTE DEVICE CHECK
Battery Remaining Longevity: 89 mo
Battery Voltage: 2.99 V
Brady Statistic AS VP Percent: 81.76 %
Brady Statistic AS VS Percent: 18.11 %
Brady Statistic RV Percent Paced: 81.85 %
Date Time Interrogation Session: 20240520124349
Implantable Pulse Generator Implant Date: 20210817
Lead Channel Impedance Value: 550 Ohm
Lead Channel Pacing Threshold Amplitude: 0.875 V
Lead Channel Pacing Threshold Pulse Width: 0.24 ms
Lead Channel Sensing Intrinsic Amplitude: 12.15 mV
Lead Channel Setting Pacing Amplitude: 1.5 V
Lead Channel Setting Pacing Pulse Width: 0.24 ms
Lead Channel Setting Sensing Sensitivity: 2 mV

## 2022-10-19 ENCOUNTER — Ambulatory Visit: Payer: Medicare Other | Attending: Physician Assistant

## 2022-10-27 ENCOUNTER — Ambulatory Visit: Payer: Medicare Other | Attending: Physician Assistant

## 2022-10-28 NOTE — Progress Notes (Signed)
Remote pacemaker transmission.   

## 2022-11-01 ENCOUNTER — Ambulatory Visit: Payer: Medicare Other | Attending: Physician Assistant

## 2022-11-01 DIAGNOSIS — I10 Essential (primary) hypertension: Secondary | ICD-10-CM

## 2022-11-01 LAB — BASIC METABOLIC PANEL
BUN/Creatinine Ratio: 17 (ref 10–24)
BUN: 29 mg/dL — ABNORMAL HIGH (ref 8–27)
CO2: 23 mmol/L (ref 20–29)
Calcium: 8.9 mg/dL (ref 8.6–10.2)
Chloride: 106 mmol/L (ref 96–106)
Creatinine, Ser: 1.7 mg/dL — ABNORMAL HIGH (ref 0.76–1.27)
Glucose: 82 mg/dL (ref 70–99)
Potassium: 4.1 mmol/L (ref 3.5–5.2)
Sodium: 141 mmol/L (ref 134–144)
eGFR: 40 mL/min/{1.73_m2} — ABNORMAL LOW (ref >59)

## 2022-11-02 ENCOUNTER — Telehealth: Payer: Self-pay | Admitting: Physician Assistant

## 2022-11-02 NOTE — Telephone Encounter (Signed)
Follow Up:     Patiemt says he would like his lab results from yesterday(11-01-22) please. He says he does not have My-Chart.

## 2022-11-02 NOTE — Telephone Encounter (Signed)
Spoke with the patient and reviewed lab results. Advised that these had not been reviewed by [facility] and that we would call him back once she does if there are any recommendations from her based on those results.

## 2022-11-08 ENCOUNTER — Telehealth: Payer: Self-pay | Admitting: Physician Assistant

## 2022-11-08 NOTE — Telephone Encounter (Signed)
Pt is returning call for lab results, he states his vm isn't working that well

## 2022-11-11 ENCOUNTER — Telehealth: Payer: Self-pay | Admitting: Physician Assistant

## 2022-11-11 MED ORDER — LOSARTAN POTASSIUM 50 MG PO TABS: 50.0000 mg | ORAL_TABLET | Freq: Every day | ORAL | 2 refills | Status: AC

## 2022-11-11 NOTE — Telephone Encounter (Signed)
Returned call to patient. Reviewed current medication list with patient.  Patient states he has losartan-hctz 100-25mg  tablets available. Informed patient that medication was discontinued a while back and he should be taking losartan 50mg  daily. [facility] [place]  Patient states he will monitor BP over the next week and send [place] a list of readings to review.  Patient asked if we could refill his amitryptilline. Informed patient this is not on his medication and not prescribed by our office. Patient asked if he is supposed to still take this medication. Advised patient to contact Dr. Laurey Morale office (prescriber) to ask if he is to continue this medication and if so request refills from their office.  Patient requested to schedule F/U appt, scheduled to see Francis Dowse, PA-C on 02/21/23.  Patient also asked if he could be prescribed Viagra to take as needed. Will forward message to [facility] to review and advise.  Patient verbalized understanding and expressed appreciation for call.

## 2022-11-11 NOTE — Telephone Encounter (Signed)
Pt c/o medication issue:  1. Name of Medication: Losartan  2. How are you currently taking this medication (dosage and times per day)? ?????  3. Are you having a reaction (difficulty breathing--STAT)?   4. What is your medication issue? Primary doctor telling him1 tabley and here is saying 1/2 tablet, which is correct

## 2022-11-19 NOTE — Telephone Encounter (Signed)
Returned call to patient and shared Renee's response:  I don't see any cardiac reason that he could not/should not use Viagra.  Defer to his PMD evaluation and management/prescribing.    Patient expressed dissatisfaction wit his current PCP and states he will be looking for a new one.  I asked patient if he had some recent BP readings and he states his BP has been fine. He reports lower BP in the morning (about 1-2 hours after taking medications) and higher in the PM (around 140/70). Patient did not have any actual readings to give at time of call. Patient kept call short.  Patient verbalized understanding regarding reaching out to PCP to discuss prescribing Viagra. Confirmed 6 month F/U appt with Renee on 02/21/23. Patient expressed appreciation for call.

## 2022-11-29 ENCOUNTER — Ambulatory Visit: Payer: Medicare Other | Admitting: Orthopedic Surgery

## 2022-11-29 DIAGNOSIS — M1711 Unilateral primary osteoarthritis, right knee: Secondary | ICD-10-CM | POA: Diagnosis not present

## 2022-11-29 DIAGNOSIS — M170 Bilateral primary osteoarthritis of knee: Secondary | ICD-10-CM

## 2022-11-30 ENCOUNTER — Encounter: Payer: Self-pay | Admitting: Orthopedic Surgery

## 2022-11-30 NOTE — Progress Notes (Unsigned)
Office Visit Note   Patient: [name] [name].           Date of Birth: [birth year]           MRN: [id number] Visit Date: 11/29/2022 Requested by: Rodrigo Ran, MD [address] [place],  [place] [address] PCP: Rodrigo Ran, MD  Subjective: Chief Complaint  Patient presents with  . Right Knee - Pain    HPI: Kinsey Rupani. is a 82 y.o. male who presents to the office reporting ***.                ROS: All systems reviewed are negative as they relate to the chief complaint within the history of present illness.  Patient denies fevers or chills.  Assessment & Plan: Visit Diagnoses:  1. Primary osteoarthritis of both knees     Plan: ***  Follow-Up Instructions: No follow-ups on file.   Orders:  No orders of the defined types were placed in this encounter.  No orders of the defined types were placed in this encounter.     Procedures: Large Joint Inj: R knee on 11/29/2022 9:50 PM Indications: diagnostic evaluation, joint swelling and pain Details: 18 G 1.5 in needle, superolateral approach  Arthrogram: No  Medications: 5 mL lidocaine 1 %; 40 mg methylPREDNISolone acetate 40 MG/ML; 4 mL bupivacaine 0.25 % Outcome: tolerated well, no immediate complications Procedure, treatment alternatives, risks and benefits explained, specific risks discussed. Consent was given by the patient. Immediately prior to procedure a time out was called to verify the correct patient, procedure, equipment, support staff and site/side marked as required. Patient was prepped and draped in the usual sterile fashion.     Clinical Data: No additional findings.  Objective: Vital Signs: There were no vitals taken for this visit.  Physical Exam:  Constitutional: Patient appears well-developed HEENT:  Head: Normocephalic Eyes:EOM are normal Neck: Normal range of motion Cardiovascular: Normal rate Pulmonary/chest: Effort normal Neurologic: Patient is alert Skin: Skin is warm Psychiatric:  Patient has normal mood and affect  Ortho Exam: ***  Specialty Comments:  No specialty comments available.  Imaging: No results found.   PMFS History: Patient Active Problem List   Diagnosis Date Noted  . Paroxysmal atrial fibrillation (HCC) 07/21/2022  . Hypercoagulable state due to paroxysmal atrial fibrillation (HCC) 07/21/2022  . Symptomatic bradycardia 07/21/2022  . Presence of leadless cardiac pacemaker 07/21/2022  . Degeneration of lumbar intervertebral disc 12/04/2018  . Trochanteric bursitis of left hip 12/04/2018  . Seborrheic keratosis 08/17/2016  . Sprain of ankle 05/12/2016  . Rash and nonspecific skin eruption 01/06/2016  . Pain in both knees 11/25/2015  . Myalgia and myositis 11/25/2015  . Weak 11/25/2015  . Insomnia 09/03/2015  . Frequency of urination 05/27/2015  . Post herpetic Neuropathy 03/12/2015  . Depression, major, recurrent, moderate (HCC) 03/12/2015  . Stress at home 01/26/2015  . Pain in left foot 06/26/2014  . Corn of toe 01/09/2014  . Allergic rhinitis 10/01/2013  . Rotator cuff tear arthropathy of right shoulder 10/01/2013  . Pain in joint, shoulder region 01/02/2013  . HTN (hypertension)   . Partial deafness   . Obesity   . Hammertoe 10/04/2012  . CKD (chronic kidney disease) 03/22/2012  . Chronic constipation 11/13/2010   Past Medical History:  Diagnosis Date  . Atrial flutter [facility])    s/p CTI ablation by Dr Ladona Ridgel 2013  . Calculus of kidney   . Coronary atherosclerosis of unspecified type of vessel, native or  graft   . Degeneration of cervical intervertebral disc   . Depression   . DJD (degenerative joint disease)   . GERD (gastroesophageal reflux disease)   . HTN (hypertension)   . Impotence of organic origin   . Inguinal hernia without mention of obstruction or gangrene, unilateral or unspecified, (not specified as recurrent)   . Insomnia, unspecified   . Intestinal infection due to Clostridium difficile   . Irritable bowel  syndrome   . Kidney stone    "they just passed" (05/03/2012)  . Obesity, unspecified   . Partial deafness   . Sacroiliitis, not elsewhere classified (HCC)   . Unspecified hereditary and idiopathic peripheral neuropathy     Family History  Problem Relation Age of Onset  . Hypertension Father   . Colon cancer Neg Hx     Past Surgical History:  Procedure Laterality Date  . APPENDECTOMY  ~ 1956  . ATRIAL FLUTTER ABLATION N/A 05/03/2012   CTI ablation by Dr Ladona Ridgel in 2013  . CARDIAC CATHETERIZATION  08/1999; 05/03/2012   normal coronary arteries;   . CARPAL TUNNEL WITH CUBITAL TUNNEL  2004   "right" (05/03/2012)  . COLONOSCOPY  06/06/2008   severe sigmoid diverticulosis and internal hemorrhoids  . ESOPHAGOGASTRODUODENOSCOPY  06/06/2008   normal  . INGUINAL HERNIA REPAIR  03/23/11   "left" (05/03/2012)  . PACEMAKER LEADLESS INSERTION N/A 01/15/2020   Procedure: PACEMAKER LEADLESS INSERTION;  Surgeon: Hillis Range, MD;  Location: [facility];  Service: Cardiovascular;  Laterality: N/A;  . POSTERIOR LUMBAR FUSION  1989  . reconstruction (r) foot surgery     DR SUE   . RENAL CYST EXCISION     pt denies this hx on 05/03/2012  . RIGHT KNEE  1961 & 1963  . SPINE SURGERY  1969   Pantopaque Myelography and spinal infusion- Dr. Eligha Bridegroom  . TONSILLECTOMY  ~ 1946  . TOTAL HIP ARTHROPLASTY  1999-2000   bilateral   Social History   Occupational History  . Not on file  Tobacco Use  . Smoking status: Former    Packs/day: 2.00    Years: 5.00    Additional pack years: 0.00    Total pack years: 10.00    Types: Cigarettes  . Smokeless tobacco: Never  . Tobacco comments:    05/03/2012 "smoked from age 5 to 80"  Vaping Use  . Vaping Use: Never used  Substance and Sexual Activity  . Alcohol use: Yes    Comment: "1 margaritia a week"  . Drug use: No  . Sexual activity: Yes    Partners: Female

## 2022-12-01 MED ORDER — LIDOCAINE HCL 1 % IJ SOLN
5.0000 mL | INTRAMUSCULAR | Status: AC | PRN
  Administered 2022-11-29: 5 mL

## 2022-12-01 MED ORDER — METHYLPREDNISOLONE ACETATE 40 MG/ML IJ SUSP
40.0000 mg | INTRAMUSCULAR | Status: AC | PRN
  Administered 2022-11-29: 40 mg via INTRA_ARTICULAR

## 2022-12-01 MED ORDER — BUPIVACAINE HCL 0.25 % IJ SOLN
4.0000 mL | INTRAMUSCULAR | Status: AC | PRN
  Administered 2022-11-29: 4 mL via INTRA_ARTICULAR

## 2023-01-12 ENCOUNTER — Ambulatory Visit: Payer: Medicare Other

## 2023-02-10 ENCOUNTER — Other Ambulatory Visit: Payer: Self-pay | Admitting: Cardiology

## 2023-02-10 DIAGNOSIS — I480 Paroxysmal atrial fibrillation: Secondary | ICD-10-CM

## 2023-02-10 NOTE — Telephone Encounter (Signed)
Eliquis 2.5mg  refill request received. Patient is 82 years old, weight-105.2kg, Crea-1.70 on 11/01/22, Diagnosis-Afib, and last seen by Francis Dowse on 08/12/22. Dose is appropriate based on dosing criteria. Will send in refill to requested pharmacy.

## 2023-02-15 ENCOUNTER — Ambulatory Visit: Payer: Medicare Other

## 2023-02-15 DIAGNOSIS — I441 Atrioventricular block, second degree: Secondary | ICD-10-CM | POA: Diagnosis not present

## 2023-02-15 LAB — CUP PACEART REMOTE DEVICE CHECK
Battery Remaining Longevity: 87 mo
Battery Voltage: 2.99 V
Brady Statistic AS VP Percent: 84.71 %
Brady Statistic AS VS Percent: 15.19 %
Brady Statistic RV Percent Paced: 84.79 %
Date Time Interrogation Session: 20240917074449
Implantable Pulse Generator Implant Date: 20210817
Lead Channel Impedance Value: 510 Ohm
Lead Channel Pacing Threshold Amplitude: 0.75 V
Lead Channel Pacing Threshold Pulse Width: 0.24 ms
Lead Channel Sensing Intrinsic Amplitude: 9.9 mV
Lead Channel Setting Pacing Amplitude: 1.375
Lead Channel Setting Pacing Pulse Width: 0.24 ms
Lead Channel Setting Sensing Sensitivity: 2 mV

## 2023-02-19 NOTE — Progress Notes (Deleted)
Cardiology Office Note Date:  02/19/2023  Patient ID:  [name] [name]., DOB 09/03/1940, MRN [id number] PCP:  Rodrigo Ran, MD  Cardiologist:  Dr. Bjorn Pippin Electrophysiologist: Dr. Johney Frame    Chief Complaint:   *** 6 mo   History of Present Illness: [name] [name]. is a 82 y.o. male with history of AFlutter (ablated) > Afib, symptomatic bradycardia w/PPM, HTN  He comes in today to be seen for Dr. Johney Frame, last seen by him at the time of his PPM implant Aug 2021.  I saw him 04/16/21 He feels "OK" Denies any CP, palpitations or cardiac awareness He is frustrated with the cost of Eliquis and want to stop it He thought the pacer took care of his Afib and wonders why he still needs a blood thinner No near syncope or syncope No bleeding or signs of bleeding Eliquis was financially difficult, discussed options, he was going to try and work with pt assistance  He no-showed to his 07/22/22 appt.  I saw him 08/12/22 He is accompanied by his son. He is doing well "for an old man" Noted: P wave undersensing W/industry  VDD > VDI Sensing vector 1+2, A3 window A4 threshold 0.8 Auto A3 off Auto A3 window end off This resulted in less asynchronous pacing, allowing intrinsic conduction  *** eliquis, dose, labs, bleeding *** rates *** new EP, WC is reading   Device information MDT Micra AV implanted 01/15/2020  AFib/flutter Hx CTI ablation 05/04/2012 Afib found 2020  AAD hx None to date   Past Medical History:  Diagnosis Date   Atrial flutter (HCC)    s/p CTI ablation by Dr Ladona Ridgel 2013   Calculus of kidney    Coronary atherosclerosis of unspecified type of vessel, native or graft    Degeneration of cervical intervertebral disc    Depression    DJD (degenerative joint disease)    GERD (gastroesophageal reflux disease)    HTN (hypertension)    Impotence of organic origin    Inguinal hernia without mention of obstruction or gangrene, unilateral or unspecified, (not  specified as recurrent)    Insomnia, unspecified    Intestinal infection due to Clostridium difficile    Irritable bowel syndrome    Kidney stone    "they just passed" (05/03/2012)   Obesity, unspecified    Partial deafness    Sacroiliitis, not elsewhere classified (HCC)    Unspecified hereditary and idiopathic peripheral neuropathy     Past Surgical History:  Procedure Laterality Date   APPENDECTOMY  ~ 1956   ATRIAL FLUTTER ABLATION N/A 05/03/2012   CTI ablation by Dr Ladona Ridgel in 2013   CARDIAC CATHETERIZATION  08/1999; 05/03/2012   normal coronary arteries;    CARPAL TUNNEL WITH CUBITAL TUNNEL  2004   "right" (05/03/2012)   COLONOSCOPY  06/06/2008   severe sigmoid diverticulosis and internal hemorrhoids   ESOPHAGOGASTRODUODENOSCOPY  06/06/2008   normal   INGUINAL HERNIA REPAIR  03/23/11   "left" (05/03/2012)   PACEMAKER LEADLESS INSERTION N/A 01/15/2020   Procedure: PACEMAKER LEADLESS INSERTION;  Surgeon: Hillis Range, MD;  Location: [facility];  Service: Cardiovascular;  Laterality: N/A;   POSTERIOR LUMBAR FUSION  1989   reconstruction (r) foot surgery     DR SUE    RENAL CYST EXCISION     pt denies this hx on 05/03/2012   RIGHT KNEE  1961 & 1963   SPINE SURGERY  1969   Pantopaque Myelography and spinal infusion- Dr. Eligha Bridegroom  TONSILLECTOMY  ~ 1946   TOTAL HIP ARTHROPLASTY  1999-2000   bilateral    Current Outpatient Medications  Medication Sig Dispense Refill   amLODipine (NORVASC) 10 MG tablet Take 10 mg by mouth daily.     Ascorbic Acid (VITAMIN C) 1000 MG tablet Take 1,000 mg by mouth daily.     ELIQUIS 2.5 MG TABS tablet TAKE 1 TABLET BY MOUTH TWICE A DAY 180 tablet 1   gabapentin (NEURONTIN) 100 MG capsule in the morning and at bedtime.     losartan (COZAAR) 50 MG tablet Take 1 tablet (50 mg total) by mouth daily. 90 tablet 2   oxyCODONE-acetaminophen (PERCOCET) 7.5-325 MG tablet Take 1-2 tablets by mouth every 6 (six) hours as needed for severe pain.     No  current facility-administered medications for this visit.    Allergies:   Amitriptyline, Duloxetine hcl, Escitalopram, Nebivolol hcl, Azithromycin, and Flexeril [cyclobenzaprine]   Social History:  The patient  reports that he has quit smoking. His smoking use included cigarettes. He has a 10 pack-year smoking history. He has never used smokeless tobacco. He reports current alcohol use. He reports that he does not use drugs.   Family History:  The patient's family history includes Hypertension in his father.  ROS:  Please see the history of present illness.    All other systems are reviewed and otherwise negative.   PHYSICAL EXAM:  VS:  There were no vitals taken for this visit. BMI: There is no height or weight on file to calculate BMI. Well nourished, well developed, in no acute distress HEENT: normocephalic, atraumatic Neck: no JVD, carotid bruits or masses Cardiac: *** RRR; no significant murmurs, no rubs, or gallops Lungs: *** CTA b/l, no wheezing, rhonchi or rales Abd: soft, nontender, obese MS: no deformity or atrophy Ext: *** no edema Skin: warm and dry, no rash Neuro:  No gross deficits appreciated Psych: euthymic mood, full affect   EKG:  not done today  Device interrogation done today and reviewed by myself:  Battery and electrode measurements are good ***  05/08/2019: TTE 1. Left ventricular ejection fraction, by visual estimation, is 65 to  70%. The left ventricle has hyperdynamic function. There is severely  increased left ventricular hypertrophy.   2. Left ventricular diastolic function could not be evaluated.   3. Global right ventricle has normal systolic function.The right  ventricular size is normal. No increase in right ventricular wall  thickness.   4. Left atrial size was moderately dilated.   5. Right atrial size was normal.   6. The mitral valve is normal in structure. Mild mitral valve  regurgitation. No evidence of mitral stenosis.   7. The  tricuspid valve is normal in structure. Tricuspid valve  regurgitation is not demonstrated.   8. The aortic valve is normal in structure. Aortic valve regurgitation is  not visualized. No evidence of aortic valve sclerosis or stenosis.   9. The pulmonic valve was normal in structure. Pulmonic valve  regurgitation is not visualized.  10. Upper normal size of the ascending aorta measuring 40 mm.  11. The inferior vena cava is normal in size with greater than 50%  respiratory variability, suggesting right atrial pressure of 3 mmHg.  12. Normal pulmonary artery systolic pressure.   Recent Labs: 08/12/2022: Hemoglobin 13.9; Platelets 257 11/01/2022: BUN 29; Creatinine, Ser 1.70; Potassium 4.1; Sodium 141  No results found for requested labs within last 365 days.   CrCl cannot be calculated (Patient's most recent  lab result is older than the maximum 21 days allowed.).   Wt Readings from Last 3 Encounters:  08/12/22 232 lb (105.2 kg)  04/16/21 278 lb (126.1 kg)  01/15/20 280 lb (127 kg)     Other studies reviewed: Additional studies/records reviewed today include: summarized above  ASSESSMENT AND PLAN:  PPM *** Intact function *** Programming changes as above  persistent AFib CHA2DS2Vasc is 3, on Eliquis, *** appropriately dosed *** Unclear/unknown burden, no symptoms *** Labs today for his eliquis  HTN *** looks OK   4. Secondary hypercoagulable state 2/2 AFib   Disposition: ***    Current medicines are reviewed at length with the patient today.  The patient did not have any concerns regarding medicines.  Norma Fredrickson, PA-C 02/19/2023 10:58 AM     [facility] [address] [place] [address] [phone] (office)  [phone] (fax)

## 2023-02-21 ENCOUNTER — Ambulatory Visit: Payer: Medicare Other | Attending: Physician Assistant | Admitting: Physician Assistant

## 2023-03-02 NOTE — Progress Notes (Signed)
Remote pacemaker transmission.   

## 2023-04-13 ENCOUNTER — Ambulatory Visit: Payer: Medicare Other

## 2023-05-17 ENCOUNTER — Ambulatory Visit: Payer: Medicare Other

## 2023-07-13 ENCOUNTER — Ambulatory Visit: Payer: Medicare Other

## 2023-07-27 ENCOUNTER — Ambulatory Visit: Payer: Medicare Other | Admitting: Orthopedic Surgery

## 2023-08-16 ENCOUNTER — Ambulatory Visit (INDEPENDENT_AMBULATORY_CARE_PROVIDER_SITE_OTHER): Payer: Medicare Other

## 2023-08-16 DIAGNOSIS — I441 Atrioventricular block, second degree: Secondary | ICD-10-CM

## 2023-08-18 LAB — CUP PACEART REMOTE DEVICE CHECK
Battery Remaining Longevity: 89 mo
Battery Voltage: 2.99 V
Brady Statistic AS VP Percent: 83.93 %
Brady Statistic AS VS Percent: 15.98 %
Brady Statistic RV Percent Paced: 83.99 %
Date Time Interrogation Session: 20250320091549
Implantable Pulse Generator Implant Date: 20210817
Lead Channel Impedance Value: 470 Ohm
Lead Channel Pacing Threshold Amplitude: 0.75 V
Lead Channel Pacing Threshold Pulse Width: 0.24 ms
Lead Channel Sensing Intrinsic Amplitude: 7.65 mV
Lead Channel Setting Pacing Amplitude: 1.25 V
Lead Channel Setting Pacing Pulse Width: 0.24 ms
Lead Channel Setting Sensing Sensitivity: 2 mV

## 2023-09-25 ENCOUNTER — Other Ambulatory Visit: Payer: Self-pay | Admitting: Cardiology

## 2023-09-25 DIAGNOSIS — I480 Paroxysmal atrial fibrillation: Secondary | ICD-10-CM

## 2023-09-26 NOTE — Telephone Encounter (Signed)
 Prescription refill request for Eliquis  received. Indication:afib Last office visit:upcoming Scr:1.70  6/24 Age: 82 Weight:105.2  kg  Prescription refilled

## 2023-09-30 NOTE — Progress Notes (Signed)
 Remote pacemaker transmission.

## 2023-09-30 NOTE — Addendum Note (Signed)
 Addended by: Lott Rouleau A on: 09/30/2023 02:43 PM   Modules accepted: Orders

## 2023-10-11 ENCOUNTER — Ambulatory Visit: Admitting: Cardiology

## 2023-10-12 ENCOUNTER — Ambulatory Visit: Payer: Medicare Other

## 2023-11-15 ENCOUNTER — Ambulatory Visit (INDEPENDENT_AMBULATORY_CARE_PROVIDER_SITE_OTHER): Payer: Medicare Other

## 2023-11-15 DIAGNOSIS — I441 Atrioventricular block, second degree: Secondary | ICD-10-CM | POA: Diagnosis not present

## 2023-11-16 ENCOUNTER — Ambulatory Visit: Admitting: Orthopedic Surgery

## 2023-11-18 ENCOUNTER — Ambulatory Visit: Payer: Self-pay | Admitting: Cardiology

## 2023-11-18 LAB — CUP PACEART REMOTE DEVICE CHECK
Battery Remaining Longevity: 88 mo
Battery Voltage: 2.98 V
Brady Statistic AS VP Percent: 83.49 %
Brady Statistic AS VS Percent: 16.43 %
Brady Statistic RV Percent Paced: 83.55 %
Date Time Interrogation Session: 20250619100049
Implantable Pulse Generator Implant Date: 20210817
Lead Channel Impedance Value: 530 Ohm
Lead Channel Pacing Threshold Amplitude: 0.75 V
Lead Channel Pacing Threshold Pulse Width: 0.24 ms
Lead Channel Sensing Intrinsic Amplitude: 8.1 mV
Lead Channel Setting Pacing Amplitude: 1.375
Lead Channel Setting Pacing Pulse Width: 0.24 ms
Lead Channel Setting Sensing Sensitivity: 2 mV

## 2023-11-19 ENCOUNTER — Other Ambulatory Visit: Payer: Self-pay | Admitting: Student

## 2023-12-21 ENCOUNTER — Ambulatory Visit: Admitting: Orthopedic Surgery

## 2024-01-11 ENCOUNTER — Ambulatory Visit: Payer: Medicare Other

## 2024-01-17 ENCOUNTER — Other Ambulatory Visit: Payer: Self-pay | Admitting: Cardiology

## 2024-01-17 DIAGNOSIS — I480 Paroxysmal atrial fibrillation: Secondary | ICD-10-CM

## 2024-01-17 NOTE — Telephone Encounter (Signed)
 Prescription refill request for Eliquis  received. Indication:afib Last office visit:needs appt Scr:na Age: 83 Weight:105.2  kg  Prescription refilled

## 2024-01-19 ENCOUNTER — Ambulatory Visit: Admitting: Cardiology

## 2024-01-26 NOTE — Progress Notes (Signed)
 Remote pacemaker transmission.

## 2024-01-26 NOTE — Addendum Note (Signed)
 Addended by: VICCI SELLER A on: 01/26/2024 11:33 AM   Modules accepted: Orders

## 2024-02-14 ENCOUNTER — Ambulatory Visit: Payer: Medicare Other

## 2024-03-19 ENCOUNTER — Ambulatory Visit: Admitting: Orthopedic Surgery

## 2024-03-21 ENCOUNTER — Ambulatory Visit

## 2024-03-21 DIAGNOSIS — I441 Atrioventricular block, second degree: Secondary | ICD-10-CM | POA: Diagnosis not present

## 2024-03-22 LAB — CUP PACEART REMOTE DEVICE CHECK
Battery Remaining Longevity: 87 mo
Battery Voltage: 2.98 V
Brady Statistic AS VP Percent: 85.15 %
Brady Statistic AS VS Percent: 14.77 %
Brady Statistic RV Percent Paced: 85.2 %
Date Time Interrogation Session: 20251022062449
Implantable Pulse Generator Implant Date: 20210817
Lead Channel Impedance Value: 510 Ohm
Lead Channel Pacing Threshold Amplitude: 0.75 V
Lead Channel Pacing Threshold Pulse Width: 0.24 ms
Lead Channel Sensing Intrinsic Amplitude: 9.788 mV
Lead Channel Setting Pacing Amplitude: 1.375
Lead Channel Setting Pacing Pulse Width: 0.24 ms
Lead Channel Setting Sensing Sensitivity: 2 mV

## 2024-03-23 ENCOUNTER — Ambulatory Visit: Payer: Self-pay | Admitting: Cardiology

## 2024-03-23 NOTE — Progress Notes (Signed)
 Remote PPM Transmission

## 2024-04-02 ENCOUNTER — Encounter: Payer: Self-pay | Admitting: Radiology

## 2024-04-11 ENCOUNTER — Ambulatory Visit: Payer: Medicare Other

## 2024-05-01 ENCOUNTER — Other Ambulatory Visit: Payer: Self-pay | Admitting: Cardiology

## 2024-05-01 DIAGNOSIS — I480 Paroxysmal atrial fibrillation: Secondary | ICD-10-CM

## 2024-05-02 NOTE — Telephone Encounter (Signed)
 Prescription refill request for Eliquis  received. Indication:AFIB Last office visit:needs appt Scr:  Age: Weight: Prescription refilled

## 2024-05-15 ENCOUNTER — Encounter

## 2024-06-08 ENCOUNTER — Other Ambulatory Visit: Payer: Self-pay

## 2024-06-08 DIAGNOSIS — I480 Paroxysmal atrial fibrillation: Secondary | ICD-10-CM

## 2024-06-20 ENCOUNTER — Ambulatory Visit: Attending: Internal Medicine

## 2024-06-20 DIAGNOSIS — I480 Paroxysmal atrial fibrillation: Secondary | ICD-10-CM | POA: Diagnosis not present

## 2024-06-24 ENCOUNTER — Other Ambulatory Visit: Payer: Self-pay | Admitting: Cardiology

## 2024-06-24 DIAGNOSIS — I480 Paroxysmal atrial fibrillation: Secondary | ICD-10-CM

## 2024-06-25 LAB — CUP PACEART REMOTE DEVICE CHECK
Battery Remaining Longevity: 86 mo
Battery Voltage: 2.97 V
Brady Statistic AS VP Percent: 86.29 %
Brady Statistic AS VS Percent: 13.61 %
Brady Statistic RV Percent Paced: 86.35 %
Date Time Interrogation Session: 20260123115649
Implantable Pulse Generator Implant Date: 20210817
Lead Channel Impedance Value: 520 Ohm
Lead Channel Pacing Threshold Amplitude: 0.875 V
Lead Channel Pacing Threshold Pulse Width: 0.24 ms
Lead Channel Sensing Intrinsic Amplitude: 11.25 mV
Lead Channel Setting Pacing Amplitude: 1.375
Lead Channel Setting Pacing Pulse Width: 0.24 ms
Lead Channel Setting Sensing Sensitivity: 2 mV

## 2024-06-25 NOTE — Progress Notes (Signed)
 Remote PPM Transmission

## 2024-06-26 ENCOUNTER — Ambulatory Visit: Payer: Self-pay | Admitting: Cardiology

## 2024-08-14 ENCOUNTER — Encounter

## 2024-09-19 ENCOUNTER — Ambulatory Visit

## 2024-11-13 ENCOUNTER — Encounter

## 2024-12-19 ENCOUNTER — Ambulatory Visit

## 2025-02-12 ENCOUNTER — Encounter

## 2025-03-20 ENCOUNTER — Ambulatory Visit

## 2025-06-19 ENCOUNTER — Ambulatory Visit
